# Patient Record
Sex: Male | Born: 1950 | Race: White | Hispanic: No | Marital: Married | Smoking: Never smoker
Health system: Southern US, Community
[De-identification: ages and names within clinical notes are randomized; demographics above are authoritative.]

## PROBLEM LIST (undated history)

## (undated) DIAGNOSIS — N320 Bladder-neck obstruction: Secondary | ICD-10-CM

## (undated) DIAGNOSIS — M1990 Unspecified osteoarthritis, unspecified site: Secondary | ICD-10-CM

## (undated) DIAGNOSIS — R399 Unspecified symptoms and signs involving the genitourinary system: Secondary | ICD-10-CM

## (undated) DIAGNOSIS — R972 Elevated prostate specific antigen [PSA]: Secondary | ICD-10-CM

## (undated) DIAGNOSIS — Z8551 Personal history of malignant neoplasm of bladder: Secondary | ICD-10-CM

## (undated) DIAGNOSIS — Z77090 Contact with and (suspected) exposure to asbestos: Secondary | ICD-10-CM

## (undated) DIAGNOSIS — I4510 Unspecified right bundle-branch block: Secondary | ICD-10-CM

## (undated) DIAGNOSIS — N400 Enlarged prostate without lower urinary tract symptoms: Secondary | ICD-10-CM

## (undated) DIAGNOSIS — I10 Essential (primary) hypertension: Secondary | ICD-10-CM

## (undated) DIAGNOSIS — K219 Gastro-esophageal reflux disease without esophagitis: Secondary | ICD-10-CM

## (undated) HISTORY — PX: ULNAR TUNNEL RELEASE: SHX820

## (undated) HISTORY — PX: TONSILLECTOMY: SUR1361

## (undated) HISTORY — PX: APPENDECTOMY: SHX54

---

## 1997-11-03 ENCOUNTER — Ambulatory Visit (HOSPITAL_COMMUNITY): Admission: RE | Admit: 1997-11-03 | Discharge: 1997-11-03 | Payer: Self-pay | Admitting: Internal Medicine

## 2004-12-14 ENCOUNTER — Ambulatory Visit (HOSPITAL_COMMUNITY): Admission: RE | Admit: 2004-12-14 | Discharge: 2004-12-14 | Payer: Self-pay | Admitting: Orthopedic Surgery

## 2005-11-14 ENCOUNTER — Ambulatory Visit: Payer: Self-pay | Admitting: Gastroenterology

## 2005-11-29 ENCOUNTER — Ambulatory Visit: Payer: Self-pay | Admitting: Gastroenterology

## 2008-07-01 ENCOUNTER — Emergency Department (HOSPITAL_COMMUNITY): Admission: EM | Admit: 2008-07-01 | Discharge: 2008-07-01 | Payer: Self-pay | Admitting: Emergency Medicine

## 2010-01-18 ENCOUNTER — Ambulatory Visit: Payer: Self-pay | Admitting: Oncology

## 2010-12-08 ENCOUNTER — Other Ambulatory Visit: Payer: Self-pay | Admitting: Orthopedic Surgery

## 2010-12-08 DIAGNOSIS — M25521 Pain in right elbow: Secondary | ICD-10-CM

## 2010-12-10 ENCOUNTER — Other Ambulatory Visit: Payer: Self-pay

## 2010-12-12 ENCOUNTER — Ambulatory Visit
Admission: RE | Admit: 2010-12-12 | Discharge: 2010-12-12 | Disposition: A | Discharge status: Home / Self Care | Payer: 59 | Source: Ambulatory Visit | Attending: Orthopedic Surgery | Admitting: Orthopedic Surgery

## 2010-12-12 DIAGNOSIS — M25521 Pain in right elbow: Secondary | ICD-10-CM

## 2010-12-12 MED ORDER — GADOBENATE DIMEGLUMINE 529 MG/ML IV SOLN
16.0000 mL | Freq: Once | INTRAVENOUS | Status: AC | PRN
  Administered 2010-12-12: 16 mL via INTRAVENOUS

## 2010-12-29 ENCOUNTER — Ambulatory Visit: Payer: 59 | Admitting: Psychology

## 2011-05-23 HISTORY — PX: WRIST SURGERY: SHX841

## 2011-09-22 ENCOUNTER — Ambulatory Visit (INDEPENDENT_AMBULATORY_CARE_PROVIDER_SITE_OTHER): Payer: 59 | Admitting: Urology

## 2011-09-22 DIAGNOSIS — Z8551 Personal history of malignant neoplasm of bladder: Secondary | ICD-10-CM

## 2011-09-22 DIAGNOSIS — R972 Elevated prostate specific antigen [PSA]: Secondary | ICD-10-CM

## 2011-10-20 ENCOUNTER — Ambulatory Visit: Payer: 59 | Admitting: Urology

## 2012-01-09 ENCOUNTER — Other Ambulatory Visit (HOSPITAL_COMMUNITY): Payer: Self-pay

## 2012-01-09 ENCOUNTER — Other Ambulatory Visit: Payer: Self-pay | Admitting: Urology

## 2012-01-09 DIAGNOSIS — C679 Malignant neoplasm of bladder, unspecified: Secondary | ICD-10-CM

## 2012-01-11 ENCOUNTER — Ambulatory Visit (HOSPITAL_COMMUNITY)
Admission: RE | Admit: 2012-01-11 | Discharge: 2012-01-11 | Disposition: A | Discharge status: Home / Self Care | Payer: 59 | Source: Ambulatory Visit | Attending: Urology | Admitting: Urology

## 2012-01-11 DIAGNOSIS — N400 Enlarged prostate without lower urinary tract symptoms: Secondary | ICD-10-CM | POA: Insufficient documentation

## 2012-01-11 DIAGNOSIS — C679 Malignant neoplasm of bladder, unspecified: Secondary | ICD-10-CM | POA: Insufficient documentation

## 2012-01-11 MED ORDER — IOHEXOL 300 MG/ML  SOLN
100.0000 mL | Freq: Once | INTRAMUSCULAR | Status: AC | PRN
  Administered 2012-01-11: 100 mL via INTRAVENOUS

## 2012-01-26 ENCOUNTER — Ambulatory Visit (INDEPENDENT_AMBULATORY_CARE_PROVIDER_SITE_OTHER): Payer: 59 | Admitting: Urology

## 2012-01-26 DIAGNOSIS — Z8551 Personal history of malignant neoplasm of bladder: Secondary | ICD-10-CM

## 2012-01-26 DIAGNOSIS — N529 Male erectile dysfunction, unspecified: Secondary | ICD-10-CM

## 2012-01-26 DIAGNOSIS — R972 Elevated prostate specific antigen [PSA]: Secondary | ICD-10-CM

## 2012-03-25 ENCOUNTER — Emergency Department (HOSPITAL_COMMUNITY)
Admission: EM | Admit: 2012-03-25 | Discharge: 2012-03-25 | Disposition: A | Discharge status: Home / Self Care | Payer: 59 | Attending: Emergency Medicine | Admitting: Emergency Medicine

## 2012-03-25 ENCOUNTER — Encounter (HOSPITAL_COMMUNITY): Payer: Self-pay | Admitting: Neurology

## 2012-03-25 DIAGNOSIS — Y929 Unspecified place or not applicable: Secondary | ICD-10-CM | POA: Insufficient documentation

## 2012-03-25 DIAGNOSIS — L0291 Cutaneous abscess, unspecified: Secondary | ICD-10-CM

## 2012-03-25 DIAGNOSIS — Y939 Activity, unspecified: Secondary | ICD-10-CM | POA: Insufficient documentation

## 2012-03-25 DIAGNOSIS — S51809A Unspecified open wound of unspecified forearm, initial encounter: Secondary | ICD-10-CM | POA: Insufficient documentation

## 2012-03-25 DIAGNOSIS — Z8614 Personal history of Methicillin resistant Staphylococcus aureus infection: Secondary | ICD-10-CM | POA: Insufficient documentation

## 2012-03-25 DIAGNOSIS — IMO0002 Reserved for concepts with insufficient information to code with codable children: Secondary | ICD-10-CM | POA: Insufficient documentation

## 2012-03-25 DIAGNOSIS — Y33XXXA Other specified events, undetermined intent, initial encounter: Secondary | ICD-10-CM | POA: Insufficient documentation

## 2012-03-25 DIAGNOSIS — Y93A9 Activity, other involving cardiorespiratory exercise: Secondary | ICD-10-CM | POA: Insufficient documentation

## 2012-03-25 MED ORDER — VANCOMYCIN HCL IN DEXTROSE 1-5 GM/200ML-% IV SOLN
1000.0000 mg | Freq: Once | INTRAVENOUS | Status: AC
  Administered 2012-03-25: 1000 mg via INTRAVENOUS
  Filled 2012-03-25: qty 200

## 2012-03-25 MED ORDER — CLINDAMYCIN HCL 150 MG PO CAPS: 300.0000 mg | ORAL_CAPSULE | Freq: Three times a day (TID) | ORAL | Status: DC

## 2012-03-25 MED ORDER — HYDROCODONE-ACETAMINOPHEN 5-500 MG PO TABS: 1.0000 | ORAL_TABLET | Freq: Four times a day (QID) | ORAL | Status: DC | PRN

## 2012-03-25 MED ORDER — SODIUM CHLORIDE 0.9 % IV SOLN
INTRAVENOUS | Status: DC
  Administered 2012-03-25: 10 mL via INTRAVENOUS

## 2012-03-25 NOTE — ED Notes (Signed)
Pt had boil lanced Friday to right forearm. Area is red, raised. Painful to touch. Reports "throbbing". Sensation intact. Area wrapped with gauze, drainage is red/yellow.

## 2012-03-25 NOTE — ED Provider Notes (Addendum)
History  This chart was scribed for Suzi Roots, MD by Shari Heritage. The patient was seen in room TR07C/TR07C. Patient's care was started at 0915.     CSN: [id number]  Arrival date & time 03/25/12  0908   First MD Initiated Contact with Patient 03/25/12 0915      Chief Complaint  Patient presents with  . arm infection      The history is provided by the patient. No language interpreter was used.   HPI Comments: Joshua Bowers is a 61 y.o. male who presents to the Emergency Department complaining of moderately, painful boils to the right forearm, right axilla and left lateral thigh. Patient reports fever. No chills, nausea or vomiting. Patient says that the boil on his forearm developed first, and he later noticed "bumps" under his right arm and on his left thigh. Patient states that he saw Dr. Sherryll Burger in [place] last week who diagnosed him with MRSA and placed him on Doxycycline which he has been taking for since Wednesday. On Friday, Dr. Sherryll Burger incised the boil to the right forearm, but patient feels that the area hasn't improved. Patient reports no other significant past medical history.  Hx mrsa, recurrent boils/abscesses. Pt does not feel ill. No nv. Pain to areas constant, dull, moderate, worse w palpation.   No past medical history on file.  No past surgical history on file.  No family history on file.  History  Substance Use Topics  . Smoking status: Not on file  . Smokeless tobacco: Not on file  . Alcohol Use: Not on file      Review of Systems  Constitutional: Positive for fever. Negative for chills.  Gastrointestinal: Negative for nausea and vomiting.  Skin: Positive for wound.  All other systems reviewed and are negative.    Allergies  Review of patient's allergies indicates not on file.  Home Medications  No current outpatient prescriptions on file.  BP 187/109  Pulse 78  Temp 98.3 F (36.8 C) (Oral)  Resp 16  SpO2 100%  Physical Exam  Nursing note  and vitals reviewed. Constitutional: He is oriented to person, place, and time. He appears well-developed and well-nourished.  HENT:  Head: Normocephalic and atraumatic.  Eyes: Conjunctivae normal are normal.  Neck: Neck supple.  Cardiovascular: Normal rate.   Pulmonary/Chest: Effort normal.  Abdominal: Soft. He exhibits no distension. There is no tenderness.  Musculoskeletal: Normal range of motion. He exhibits no edema.       See skin exam.  Neurological: He is alert and oriented to person, place, and time.  Skin: Skin is warm and dry.       2 cm diameter abscess right axilla. 5 cm diameter abscess dorsum right forearm. Surrounding erythema. Radial pulse 2+. No lymphangitis.  Psychiatric: He has a normal mood and affect. His behavior is normal.    ED Course  Procedures (including critical care time) DIAGNOSTIC STUDIES: Oxygen Saturation is 100% on room air, normal by my interpretation.    COORDINATION OF CARE: 9:35am- Patient informed of current plan for treatment and evaluation and agrees with plan at this time.       MDM  I personally performed the services described in this documentation, which was scribed in my presence. The recorded information has been reviewed and considered. Suzi Roots, MD  2 abscess requiring I and D, one right axilla, other right forearm.   Vancomycin iv.   INCISION AND DRAINAGE Performed by: Suzi Roots Consent: Verbal consent  obtained. Risks and benefits: risks, benefits and alternatives were discussed Type: abscess  Body area: right axilla and forearm  Anesthesia: local infiltration  Local anesthetic: lidocaine 2% w epinephrine  Anesthetic total: 8 ml  Complexity: complex. scalpel used to make incision/I and D.  Blunt dissection to break up loculations  Drainage: purulent  Drainage amount: moderate  Packing material: 1/4 in iodoform gauze  Patient tolerance: Patient tolerated the procedure well with no immediate  complications.   Sterile dressing.  Pt drove self to ed.   Will add clinda to abx coverage.   Recheck 2 days.         Suzi Roots, MD 03/25/12 1028  Suzi Roots, MD 04/15/12 [phone]

## 2012-03-25 NOTE — ED Notes (Signed)
Pt reporting on Friday had boil lanced on right forearm. Since then site is red, raised, draining yellow pus. Painful, throbbing.

## 2014-02-06 ENCOUNTER — Ambulatory Visit (INDEPENDENT_AMBULATORY_CARE_PROVIDER_SITE_OTHER): Payer: 59 | Admitting: Urology

## 2014-02-06 DIAGNOSIS — Z8551 Personal history of malignant neoplasm of bladder: Secondary | ICD-10-CM

## 2014-02-06 DIAGNOSIS — N529 Male erectile dysfunction, unspecified: Secondary | ICD-10-CM

## 2014-04-10 ENCOUNTER — Ambulatory Visit (INDEPENDENT_AMBULATORY_CARE_PROVIDER_SITE_OTHER): Payer: 59 | Admitting: Urology

## 2014-04-10 ENCOUNTER — Other Ambulatory Visit: Payer: Self-pay | Admitting: Urology

## 2014-04-10 DIAGNOSIS — N5201 Erectile dysfunction due to arterial insufficiency: Secondary | ICD-10-CM

## 2014-04-10 DIAGNOSIS — R972 Elevated prostate specific antigen [PSA]: Secondary | ICD-10-CM

## 2014-04-10 DIAGNOSIS — R351 Nocturia: Secondary | ICD-10-CM

## 2014-04-10 DIAGNOSIS — N401 Enlarged prostate with lower urinary tract symptoms: Secondary | ICD-10-CM

## 2014-04-29 ENCOUNTER — Ambulatory Visit (HOSPITAL_COMMUNITY)
Admission: RE | Admit: 2014-04-29 | Discharge: 2014-04-29 | Disposition: A | Discharge status: Home / Self Care | Payer: 59 | Source: Ambulatory Visit | Attending: Urology | Admitting: Urology

## 2014-04-29 ENCOUNTER — Other Ambulatory Visit (HOSPITAL_COMMUNITY): Payer: 59

## 2014-04-29 DIAGNOSIS — N400 Enlarged prostate without lower urinary tract symptoms: Secondary | ICD-10-CM | POA: Insufficient documentation

## 2014-04-29 DIAGNOSIS — Z8551 Personal history of malignant neoplasm of bladder: Secondary | ICD-10-CM | POA: Diagnosis not present

## 2014-04-29 DIAGNOSIS — R972 Elevated prostate specific antigen [PSA]: Secondary | ICD-10-CM

## 2014-04-29 LAB — POCT I-STAT, CHEM 8
BUN: 17 mg/dL (ref 6–23)
Calcium, Ion: 1.15 mmol/L (ref 1.13–1.30)
Chloride: 102 mEq/L (ref 96–112)
Creatinine, Ser: 1.1 mg/dL (ref 0.50–1.35)
Glucose, Bld: 96 mg/dL (ref 70–99)
HCT: 51 % (ref 39.0–52.0)
Hemoglobin: 17.3 g/dL — ABNORMAL HIGH (ref 13.0–17.0)
Potassium: 4.2 mEq/L (ref 3.7–5.3)
Sodium: 138 mEq/L (ref 137–147)
TCO2: 27 mmol/L (ref 0–100)

## 2014-04-29 MED ORDER — GADOBENATE DIMEGLUMINE 529 MG/ML IV SOLN
17.0000 mL | Freq: Once | INTRAVENOUS | Status: AC | PRN
  Administered 2014-04-29: 17 mL via INTRAVENOUS

## 2014-05-01 ENCOUNTER — Ambulatory Visit (INDEPENDENT_AMBULATORY_CARE_PROVIDER_SITE_OTHER): Payer: 59 | Admitting: Urology

## 2014-05-01 DIAGNOSIS — N401 Enlarged prostate with lower urinary tract symptoms: Secondary | ICD-10-CM

## 2014-05-01 DIAGNOSIS — R972 Elevated prostate specific antigen [PSA]: Secondary | ICD-10-CM

## 2014-05-01 DIAGNOSIS — R3912 Poor urinary stream: Secondary | ICD-10-CM

## 2014-05-01 DIAGNOSIS — N5201 Erectile dysfunction due to arterial insufficiency: Secondary | ICD-10-CM

## 2015-08-13 ENCOUNTER — Ambulatory Visit (INDEPENDENT_AMBULATORY_CARE_PROVIDER_SITE_OTHER): Payer: 59 | Admitting: Urology

## 2015-08-13 DIAGNOSIS — K4090 Unilateral inguinal hernia, without obstruction or gangrene, not specified as recurrent: Secondary | ICD-10-CM | POA: Diagnosis not present

## 2015-08-13 DIAGNOSIS — N5201 Erectile dysfunction due to arterial insufficiency: Secondary | ICD-10-CM

## 2015-08-13 DIAGNOSIS — R972 Elevated prostate specific antigen [PSA]: Secondary | ICD-10-CM

## 2015-08-13 DIAGNOSIS — R351 Nocturia: Secondary | ICD-10-CM

## 2015-08-13 DIAGNOSIS — N401 Enlarged prostate with lower urinary tract symptoms: Secondary | ICD-10-CM | POA: Diagnosis not present

## 2015-11-02 ENCOUNTER — Encounter: Payer: Self-pay | Admitting: Gastroenterology

## 2015-12-01 ENCOUNTER — Other Ambulatory Visit: Payer: Self-pay | Admitting: Surgery

## 2015-12-21 ENCOUNTER — Encounter (HOSPITAL_BASED_OUTPATIENT_CLINIC_OR_DEPARTMENT_OTHER): Payer: Self-pay | Admitting: *Deleted

## 2015-12-24 ENCOUNTER — Other Ambulatory Visit: Payer: Self-pay

## 2015-12-24 ENCOUNTER — Encounter (HOSPITAL_COMMUNITY)
Admission: RE | Admit: 2015-12-24 | Discharge: 2015-12-24 | Disposition: A | Discharge status: Home / Self Care | Payer: 59 | Source: Ambulatory Visit | Attending: Surgery | Admitting: Surgery

## 2015-12-24 NOTE — Progress Notes (Signed)
ekg reviewed by dr rob fitzgerald no further treatment need

## 2015-12-26 NOTE — H&P (Deleted)
  Joshua Bowers 12/17/2015 8:59 AM Location: [facility] Patient #: [id number] DOB: 02/12/1957 Married / Language: English / Race: White Male   History of Present Illness (Joshua Bowers A. Ninfa Linden MD; 12/17/2015 9:06 AM) The patient is a 65 year old male who presents with breast cancer. She is here for another long-term follow-up regarding her right breast cancer. She is now completed her radiation. She is doing very well and was no complaints.   Allergies Joshua Bowers, CMA; 12/17/2015 9:00 AM) Codeine Sulfate *ANALGESICS - OPIOID* Sulfamethoxazole *Sulfonamides  Medication History Joshua Bowers, CMA; 12/17/2015 9:00 AM) TraZODone HCl (50MG  Tablet, Oral) Active. Gabapentin (300MG  Capsule, Oral) Active. Lexapro (20MG  Tablet, Oral) Active. Atorvastatin Calcium (40MG  Tablet, Oral) Active. Benicar HCT (40-25MG  Tablet, Oral) Active. Escitalopram Oxalate (10MG  Tablet, Oral) Active. Estradiol (0.05MG /24HR Patch TW, Transdermal) Active. Levothyroxine Sodium (137MCG Tablet, Oral) Active. Progesterone Micronized (100MG  Capsule, Oral) Active. Fish Oil (1200MG  Capsule, Oral) Active. Medications Reconciled  Vitals Joshua Bowers CMA; 12/17/2015 9:01 AM) 12/17/2015 9:00 AM Weight: 218 lb Height: 68in Body Surface Area: 2.12 m Body Mass Index: 33.15 kg/m  Temp.: 98.58F(Temporal)  Pulse: 72 (Regular)  BP: 126/74 (Sitting, Left Arm, Standard)       Physical Exam (Joshua Bowers A. Ninfa Linden MD; 12/17/2015 9:06 AM) The physical exam findings are as follows: Note:On examination of the right breast, there is still mild erythema from the radiation. The Port-A-Cath site is well-healed    Assessment & Plan (Mykenzi Vanzile A. Ninfa Linden MD; 12/17/2015 9:06 AM) BREAST CANCER, STAGE 1, RIGHT (C50.911) Impression: As she has completed her radiation therapy, I will now schedule her for Port-A-Cath removal. I discussed the procedure with her in detail and she is eager to proceed.  Surgery will be scheduled

## 2015-12-27 ENCOUNTER — Encounter (HOSPITAL_BASED_OUTPATIENT_CLINIC_OR_DEPARTMENT_OTHER): Payer: Self-pay | Admitting: Anesthesiology

## 2015-12-27 ENCOUNTER — Ambulatory Visit (HOSPITAL_BASED_OUTPATIENT_CLINIC_OR_DEPARTMENT_OTHER)
Admission: RE | Admit: 2015-12-27 | Discharge: 2015-12-28 | Disposition: A | Discharge status: Home / Self Care | Payer: 59 | Source: Ambulatory Visit | Attending: Surgery | Admitting: Surgery

## 2015-12-27 ENCOUNTER — Ambulatory Visit (HOSPITAL_BASED_OUTPATIENT_CLINIC_OR_DEPARTMENT_OTHER): Payer: 59 | Admitting: Anesthesiology

## 2015-12-27 ENCOUNTER — Encounter (HOSPITAL_BASED_OUTPATIENT_CLINIC_OR_DEPARTMENT_OTHER)
Admission: RE | Disposition: A | Discharge status: Home / Self Care | Payer: Self-pay | Source: Ambulatory Visit | Attending: Surgery

## 2015-12-27 DIAGNOSIS — Z9889 Other specified postprocedural states: Secondary | ICD-10-CM

## 2015-12-27 DIAGNOSIS — I10 Essential (primary) hypertension: Secondary | ICD-10-CM | POA: Insufficient documentation

## 2015-12-27 DIAGNOSIS — Z8719 Personal history of other diseases of the digestive system: Secondary | ICD-10-CM

## 2015-12-27 DIAGNOSIS — K4090 Unilateral inguinal hernia, without obstruction or gangrene, not specified as recurrent: Secondary | ICD-10-CM | POA: Diagnosis present

## 2015-12-27 HISTORY — PX: INSERTION OF MESH: SHX5868

## 2015-12-27 HISTORY — PX: INGUINAL HERNIA REPAIR: SHX194

## 2015-12-27 HISTORY — DX: Essential (primary) hypertension: I10

## 2015-12-27 SURGERY — REPAIR, HERNIA, INGUINAL, ADULT
Anesthesia: General | Laterality: Right

## 2015-12-27 MED ORDER — FENTANYL CITRATE (PF) 100 MCG/2ML IJ SOLN
50.0000 ug | INTRAMUSCULAR | Status: DC | PRN
  Administered 2015-12-27: 50 ug via INTRAVENOUS

## 2015-12-27 MED ORDER — LACTATED RINGERS IV SOLN
INTRAVENOUS | Status: DC | PRN
  Administered 2015-12-27 (×2): via INTRAVENOUS

## 2015-12-27 MED ORDER — ONDANSETRON HCL 4 MG/2ML IJ SOLN
INTRAMUSCULAR | Status: AC
  Filled 2015-12-27: qty 2

## 2015-12-27 MED ORDER — CHLORHEXIDINE GLUCONATE CLOTH 2 % EX PADS: 6.0000 | MEDICATED_PAD | Freq: Once | CUTANEOUS | Status: DC

## 2015-12-27 MED ORDER — BUPIVACAINE HCL (PF) 0.5 % IJ SOLN
INTRAMUSCULAR | Status: AC
  Filled 2015-12-27: qty 30

## 2015-12-27 MED ORDER — LIDOCAINE HCL (CARDIAC) 20 MG/ML IV SOLN
INTRAVENOUS | Status: DC | PRN
  Administered 2015-12-27: 50 mg via INTRAVENOUS

## 2015-12-27 MED ORDER — FENTANYL CITRATE (PF) 100 MCG/2ML IJ SOLN
INTRAMUSCULAR | Status: DC | PRN
  Administered 2015-12-27: 100 ug via INTRAVENOUS

## 2015-12-27 MED ORDER — DEXAMETHASONE SODIUM PHOSPHATE 10 MG/ML IJ SOLN
INTRAMUSCULAR | Status: AC
  Filled 2015-12-27: qty 1

## 2015-12-27 MED ORDER — PROMETHAZINE HCL 25 MG/ML IJ SOLN
6.2500 mg | INTRAMUSCULAR | Status: DC | PRN
Start: 2015-12-27 — End: 2015-12-28

## 2015-12-27 MED ORDER — OXYCODONE HCL 5 MG PO TABS
ORAL_TABLET | ORAL | Status: AC
  Filled 2015-12-27: qty 1

## 2015-12-27 MED ORDER — FENTANYL CITRATE (PF) 100 MCG/2ML IJ SOLN
INTRAMUSCULAR | Status: AC
  Filled 2015-12-27: qty 2

## 2015-12-27 MED ORDER — GLYCOPYRROLATE 0.2 MG/ML IJ SOLN
INTRAMUSCULAR | Status: DC | PRN
  Administered 2015-12-27: 0.2 mg via INTRAVENOUS

## 2015-12-27 MED ORDER — ROCURONIUM BROMIDE 10 MG/ML (PF) SYRINGE
PREFILLED_SYRINGE | INTRAVENOUS | Status: AC
  Filled 2015-12-27: qty 10

## 2015-12-27 MED ORDER — MIDAZOLAM HCL 2 MG/2ML IJ SOLN
INTRAMUSCULAR | Status: AC
  Filled 2015-12-27: qty 2

## 2015-12-27 MED ORDER — MORPHINE SULFATE (PF) 2 MG/ML IV SOLN: 1.0000 mg | INTRAVENOUS | Status: DC | PRN

## 2015-12-27 MED ORDER — LIDOCAINE 2% (20 MG/ML) 5 ML SYRINGE
INTRAMUSCULAR | Status: AC
  Filled 2015-12-27: qty 5

## 2015-12-27 MED ORDER — SUGAMMADEX SODIUM 200 MG/2ML IV SOLN
INTRAVENOUS | Status: AC
  Filled 2015-12-27: qty 2

## 2015-12-27 MED ORDER — SODIUM CHLORIDE 0.9 % IV SOLN
250.0000 mL | INTRAVENOUS | Status: DC | PRN
Start: 2015-12-27 — End: 2015-12-28

## 2015-12-27 MED ORDER — SODIUM CHLORIDE FLUSH 0.9 % IV SOLN
INTRAVENOUS | Status: AC
  Filled 2015-12-27: qty 10

## 2015-12-27 MED ORDER — TAMSULOSIN HCL 0.4 MG PO CAPS
0.4000 mg | ORAL_CAPSULE | Freq: Every day | ORAL | Status: AC
  Administered 2015-12-27: 0.4 mg via ORAL
  Filled 2015-12-27: qty 1

## 2015-12-27 MED ORDER — BUPIVACAINE-EPINEPHRINE (PF) 0.5% -1:200000 IJ SOLN
INTRAMUSCULAR | Status: AC
  Filled 2015-12-27: qty 30

## 2015-12-27 MED ORDER — BUPIVACAINE-EPINEPHRINE (PF) 0.5% -1:200000 IJ SOLN
INTRAMUSCULAR | Status: DC | PRN
  Administered 2015-12-27: 30 mL via PERINEURAL

## 2015-12-27 MED ORDER — ACETAMINOPHEN 650 MG RE SUPP: 650.0000 mg | RECTAL | Status: DC | PRN

## 2015-12-27 MED ORDER — SODIUM CHLORIDE 0.9% FLUSH
3.0000 mL | Freq: Two times a day (BID) | INTRAVENOUS | Status: DC
  Administered 2015-12-27: 3 mL via INTRAVENOUS

## 2015-12-27 MED ORDER — SCOPOLAMINE 1 MG/3DAYS TD PT72: 1.0000 | MEDICATED_PATCH | Freq: Once | TRANSDERMAL | Status: DC | PRN

## 2015-12-27 MED ORDER — HYDROCODONE-ACETAMINOPHEN 5-325 MG PO TABS: 1.0000 | ORAL_TABLET | ORAL | Status: DC | PRN

## 2015-12-27 MED ORDER — SODIUM CHLORIDE 0.9% FLUSH: 3.0000 mL | INTRAVENOUS | Status: DC | PRN

## 2015-12-27 MED ORDER — OXYCODONE HCL 5 MG PO TABS
5.0000 mg | ORAL_TABLET | ORAL | Status: DC | PRN
  Administered 2015-12-27: 5 mg via ORAL
  Administered 2015-12-27 – 2015-12-28 (×2): 10 mg via ORAL
  Filled 2015-12-27 (×2): qty 2

## 2015-12-27 MED ORDER — LACTATED RINGERS IV SOLN
INTRAVENOUS | Status: DC
  Administered 2015-12-27: 14:00:00 via INTRAVENOUS

## 2015-12-27 MED ORDER — HYDROCODONE-ACETAMINOPHEN 5-325 MG PO TABS: 1.0000 | ORAL_TABLET | ORAL | 0 refills | Status: DC | PRN

## 2015-12-27 MED ORDER — BUPIVACAINE HCL (PF) 0.5 % IJ SOLN
INTRAMUSCULAR | Status: DC | PRN
  Administered 2015-12-27: 10 mL

## 2015-12-27 MED ORDER — MIDAZOLAM HCL 2 MG/2ML IJ SOLN
1.0000 mg | INTRAMUSCULAR | Status: DC | PRN
  Administered 2015-12-27: 2 mg via INTRAVENOUS

## 2015-12-27 MED ORDER — ACETAMINOPHEN 325 MG PO TABS: 650.0000 mg | ORAL_TABLET | ORAL | Status: DC | PRN

## 2015-12-27 MED ORDER — GLYCOPYRROLATE 0.2 MG/ML IJ SOLN: 0.2000 mg | Freq: Once | INTRAMUSCULAR | Status: DC | PRN

## 2015-12-27 MED ORDER — GLYCOPYRROLATE 0.2 MG/ML IV SOSY
PREFILLED_SYRINGE | INTRAVENOUS | Status: AC
  Filled 2015-12-27: qty 3

## 2015-12-27 MED ORDER — MIDAZOLAM HCL 5 MG/5ML IJ SOLN
INTRAMUSCULAR | Status: DC | PRN
  Administered 2015-12-27: 2 mg via INTRAVENOUS

## 2015-12-27 MED ORDER — DEXAMETHASONE SODIUM PHOSPHATE 4 MG/ML IJ SOLN
INTRAMUSCULAR | Status: DC | PRN
  Administered 2015-12-27: 10 mg via INTRAVENOUS

## 2015-12-27 MED ORDER — FENTANYL CITRATE (PF) 100 MCG/2ML IJ SOLN
25.0000 ug | INTRAMUSCULAR | Status: DC | PRN
  Administered 2015-12-27 (×3): 50 ug via INTRAVENOUS

## 2015-12-27 MED ORDER — PROPOFOL 10 MG/ML IV BOLUS
INTRAVENOUS | Status: DC | PRN
  Administered 2015-12-27: 200 mg via INTRAVENOUS

## 2015-12-27 MED ORDER — PROPOFOL 10 MG/ML IV BOLUS
INTRAVENOUS | Status: AC
  Filled 2015-12-27: qty 20

## 2015-12-27 MED ORDER — CEFAZOLIN SODIUM-DEXTROSE 2-4 GM/100ML-% IV SOLN
2.0000 g | INTRAVENOUS | Status: AC
  Administered 2015-12-27: 2 g via INTRAVENOUS

## 2015-12-27 MED ORDER — CEFAZOLIN SODIUM-DEXTROSE 2-4 GM/100ML-% IV SOLN
INTRAVENOUS | Status: AC
  Filled 2015-12-27: qty 100

## 2015-12-27 SURGICAL SUPPLY — 42 items
BLADE CLIPPER SURG (BLADE) ×2 IMPLANT
BLADE HEX COATED 2.75 (ELECTRODE) ×2 IMPLANT
BLADE SURG 15 STRL LF DISP TIS (BLADE) ×1 IMPLANT
BLADE SURG 15 STRL SS (BLADE) ×2
CANISTER SUCTION 1200CC (MISCELLANEOUS) IMPLANT
CHLORAPREP W/TINT 26ML (MISCELLANEOUS) ×2 IMPLANT
COVER BACK TABLE 60X90IN (DRAPES) ×2 IMPLANT
COVER MAYO STAND STRL (DRAPES) ×2 IMPLANT
DECANTER SPIKE VIAL GLASS SM (MISCELLANEOUS) IMPLANT
DRAIN PENROSE 1/2X12 LTX STRL (WOUND CARE) ×2 IMPLANT
DRAPE LAPAROTOMY 100X72 PEDS (DRAPES) ×2 IMPLANT
DRAPE UTILITY XL STRL (DRAPES) ×2 IMPLANT
ELECT REM PT RETURN 9FT ADLT (ELECTROSURGICAL) ×2
ELECTRODE REM PT RTRN 9FT ADLT (ELECTROSURGICAL) ×1 IMPLANT
GLOVE SURG SIGNA 7.5 PF LTX (GLOVE) ×2 IMPLANT
GOWN STRL REUS W/ TWL LRG LVL3 (GOWN DISPOSABLE) ×1 IMPLANT
GOWN STRL REUS W/ TWL XL LVL3 (GOWN DISPOSABLE) ×1 IMPLANT
GOWN STRL REUS W/TWL LRG LVL3 (GOWN DISPOSABLE) ×2
GOWN STRL REUS W/TWL XL LVL3 (GOWN DISPOSABLE) ×2
LIQUID BAND (GAUZE/BANDAGES/DRESSINGS) ×2 IMPLANT
MESH PARIETEX PROGRIP RIGHT (Mesh General) ×1 IMPLANT
NDL HYPO 25X1 1.5 SAFETY (NEEDLE) ×1 IMPLANT
NEEDLE HYPO 25X1 1.5 SAFETY (NEEDLE) ×2 IMPLANT
NS IRRIG 1000ML POUR BTL (IV SOLUTION) IMPLANT
PACK BASIN DAY SURGERY FS (CUSTOM PROCEDURE TRAY) ×2 IMPLANT
PENCIL BUTTON HOLSTER BLD 10FT (ELECTRODE) ×2 IMPLANT
SLEEVE SCD COMPRESS KNEE MED (MISCELLANEOUS) ×2 IMPLANT
SPONGE INTESTINAL PEANUT (DISPOSABLE) IMPLANT
SPONGE LAP 4X18 X RAY DECT (DISPOSABLE) ×2 IMPLANT
SUT MNCRL AB 4-0 PS2 18 (SUTURE) ×2 IMPLANT
SUT SILK 2 0 SH (SUTURE) ×1 IMPLANT
SUT SURG 0 T 19/GS 22 1969 62 (SUTURE) IMPLANT
SUT VIC AB 2-0 CT1 27 (SUTURE) ×4
SUT VIC AB 2-0 CT1 TAPERPNT 27 (SUTURE) ×2 IMPLANT
SUT VIC AB 3-0 CT1 27 (SUTURE) ×2
SUT VIC AB 3-0 CT1 27XBRD (SUTURE) ×1 IMPLANT
SYR BULB 3OZ (MISCELLANEOUS) IMPLANT
SYR CONTROL 10ML LL (SYRINGE) ×2 IMPLANT
TOWEL OR 17X24 6PK STRL BLUE (TOWEL DISPOSABLE) ×2 IMPLANT
TOWEL OR NON WOVEN STRL DISP B (DISPOSABLE) ×2 IMPLANT
TUBE CONNECTING 20X1/4 (TUBING) IMPLANT
YANKAUER SUCT BULB TIP NO VENT (SUCTIONS) IMPLANT

## 2015-12-27 NOTE — Anesthesia Postprocedure Evaluation (Addendum)
Anesthesia Post Note  Patient: Joshua Bowers  Procedure(s) Performed: Procedure(s) (LRB): HERNIA REPAIR RIGHT INGUINAL ADULT WITH MESH (Right) INSERTION OF MESH (Right)  Patient location during evaluation: PACU Anesthesia Type: General and Regional Level of consciousness: awake and alert Pain management: pain level controlled Vital Signs Assessment: post-procedure vital signs reviewed and stable Respiratory status: spontaneous breathing, nonlabored ventilation, respiratory function stable and patient connected to nasal cannula oxygen Cardiovascular status: blood pressure returned to baseline and stable Postop Assessment: no signs of nausea or vomiting Anesthetic complications: yes Anesthetic complication details: anesthesia complicationsComments: Persistent right quadriceps weakness after surgery. Unable to support weight. Knee immobilizer placed but patient still feels unsafe. Discussed likely cause is from TAP block (known complication) and that this should wear off overnight.  Called Dr. Ninfa Linden and discussed event.  He will write admission orders and patient will stay until he feels safe to go home.  Reassured patient and wife.  Questions answered.    Last Vitals:  Vitals:   12/27/15 1545 12/27/15 1620  BP: (!) 146/92 (!) 148/76  Pulse: 77 70  Resp: 19 18  Temp:  36.4 C    Last Pain:  Vitals:   12/27/15 1620  TempSrc:   PainSc: 5                  Raynelle Fujikawa J

## 2015-12-27 NOTE — Op Note (Signed)
HERNIA REPAIR RIGHT INGUINAL ADULT WITH MESH, INSERTION OF MESH  Procedure Note  AYDRIEN KAHELE 12/27/2015   Pre-op Diagnosis: Right inguinal hernia      Post-op Diagnosis: same  Procedure(s): HERNIA REPAIR RIGHT INGUINAL ADULT WITH MESH INSERTION OF MESH  Surgeon(s): [name], MD  Anesthesia: General  Staff:  Circulator: Ted Mcalpine, RN Relief Scrub: Gaetano Net, RN Scrub Person: Mickie Bail, CST Circulator Assistant: Philomena Doheny, RN  Estimated Blood Loss: Minimal                         [name] A   Date: 12/27/2015  Time: 2:45 PM

## 2015-12-27 NOTE — Interval H&P Note (Deleted)
History and Physical Interval Note:no change in H and P  12/27/2015 12:54 PM  Joshua Bowers  has presented today for surgery, with the diagnosis of Right inguinal hernia   The various methods of treatment have been discussed with the patient and family. After consideration of risks, benefits and other options for treatment, the patient has consented to  Procedure(s): HERNIA REPAIR RIGHT INGUINAL ADULT WITH MESH (Right) INSERTION OF MESH (Right) as a surgical intervention .  The patient's history has been reviewed, patient examined, no change in status, stable for surgery.  I have reviewed the patient's chart and labs.  Questions were answered to the patient's satisfaction.     Sajid Ruppert A

## 2015-12-27 NOTE — Anesthesia Procedure Notes (Signed)
Anesthesia Regional Block:  TAP block  Pre-Anesthetic Checklist: ,, timeout performed, Correct Patient, Correct Site, Correct Laterality, Correct Procedure, Correct Position, site marked, Risks and benefits discussed,  Surgical consent,  Pre-op evaluation,  At surgeon's request and post-op pain management  Laterality: Right  Prep: chloraprep       Needles:  Injection technique: Single-shot  Needle Type: Echogenic Needle     Needle Length: 10cm 10 cm Needle Gauge: 21 and 21 G    Additional Needles:  Procedures: ultrasound guided (picture in chart) TAP block Narrative:  Injection made incrementally with aspirations every 5 mL.  Performed by: Personally  Anesthesiologist: Franne Grip  Additional Notes: Tolerated well

## 2015-12-27 NOTE — Discharge Instructions (Signed)
[facility] _______[facility]  UMBILICAL OR INGUINAL HERNIA REPAIR: POST OP INSTRUCTIONS  Always review your discharge instruction sheet given to you by the facility where your surgery was performed. IF YOU HAVE DISABILITY OR FAMILY LEAVE FORMS, YOU MUST BRING THEM TO THE OFFICE FOR PROCESSING.   DO NOT GIVE THEM TO YOUR DOCTOR.  1. A  prescription for pain medication may be given to you upon discharge.  Take your pain medication as prescribed, if needed.  If narcotic pain medicine is not needed, then you may take acetaminophen (Tylenol) or ibuprofen (Advil) as needed. 2. Take your usually prescribed medications unless otherwise directed. 3. If you need a refill on your pain medication, please contact your pharmacy.  They will contact our office to request authorization. Prescriptions will not be filled after 5 pm or on week-ends.   Post Anesthesia Home Care Instructions  Activity: Get plenty of rest for the remainder of the day. A responsible adult should stay with you for 24 hours following the procedure.  For the next 24 hours, DO NOT: -Drive a car -Advertising copywriterperate machinery -Drink alcoholic beverages -Take any medication unless instructed by your physician -Make any legal decisions or sign important papers.  Meals: Start with liquid foods such as gelatin or soup. Progress to regular foods as tolerated. Avoid greasy, spicy, heavy foods. If nausea and/or vomiting occur, drink only clear liquids until the nausea and/or vomiting subsides. Call your physician if vomiting continues.  Special Instructions/Symptoms: Your throat may feel dry or sore from the anesthesia or the breathing tube placed in your throat during surgery. If this causes discomfort, gargle with warm salt water. The discomfort should disappear within 24 hours.  If you had a scopolamine patch placed behind your ear for the management of post- operative nausea and/or vomiting:  1. The medication in the patch is  effective for 72 hours, after which it should be removed.  Wrap patch in a tissue and discard in the trash. Wash hands thoroughly with soap and water. 2. You may remove the patch earlier than 72 hours if you experience unpleasant side effects which may include dry mouth, dizziness or visual disturbances. 3. Avoid touching the patch. Wash your hands with soap and water after contact with the patch.    4. You should follow a light diet the first 24 hours after arrival home, such as soup and crackers, etc.  Be sure to include lots of fluids daily.  Resume your normal diet the day after surgery. 5. Most patients will experience some swelling and bruising around the umbilicus or in the groin and scrotum.  Ice packs and reclining will help.  Swelling and bruising can take several days to resolve.  6. It is common to experience some constipation if taking pain medication after surgery.  Increasing fluid intake and taking a stool softener (such as Colace) will usually help or prevent this problem from occurring.  A mild laxative (Milk of Magnesia or Miralax) should be taken according to package directions if there are no bowel movements after 48 hours. 7. Unless discharge instructions indicate otherwise, you may remove your bandages 24-48 hours after surgery, and you may shower at that time.  You may have steri-strips (small skin tapes) in place directly over the incision.  These strips should be left on the skin for 7-10 days.  If your surgeon used skin glue on the incision, you may shower in 24 hours.  The glue will flake off over the next 2-3  weeks.  Any sutures or staples will be removed at the office during your follow-up visit. 8. ACTIVITIES:  You may resume regular (light) daily activities beginning the next day--such as daily self-care, walking, climbing stairs--gradually increasing activities as tolerated.  You may have sexual intercourse when it is comfortable.  Refrain from any heavy lifting or straining  until approved by your doctor. a. You may drive when you are no longer taking prescription pain medication, you can comfortably wear a seatbelt, and you can safely maneuver your car and apply brakes. b. RETURN TO WORK:  __________________________________________________________ 9. You should see your doctor in the office for a follow-up appointment approximately 2-3 weeks after your surgery.  Make sure that you call for this appointment within a day or two after you arrive home to insure a convenient appointment time. 10. OTHER INSTRUCTIONS: NO LIFTING MORE THAN 15 POUNDS FOR 4 WEEKS 11. ICE PACK AND IBUPROFEN ALSO FOR PAIN __________________________________________________________________________________________________________________________________________________________________________________________  WHEN TO CALL YOUR DOCTOR: 1. Fever over 101.0 2. Inability to urinate 3. Nausea and/or vomiting 4. Extreme swelling or bruising 5. Continued bleeding from incision. 6. Increased pain, redness, or drainage from the incision  The clinic staff is available to answer your questions during regular business hours.  Please dont hesitate to call and ask to speak to one of the nurses for clinical concerns.  If you have a medical emergency, go to the nearest emergency room or call 911.  A surgeon from [facility] is always on call at the hospital   [address] [place], [place], [place]  [address] ?  [address], [place], [place]   [address] [phone] ? [phone] ? FAX [phone] Web site: [email or web address]

## 2015-12-27 NOTE — Anesthesia Procedure Notes (Signed)
Anesthesia Procedure Image    

## 2015-12-27 NOTE — Op Note (Signed)
[name], [name]               ACCOUNT NO.:  [id number]  MEDICAL RECORD NO.:  [id number]  LOCATION:  DOIB                          FACILITY:  [facility]  PHYSICIAN:  [name], M.D. DATE OF BIRTH:  Oct 16, 1950  DATE OF PROCEDURE:  12/27/2015 DATE OF DISCHARGE:  12/24/2015                              OPERATIVE REPORT   PREOPERATIVE DIAGNOSIS:  Right inguinal hernia.  POSTOPERATIVE DIAGNOSIS:  Right inguinal hernia.  PROCEDURE:  Right inguinal hernia repair with mesh.  SURGEON:  [name], M.D.  ANESTHESIA:  General with TAP block, provided by Anesthesia.  ESTIMATED BLOOD LOSS:  Minimal.  FINDINGS:  The patient was found to have an indirect right inguinal hernia, which was repaired with a piece of Proceed Prolene ProGrip mesh.  PROCEDURE IN DETAIL:  The patient was brought to the operating room, identified as Joshua Bowers.  He was placed supine on the operating table, general anesthesia was induced.  His abdomen, right lower quadrant prepped and draped in usual sterile fashion.  I anesthetized skin in the right lower quadrant with Marcaine.  I made a longitudinal incision in the groin with a scalpel.  I took this down through Scarpa's fascia with electrocautery.  The external oblique fascia was then identified and opened through the internal and external rings.  The patient had a large indirect hernia sac along the cord structures.  I was able to control the cord structures with a Penrose drain.  I was able to separate the cord from the hernia sac.  I dissected the hernia sac down to the base.  All contents were reduced.  I tied off the sac with a 2-0 silk suture.  I then excised the redundant sac with the cautery.  Next, a piece of ProGrip Prolene mesh was brought onto the field.  A right-sided large piece of mesh was used.  I placed it against the pubic tubercle and sutured in place with a 2-0 Vicryl suture.  I then brought it around the cord structures and  fastened in place.  Wide coverage of the inguinal floor and internal ring appeared to be achieved.  At this point, I then closed the external oblique fascia over the top of the mesh with a running 2-0 Vicryl suture.  Scarpa's fascia was then closed with interrupted 3-0 Vicryl sutures, and the skin was closed with a running 4-0 Monocryl.  Skin glue was then applied.  The patient tolerated the procedure well.  All sponge, needle, and instrument counts Were correct at the end of the procedure.  The patient was then extubated in the operating room and taken in stable condition to recovery room.     [name], M.D.     DB/MEDQ  D:  12/27/2015  T:  12/27/2015  Job:  [id number]

## 2015-12-27 NOTE — Progress Notes (Signed)
When attempting to get patient up to recliner her had weakness in right leg and had to sit back down on stretcher.  Tried once more using a walker for support and was still unable to walk.  Helped patient back to stretcher and called Dr. Delma Post.  He advised that it was probably due to the block and give it some more time.  Patient remained on stretcher and placed in PACU bay 12.  Wife at bedside.  Wife looked at abdomen with me and agreed that abdomen on right side looked "swollen".  Called Dr. Delma Post again.  He advised he will be by to see patient.

## 2015-12-27 NOTE — Progress Notes (Signed)
Assisted Dr. Delma Post with right, ultrasound guided, transabdominal plane block. Side rails up, monitors on throughout procedure. See vital signs in flow sheet. Tolerated Procedure well.

## 2015-12-27 NOTE — Transfer of Care (Signed)
Immediate Anesthesia Transfer of Care Note  Patient: Joshua Bowers  Procedure(s) Performed: Procedure(s): HERNIA REPAIR RIGHT INGUINAL ADULT WITH MESH (Right) INSERTION OF MESH (Right)  Patient Location: PACU  Anesthesia Type:GA combined with regional for post-op pain  Level of Consciousness: awake and patient cooperative  Airway & Oxygen Therapy: Patient Spontanous Breathing and Patient connected to face mask oxygen  Post-op Assessment: Report given to RN and Post -op Vital signs reviewed and stable  Post vital signs: Reviewed and stable  Last Vitals:  Vitals:   12/27/15 1355 12/27/15 1400  BP: (!) 141/87 (!) 142/84  Pulse: 68 61  Resp: 15   Temp:      Last Pain:  Vitals:   12/27/15 1322  TempSrc: Oral         Complications: No apparent anesthesia complications

## 2015-12-27 NOTE — Anesthesia Preprocedure Evaluation (Addendum)
Anesthesia Evaluation  Patient identified by MRN, date of birth, ID band Patient awake and Patient unresponsive    Reviewed: Allergy & Precautions, NPO status , Patient's Chart, lab work & pertinent test results  Airway Mallampati: II  TM Distance: >3 FB Neck ROM: Full    Dental no notable dental hx.    Pulmonary neg pulmonary ROS,    Pulmonary exam normal breath sounds clear to auscultation       Cardiovascular hypertension, Pt. on medications Normal cardiovascular exam Rhythm:Regular Rate:Normal     Neuro/Psych negative neurological ROS  negative psych ROS   GI/Hepatic negative GI ROS, Neg liver ROS,   Endo/Other  negative endocrine ROS  Renal/GU negative Renal ROS  negative genitourinary   Musculoskeletal negative musculoskeletal ROS (+)   Abdominal   Peds negative pediatric ROS (+)  Hematology negative hematology ROS (+)   Anesthesia Other Findings   Reproductive/Obstetrics negative OB ROS                             Anesthesia Physical Anesthesia Plan  ASA: II  Anesthesia Plan: General   Post-op Pain Management: GA combined w/ Regional for post-op pain   Induction: Intravenous  Airway Management Planned: LMA  Additional Equipment:   Intra-op Plan:   Post-operative Plan: Extubation in OR  Informed Consent: I have reviewed the patients History and Physical, chart, labs and discussed the procedure including the risks, benefits and alternatives for the proposed anesthesia with the patient or authorized representative who has indicated his/her understanding and acceptance.   Dental advisory given  Plan Discussed with: CRNA  Anesthesia Plan Comments: (Discussed r/b TAP block including bleeding, infection, failure, nerve damage.)       Anesthesia Quick Evaluation

## 2015-12-27 NOTE — Anesthesia Procedure Notes (Signed)
Procedure Name: LMA Insertion Date/Time: 12/27/2015 2:14 PM Performed by: Toula Moos L Pre-anesthesia Checklist: Patient identified, Emergency Drugs available, Suction available, Patient being monitored and Timeout performed Patient Re-evaluated:Patient Re-evaluated prior to inductionOxygen Delivery Method: Circle system utilized Preoxygenation: Pre-oxygenation with 100% oxygen Intubation Type: IV induction Ventilation: Mask ventilation without difficulty LMA: LMA inserted LMA Size: 5.0 Number of attempts: 1 Airway Equipment and Method: Bite block Placement Confirmation: positive ETCO2 Tube secured with: Tape Dental Injury: Teeth and Oropharynx as per pre-operative assessment

## 2015-12-28 DIAGNOSIS — K4090 Unilateral inguinal hernia, without obstruction or gangrene, not specified as recurrent: Secondary | ICD-10-CM | POA: Diagnosis not present

## 2015-12-28 NOTE — Progress Notes (Signed)
Called Joshua Bowers for follow-up. He reports that his right leg numbness and weakness started to wear off about 0200 today. He was able to walk out of the [facility] early this morning. He was seen by Dr. Ninfa Linden before discharge. He reports that he is currently sore from his surgery but doing well overall. We discussed the probable cause of his leg weakness. Questions answered.

## 2015-12-28 NOTE — Discharge Summary (Signed)
Physician Discharge Summary  Patient ID: Joshua Bowers MRN: [id number] DOB/AGE: 1951/05/21 65 y.o.  Admit date: 12/27/2015 Discharge date: 12/28/2015  Admission Diagnoses:  Discharge Diagnoses:  Active Problems:   S/P hernia repair   Discharged Condition: good  Hospital Course: admitted post op at request of anesthesia secondary to right leg weakness after TAP block.  Improved and discharge POD#1  Consults: None  Significant Diagnostic Studies:   Treatments: surgery: right inguinal hernia repair with mesh  Discharge Exam: Blood pressure 133/84, pulse 88, temperature 98.1 F (36.7 C), resp. rate 18, height 6' (1.829 m), weight 81.2 kg (179 lb), SpO2 96 %. General appearance: alert, cooperative and no distress Incision/Wound:clean Less leg weakness  Disposition: 01-Home or Self Care     Medication List    TAKE these medications   amLODipine 5 MG tablet Commonly known as:  NORVASC Take 5 mg by mouth daily.   aspirin 325 MG EC tablet Take 325 mg by mouth daily.   FISH OIL PO Take 1 capsule by mouth daily.   HYDROcodone-acetaminophen 5-325 MG tablet Commonly known as:  NORCO Take 1-2 tablets by mouth every 4 (four) hours as needed for moderate pain.   losartan 50 MG tablet Commonly known as:  COZAAR Take 50 mg by mouth daily.   multivitamin with minerals Tabs tablet Take 1 tablet by mouth daily.   tamsulosin 0.4 MG Caps capsule Commonly known as:  FLOMAX Take 0.4 mg by mouth.   VITAMIN B 12 PO Take 1 tablet by mouth daily.      Follow-up Information    Royelle Hinchman A, MD. Schedule an appointment as soon as possible for a visit in 2 weeks.   Specialty:  General Surgery Contact information: [address] [place] [address] [phone]           Signed: Harl Bowie 12/28/2015, 6:37 AM

## 2015-12-28 NOTE — Progress Notes (Signed)
Patient ID: Joshua Bowers, male   DOB: 1950-09-30, 65 y.o.   MRN: [id number]   Feeling better Less leg weakness  Plan: discharge

## 2015-12-29 ENCOUNTER — Encounter (HOSPITAL_BASED_OUTPATIENT_CLINIC_OR_DEPARTMENT_OTHER): Payer: Self-pay | Admitting: Surgery

## 2016-01-25 ENCOUNTER — Encounter: Payer: Self-pay | Admitting: Gastroenterology

## 2016-03-06 NOTE — H&P (Signed)
Joshua Bowers  Location: [facility] Patient #: [id number] DOB: 16-Nov-1950 Married / Language: English / Race: White Male   History of Present Illness Joshua Canary A. Ninfa Linden MD;  The patient is a 65 year old male who presents with an inguinal hernia. This gentleman is referred by Dr. Monico Blitz for evaluation of a right inguinal hernia. The patient reports he has had for some time. It is slowly getting larger and causing some mild right groin discomfort. He denies any obstructive symptoms. He is otherwise without complaints. He doesn't moderate amount of heavy lifting at work.   Other Problems Davy Pique Bynum, CMA;  Bladder Problems Cancer High blood pressure  Past Surgical History (Sonya Bynum,  Appendectomy Tonsillectomy Vasectomy  Diagnostic Studies History Marjean Donna, CMA;  Colonoscopy 5-10 years ago  Allergies Marjean Donna, CMA;  No Known Drug Allergies07/04/2016  Medication History (Sonya Bynum, CMA;  AmLODIPine Besylate (5MG  Tablet, Oral) Active. Losartan Potassium (50MG  Tablet, Oral) Active. Tamsulosin HCl (0.4MG  Capsule, Oral) Active.  Social History Davy Pique Bynum, CMA;  Alcohol use Occasional alcohol use. Caffeine use Carbonated beverages, Coffee, Tea. No drug use Tobacco use Never smoker.  Family History Marjean Donna, [place]; Arthritis Father, Mother. Heart Disease Brother, Father. Heart disease in male family member before age 29 Hypertension Brother, Father, Mother. Melanoma Father. Prostate Cancer Father.    Review of Systems ([place];  General Not Present- Appetite Loss, Chills, Fatigue, Fever, Night Sweats, Weight Gain and Weight Loss. Skin Not Present- Change in Wart/Mole, Dryness, Hives, Jaundice, New Lesions, Non-Healing Wounds, Rash and Ulcer. HEENT Present- Seasonal Allergies. Not Present- Earache, Hearing Loss, Hoarseness, Nose Bleed, Oral Ulcers, Ringing in the Ears, Sinus Pain, Sore Throat, Visual  Disturbances, Wears glasses/contact lenses and Yellow Eyes. Respiratory Not Present- Bloody sputum, Chronic Cough, Difficulty Breathing, Snoring and Wheezing. Breast Not Present- Breast Mass, Breast Pain, Nipple Discharge and Skin Changes. Cardiovascular Not Present- Chest Pain, Difficulty Breathing Lying Down, Leg Cramps, Palpitations, Rapid Heart Rate, Shortness of Breath and Swelling of Extremities. Gastrointestinal Not Present- Abdominal Pain, Bloating, Bloody Stool, Change in Bowel Habits, Chronic diarrhea, Constipation, Difficulty Swallowing, Excessive gas, Gets full quickly at meals, Hemorrhoids, Indigestion, Nausea, Rectal Pain and Vomiting. Male Genitourinary Present- Frequency. Not Present- Blood in Urine, Change in Urinary Stream, Impotence, Nocturia, Painful Urination, Urgency and Urine Leakage. Musculoskeletal Present- Joint Pain. Not Present- Back Pain, Joint Stiffness, Muscle Pain, Muscle Weakness and Swelling of Extremities. Neurological Not Present- Decreased Memory, Fainting, Headaches, Numbness, Seizures, Tingling, Tremor, Trouble walking and Weakness. Psychiatric Not Present- Anxiety, Bipolar, Change in Sleep Pattern, Depression, Fearful and Frequent crying. Endocrine Not Present- Cold Intolerance, Excessive Hunger, Hair Changes, Heat Intolerance, Hot flashes and New Diabetes. Hematology Not Present- Blood Thinners, Easy Bruising, Excessive bleeding, Gland problems, HIV and Persistent Infections.  Vitals (Sonya Bynum CMA;  Weight: 180 lb Temp.: 97.55F(Temporal)  Pulse: 81 (Regular)  BP: 126/76 (Sitting, Left Arm, Standard)   Physical Exam (Quincee Gittens A. Ninfa Linden MD;  General Mental Status-Alert. General Appearance-Consistent with stated age. Hydration-Well hydrated. Voice-Normal.  Head and Neck Head-normocephalic, atraumatic with no lesions or palpable masses. Trachea-midline.  Eye Eyeball - Bilateral-Extraocular movements  intact. Sclera/Conjunctiva - Bilateral-No scleral icterus.  Chest and Lung Exam Chest and lung exam reveals -quiet, even and easy respiratory effort with no use of accessory muscles and on auscultation, normal breath sounds, no adventitious sounds and normal vocal resonance. Inspection Chest Wall - Normal. Back - normal.  Cardiovascular Cardiovascular examination reveals -normal heart sounds, regular rate and rhythm with  no murmurs and normal pedal pulses bilaterally.  Abdomen Inspection Skin - Scar - no surgical scars. Hernias - Inguinal hernia - Right - Reducible. Palpation/Percussion Palpation and Percussion of the abdomen reveal - Soft, Non Tender, No Rebound tenderness, No Rigidity (guarding) and No hepatosplenomegaly. Auscultation Auscultation of the abdomen reveals - Bowel sounds normal.  Neurologic - Did not examine.  Musculoskeletal - Did not examine.    Assessment & Plan (Tyjae Issa A. Ninfa Linden MD;  RIGHT INGUINAL HERNIA (K40.90) Impression: I discussed the diagnosis with him. I discussed inguinal hernia surgery with mesh. I discussed both the open and laparoscopic techniques. After discussion, he wishes to proceed with a right inguinal hernia repair with mesh. I discussed the risks which includes but is not limited to bleeding, infection, injury to surrounding structures, nerve entrapment, chronic pain, recurrent hernia, cardiopulmonary issues, postoperative recovery, etc. He understands and wishes to proceed with surgery

## 2016-04-10 ENCOUNTER — Ambulatory Visit (AMBULATORY_SURGERY_CENTER): Payer: Self-pay

## 2016-04-10 VITALS — Ht 72.0 in | Wt 180.0 lb

## 2016-04-10 DIAGNOSIS — Z1211 Encounter for screening for malignant neoplasm of colon: Secondary | ICD-10-CM

## 2016-04-10 MED ORDER — SUPREP BOWEL PREP KIT 17.5-3.13-1.6 GM/177ML PO SOLN: 1.0000 | Freq: Once | ORAL | 0 refills | Status: DC

## 2016-04-10 MED ORDER — SUPREP BOWEL PREP KIT 17.5-3.13-1.6 GM/177ML PO SOLN: 1.0000 | Freq: Once | ORAL | 0 refills | Status: AC

## 2016-04-10 NOTE — Progress Notes (Signed)
No allergies to eggs or soy No past problems with anesthesia No diet meds No home oxygen  Declined emmi 

## 2016-04-28 ENCOUNTER — Encounter: Payer: Self-pay | Admitting: Gastroenterology

## 2016-04-28 ENCOUNTER — Ambulatory Visit (AMBULATORY_SURGERY_CENTER): Payer: 59 | Admitting: Gastroenterology

## 2016-04-28 VITALS — BP 134/83 | HR 70 | Temp 97.5°F | Resp 16 | Ht 72.0 in | Wt 180.0 lb

## 2016-04-28 DIAGNOSIS — D120 Benign neoplasm of cecum: Secondary | ICD-10-CM | POA: Diagnosis not present

## 2016-04-28 DIAGNOSIS — K635 Polyp of colon: Secondary | ICD-10-CM

## 2016-04-28 DIAGNOSIS — D125 Benign neoplasm of sigmoid colon: Secondary | ICD-10-CM

## 2016-04-28 DIAGNOSIS — Z1212 Encounter for screening for malignant neoplasm of rectum: Secondary | ICD-10-CM | POA: Diagnosis not present

## 2016-04-28 DIAGNOSIS — Z1211 Encounter for screening for malignant neoplasm of colon: Secondary | ICD-10-CM

## 2016-04-28 MED ORDER — SODIUM CHLORIDE 0.9 % IV SOLN: 500.0000 mL | INTRAVENOUS | Status: DC

## 2016-04-28 NOTE — Patient Instructions (Signed)
YOU HAD AN ENDOSCOPIC PROCEDURE TODAY AT [facility]:   Refer to the procedure report that was given to you for any specific questions about what was found during the examination.  If the procedure report does not answer your questions, please call your gastroenterologist to clarify.  If you requested that your care partner not be given the details of your procedure findings, then the procedure report has been included in a sealed envelope for you to review at your convenience later.  YOU SHOULD EXPECT: Some feelings of bloating in the abdomen. Passage of more gas than usual.  Walking can help get rid of the air that was put into your GI tract during the procedure and reduce the bloating. If you had a lower endoscopy (such as a colonoscopy or flexible sigmoidoscopy) you may notice spotting of blood in your stool or on the toilet paper. If you underwent a bowel prep for your procedure, you may not have a normal bowel movement for a few days.  Please Note:  You might notice some irritation and congestion in your nose or some drainage.  This is from the oxygen used during your procedure.  There is no need for concern and it should clear up in a day or so.  SYMPTOMS TO REPORT IMMEDIATELY:   Following lower endoscopy (colonoscopy or flexible sigmoidoscopy):  Excessive amounts of blood in the stool  Significant tenderness or worsening of abdominal pains  Swelling of the abdomen that is new, acute  Fever of 100F or higher   For urgent or emergent issues, a gastroenterologist can be reached at any hour by calling [phone].   DIET:  We do recommend a small meal at first, but then you may proceed to your regular diet.  Drink plenty of fluids but you should avoid alcoholic beverages for 24 hours.  ACTIVITY:  You should plan to take it easy for the rest of today and you should NOT DRIVE or use heavy machinery until tomorrow (because of the sedation medicines used during the test).     FOLLOW UP: Our staff will call the number listed on your records the next business day following your procedure to check on you and address any questions or concerns that you may have regarding the information given to you following your procedure. If we do not reach you, we will leave a message.  However, if you are feeling well and you are not experiencing any problems, there is no need to return our call.  We will assume that you have returned to your regular daily activities without incident.  If any biopsies were taken you will be contacted by phone or by letter within the next 1-3 weeks.  Please call us at [phone] if you have not heard about the biopsies in 3 weeks.    SIGNATURES/CONFIDENTIALITY: You and/or your care partner have signed paperwork which will be entered into your electronic medical record.  These signatures attest to the fact that that the information above on your After Visit Summary has been reviewed and is understood.  Full responsibility of the confidentiality of this discharge information lies with you and/or your care-partner.  Polyps-handout given  Repeat colonoscopy will be determined by pathology.

## 2016-04-28 NOTE — Progress Notes (Signed)
Called to room to assist during endoscopic procedure.  Patient ID and intended procedure confirmed with present staff. Received instructions for my participation in the procedure from the performing physician.  

## 2016-04-28 NOTE — Op Note (Signed)
[facility] Patient Name: Joshua Bowers Procedure Date: 04/28/2016 9:31 AM MRN: [id number] Endoscopist: Milus Banister , MD Age: 65 Referring MD:  Date of Birth: Jan 09, 1951 Gender: Male Account #: [id number] Procedure:                Colonoscopy Indications:              Screening for colorectal malignant neoplasm Medicines:                Monitored Anesthesia Care Procedure:                Pre-Anesthesia Assessment:                           - Prior to the procedure, a History and Physical                            was performed, and patient medications and                            allergies were reviewed. The patient's tolerance of                            previous anesthesia was also reviewed. The risks                            and benefits of the procedure and the sedation                            options and risks were discussed with the patient.                            All questions were answered, and informed consent                            was obtained. Prior Anticoagulants: The patient has                            taken no previous anticoagulant or antiplatelet                            agents. ASA Grade Assessment: II - A patient with                            mild systemic disease. After reviewing the risks                            and benefits, the patient was deemed in                            satisfactory condition to undergo the procedure.                           After obtaining informed consent, the colonoscope  was passed under direct vision. Throughout the                            procedure, the patient's blood pressure, pulse, and                            oxygen saturations were monitored continuously. The                            Model PCF-H190L [id number]) scope was introduced                            through the anus and advanced to the the cecum,                            identified by  appendiceal orifice and ileocecal                            valve. The colonoscopy was performed without                            difficulty. The patient tolerated the procedure                            well. The quality of the bowel preparation was                            excellent. The ileocecal valve, appendiceal                            orifice, and rectum were photographed. Scope In: 9:32:30 AM Scope Out: 9:41:43 AM Scope Withdrawal Time: 0 hours 8 minutes 6 seconds  Total Procedure Duration: 0 hours 9 minutes 13 seconds  Findings:                 Two sessile polyps were found in the sigmoid colon                            and cecum. The polyps were 2 to 3 mm in size. These                            polyps were removed with a cold snare. Resection                            and retrieval were complete.                           The exam was otherwise without abnormality on                            direct and retroflexion views. Complications:            No immediate complications. Estimated blood loss:  None. Estimated Blood Loss:     Estimated blood loss: none. Impression:               - Two 2 to 3 mm polyps in the sigmoid colon and in                            the cecum, removed with a cold snare. Resected and                            retrieved.                           - The examination was otherwise normal on direct                            and retroflexion views. Recommendation:           - Patient has a contact number available for                            emergencies. The signs and symptoms of potential                            delayed complications were discussed with the                            patient. Return to normal activities tomorrow.                            Written discharge instructions were provided to the                            patient.                           - Resume previous diet.                            - Resume previous diet.                           - Continue present medications.                           You will receive a letter within 2-3 weeks with the                            pathology results and my final recommendations.                           If the polyp(s) is proven to be 'pre-cancerous' on                            pathology, you will need repeat colonoscopy in 5  years. If the polyp(s) is NOT 'precancerous' on                            pathology then you should repeat colon cancer                            screening in 10 years with colonoscopy without need                            for colon cancer screening by any method prior to                            then (including stool testing). Milus Banister, MD 04/28/2016 9:45:53 AM This report has been signed electronically.

## 2016-04-28 NOTE — Progress Notes (Signed)
Report to PACU, RN, vss, BBS= Clear.  

## 2016-05-01 ENCOUNTER — Telehealth: Payer: Self-pay | Admitting: *Deleted

## 2016-05-01 NOTE — Telephone Encounter (Signed)
  Follow up Call-  Call back number 04/28/2016  Post procedure Call Back phone  # [phone]  Permission to leave phone message Yes  Some recent data might be hidden     Patient questions:  Do you have a fever, pain , or abdominal swelling? No. Pain Score  0 *  Have you tolerated food without any problems? Yes.    Have you been able to return to your normal activities? Yes.    Do you have any questions about your discharge instructions: Diet   No. Medications  No. Follow up visit  No.  Do you have questions or concerns about your Care? No.  Actions: * If pain score is 4 or above: No action needed, pain <4.

## 2016-05-02 ENCOUNTER — Encounter (INDEPENDENT_AMBULATORY_CARE_PROVIDER_SITE_OTHER): Payer: 59 | Admitting: Ophthalmology

## 2016-05-02 DIAGNOSIS — H35033 Hypertensive retinopathy, bilateral: Secondary | ICD-10-CM

## 2016-05-02 DIAGNOSIS — I10 Essential (primary) hypertension: Secondary | ICD-10-CM | POA: Diagnosis not present

## 2016-05-02 DIAGNOSIS — H43813 Vitreous degeneration, bilateral: Secondary | ICD-10-CM

## 2016-05-02 DIAGNOSIS — H33022 Retinal detachment with multiple breaks, left eye: Secondary | ICD-10-CM | POA: Diagnosis not present

## 2016-05-02 DIAGNOSIS — H2513 Age-related nuclear cataract, bilateral: Secondary | ICD-10-CM

## 2016-05-03 ENCOUNTER — Encounter: Payer: Self-pay | Admitting: Gastroenterology

## 2016-05-05 ENCOUNTER — Ambulatory Visit (INDEPENDENT_AMBULATORY_CARE_PROVIDER_SITE_OTHER): Payer: 59 | Admitting: Urology

## 2016-05-05 DIAGNOSIS — R351 Nocturia: Secondary | ICD-10-CM | POA: Diagnosis not present

## 2016-05-05 DIAGNOSIS — R972 Elevated prostate specific antigen [PSA]: Secondary | ICD-10-CM | POA: Diagnosis not present

## 2016-05-05 DIAGNOSIS — N5201 Erectile dysfunction due to arterial insufficiency: Secondary | ICD-10-CM

## 2016-05-05 DIAGNOSIS — Z8551 Personal history of malignant neoplasm of bladder: Secondary | ICD-10-CM | POA: Diagnosis not present

## 2016-05-05 DIAGNOSIS — N401 Enlarged prostate with lower urinary tract symptoms: Secondary | ICD-10-CM | POA: Diagnosis not present

## 2016-05-12 ENCOUNTER — Ambulatory Visit (INDEPENDENT_AMBULATORY_CARE_PROVIDER_SITE_OTHER): Payer: 59 | Admitting: Ophthalmology

## 2016-05-12 DIAGNOSIS — H33022 Retinal detachment with multiple breaks, left eye: Secondary | ICD-10-CM

## 2016-09-08 ENCOUNTER — Ambulatory Visit (INDEPENDENT_AMBULATORY_CARE_PROVIDER_SITE_OTHER): Payer: 59 | Admitting: Urology

## 2016-09-08 DIAGNOSIS — Z8551 Personal history of malignant neoplasm of bladder: Secondary | ICD-10-CM | POA: Diagnosis not present

## 2016-09-08 DIAGNOSIS — N5201 Erectile dysfunction due to arterial insufficiency: Secondary | ICD-10-CM

## 2016-09-08 DIAGNOSIS — R972 Elevated prostate specific antigen [PSA]: Secondary | ICD-10-CM | POA: Diagnosis not present

## 2016-09-08 DIAGNOSIS — R3912 Poor urinary stream: Secondary | ICD-10-CM

## 2016-09-08 DIAGNOSIS — N401 Enlarged prostate with lower urinary tract symptoms: Secondary | ICD-10-CM

## 2016-09-29 ENCOUNTER — Ambulatory Visit (INDEPENDENT_AMBULATORY_CARE_PROVIDER_SITE_OTHER): Payer: 59 | Admitting: Ophthalmology

## 2016-09-29 DIAGNOSIS — H35033 Hypertensive retinopathy, bilateral: Secondary | ICD-10-CM

## 2016-09-29 DIAGNOSIS — I10 Essential (primary) hypertension: Secondary | ICD-10-CM

## 2016-09-29 DIAGNOSIS — H43813 Vitreous degeneration, bilateral: Secondary | ICD-10-CM

## 2016-09-29 DIAGNOSIS — H33302 Unspecified retinal break, left eye: Secondary | ICD-10-CM

## 2016-09-29 DIAGNOSIS — H2513 Age-related nuclear cataract, bilateral: Secondary | ICD-10-CM | POA: Diagnosis not present

## 2017-09-19 ENCOUNTER — Other Ambulatory Visit: Payer: Self-pay | Admitting: Urology

## 2017-11-13 ENCOUNTER — Other Ambulatory Visit: Payer: Self-pay

## 2017-11-13 ENCOUNTER — Encounter (HOSPITAL_BASED_OUTPATIENT_CLINIC_OR_DEPARTMENT_OTHER): Payer: Self-pay | Admitting: *Deleted

## 2017-11-13 NOTE — Progress Notes (Signed)
Spoke w/ pt via phone for pre-op interview.  Npo after mn.  Arrive at 0530.  Needs istat and ekg.  Last CT , dated 01-12-2016, in care everywhere in epic.  Will take norvasc am dos w/ sips of water.  Reviewed [facility] guidelines , will bring medications.

## 2017-11-19 ENCOUNTER — Encounter (HOSPITAL_BASED_OUTPATIENT_CLINIC_OR_DEPARTMENT_OTHER): Payer: Self-pay | Admitting: Anesthesiology

## 2017-11-19 NOTE — H&P (Signed)
CC/HPI: Pt presents today for pre-operative history and physical exam in anticipation of TURP 11/20/17 by Dr. Jeffie Pollock. He is without complaint. He had to have a root canal earlier today but states he has done well with it.   Pt denies F/C, HA, CP, SOB, N/V, diarrhea/constipation, back pain, flank pain, hematuria, and dysuria.    HX:     CC: Elevated PSA-Established Patient  HPI: Joshua Bowers is a 67 year-old male established patient who is here for follow up for further evaluation of his elevated PSA.  [name] returns today in f/u for his history of a PSA elevation. His PSA was 6.4 prior to this visit which is in his usual range. He had a negative biopsy in 2013 and a negative MRI in 2015. He also has BPH with BOO and his symptoms have gotten worse over the past year. he remains on tamsulosin after alfuzosin failed to improve his symptoms. He also takes sildenafil daily which helps both voiding and erections. He has some urgency and UUI that is worse with tea. He has some hesitancy and intermittency if he delays voiding. He has an adequate stream with nocturia x 1. His IPSS is 18. He has no other associated signs or symptoms.   He has a history of bladder cancer treated in 9/11 with a 2cm LG NMIBC. He had a negative cystoscopy and cytology in September 2015. He has had no hematuria.    Last note reviewed and updated.    He has ED and has used Viagra, sildenafil and Cialis. He gets some benefit from that.     His last PSA was performed 09/07/2017. The last PSA value was 6.4. The patient states he does not take 5 alpha reductase inhibitor medication.   He has had a prostate biopsy done.     ALLERGIES: No Allergies    MEDICATIONS: Tamsulosin Hcl 0.4 mg capsule 1 capsule PO Daily  Amlodipine Besylate 5 mg tablet Oral  Aspirin 81 MG TABS Oral  Krill Oil  Losartan Potassium 50 mg tablet Oral  Multivitamins tablet Oral  Sildenafil 20 mg tablet 1 tablet PO Daily  Vitamin B Complex  Zinc      GU PSH: Cystoscopy TURBT 2-5 cm - 2013      PSH Notes: Cystoscopy With Fulguration Medium Lesion (2-5cm), Arm Incision   NON-GU PSH: Eye Surgery (Unspecified) Hernia Repair, Right, Inguinal laparoscopic    GU PMH: Elevated PSA, PSA is stable. - 09/14/2017, (Worsening), He has a slight increase in the PSA but a large benign prostate. I am going to repeat the PSA in 6 months and if it continues to rise, I will consider repeat MRI and possible biopsy. , - 03/21/2017 (Improving), His PSA is back down some and only 0.25 above his prior high. I am just going to have him return in 6 months with a PSA. , - 09/08/2016, - 05/05/2016, Elevated prostate specific antigen (PSA), - 2017 Urge incontinence (Worsening) - 03/21/2017 BPH w/LUTS, Benign prostatic hyperplasia with urinary obstruction - 2017 ED due to arterial insufficiency, Erectile dysfunction due to arterial insufficiency - 2017 Nocturia, Nocturia - 2017 Unil Inguinal Hernia W/O obst or gang,non-recurrent, Right inguinal hernia - 2017 History of bladder cancer, History of malignant neoplasm of bladder - 2016, Bladder Cancer, - 2014 Weak Urinary Stream, Weak urinary stream - 2016 Abnormal urine findings, Cytological and Histological exam, Abnormal findings on cytological and histological examination of urine - 2015      PMH Notes:  2013-01-29 12:14:30 -  Note: Furuncle  2011-09-19 16:19:56 - Note: Cystoscopy Bladder Tumor   NON-GU PMH: Encounter for general adult medical examination without abnormal findings, Encounter for preventive health examination - 2016 Personal history of other diseases of the circulatory system, History of hypertension - 2014    FAMILY HISTORY: Family Health Status Number - Runs In Family Heart Disease - Runs In Family Prostate Cancer - Runs In Family   SOCIAL HISTORY: Marital Status: Divorced Preferred Language: English; Ethnicity: Not Hispanic Or Latino; Race: White Current Smoking Status: Patient has  never smoked.   Tobacco Use Assessment Completed: Used Tobacco in last 30 days? Has never drank.  Does not use drugs. Drinks 1 caffeinated drink per day.     Notes: Alcohol Use, Occupation:, Marital History - Divorced, Never A Smoker, Caffeine Use   REVIEW OF SYSTEMS:    GU Review Male:   Patient reports frequent urination, hard to postpone urination, leakage of urine, and trouble starting your stream. Patient denies burning/ pain with urination, get up at night to urinate, stream starts and stops, have to strain to urinate , erection problems, and penile pain.  Gastrointestinal (Upper):   Patient denies nausea, vomiting, and indigestion/ heartburn.  Gastrointestinal (Lower):   Patient denies diarrhea and constipation.  Constitutional:   Patient denies fever, night sweats, weight loss, and fatigue.  Skin:   Patient denies skin rash/ lesion and itching.  Eyes:   Patient denies blurred vision and double vision.  Ears/ Nose/ Throat:   Patient denies sore throat and sinus problems.  Hematologic/Lymphatic:   Patient denies swollen glands and easy bruising.  Cardiovascular:   Patient denies leg swelling and chest pains.  Respiratory:   Patient denies cough and shortness of breath.  Endocrine:   Patient denies excessive thirst.  Musculoskeletal:   Patient reports joint pain. Patient denies back pain.  Neurological:   Patient denies headaches and dizziness.  Psychologic:   Patient denies depression and anxiety.   VITAL SIGNS:      11/13/2017 03:05 PM  Weight 187 lb / 84.82 kg  Height 71 in / 180.34 cm  BP 168/97 mmHg  Pulse 69 /min  Temperature 97.8 F / 36.5 C  BMI 26.1 kg/m   MULTI-SYSTEM PHYSICAL EXAMINATION:    Constitutional: Well-nourished. No physical deformities. Normally developed. Good grooming.  Neck: Neck symmetrical, not swollen. Normal tracheal position.  Respiratory: Normal breath sounds. No labored breathing, no use of accessory muscles.   Cardiovascular: Regular rate  and rhythm. No murmur, no gallop. Normal temperature, normal extremity pulses, no swelling, minimal varicosites bilateral LE.   Lymphatic: No enlargement of neck, axillae, groin.  Skin: No paleness, no jaundice, no cyanosis. No lesion, no ulcer, no rash.  Neurologic / Psychiatric: Oriented to time, oriented to place, oriented to person. No depression, no anxiety, no agitation.  Gastrointestinal: No mass, no tenderness, no rigidity, non obese abdomen.  Eyes: Normal conjunctivae. Normal eyelids.  Ears, Nose, Mouth, and Throat: Left ear no scars, no lesions, no masses. Right ear no scars, no lesions, no masses. Nose no scars, no lesions, no masses. Normal hearing. Normal lips.  Musculoskeletal: Normal gait and station of head and neck.     PAST DATA REVIEWED:  Source Of History:  Patient  Records Review:   Previous Patient Records  Urine Test Review:   Urinalysis   09/07/17  PSA  Total PSA 6.4 ng/dl    11/13/17  Urinalysis  Urine Appearance Clear   Urine Color Yellow   Urine Glucose  Neg   Urine Bilirubin Neg   Urine Ketones Neg   Urine Specific Gravity 1.020   Urine Blood Neg   Urine pH 6.5   Urine Protein Neg   Urine Urobilinogen 0.2   Urine Nitrites Neg   Urine Leukocyte Esterase Neg    PROCEDURES:          Urinalysis Dipstick Dipstick Cont'd  Color: Yellow Bilirubin: Neg  Appearance: Clear Ketones: Neg  Specific Gravity: 1.020 Blood: Neg  pH: 6.5 Protein: Neg  Glucose: Neg Urobilinogen: 0.2    Nitrites: Neg    Leukocyte Esterase: Neg    ASSESSMENT:      ICD-10 Details  1 GU:   BPH w/LUTS - N40.1   2   Elevated PSA - R97.20    PLAN:           Schedule Return Visit/Planned Activity: Keep Scheduled Appointment - Schedule Surgery          Document Letter(s):  Created for Patient: Clinical Summary         Notes:   There are no changes in the patients history or physical exam since last evaluation by Dr. Jeffie Pollock. Pt is scheduled to undergo TURP 11/20/17.   All  pt's questions were answered to the best of my ability.          Next Appointment:      Next Appointment: 11/20/2017 07:30 AM    Appointment Type: Surgery     Location: [facility] [phone]    Provider: [name], M.D.    Reason for Visit: OBS NE TURP

## 2017-11-19 NOTE — Anesthesia Preprocedure Evaluation (Addendum)
Anesthesia Evaluation  Patient identified by MRN, date of birth, ID band Patient awake    Reviewed: Allergy & Precautions, NPO status , Patient's Chart, lab work & pertinent test results  Airway Mallampati: II  TM Distance: >3 FB Neck ROM: Full    Dental  (+) Teeth Intact   Pulmonary neg pulmonary ROS,    Pulmonary exam normal breath sounds clear to auscultation       Cardiovascular hypertension, Pt. on medications Normal cardiovascular exam+ dysrhythmias  Rhythm:Regular Rate:Normal  RBBB   Neuro/Psych negative neurological ROS  negative psych ROS   GI/Hepatic Neg liver ROS, GERD  Medicated and Controlled,  Endo/Other  negative endocrine ROS  Renal/GU negative Renal ROS   BPH Bladder outlet obstruction    Musculoskeletal  (+) Arthritis , Osteoarthritis,    Abdominal   Peds  Hematology negative hematology ROS (+)   Anesthesia Other Findings   Reproductive/Obstetrics                            Anesthesia Physical Anesthesia Plan  ASA: II  Anesthesia Plan: General   Post-op Pain Management:    Induction: Intravenous  PONV Risk Score and Plan: 4 or greater and Midazolam, Dexamethasone, Ondansetron and Treatment may vary due to age or medical condition  Airway Management Planned: LMA  Additional Equipment:   Intra-op Plan:   Post-operative Plan: Extubation in OR  Informed Consent: I have reviewed the patients History and Physical, chart, labs and discussed the procedure including the risks, benefits and alternatives for the proposed anesthesia with the patient or authorized representative who has indicated his/her understanding and acceptance.   Dental advisory given  Plan Discussed with: CRNA  Anesthesia Plan Comments:       Anesthesia Quick Evaluation

## 2017-11-20 ENCOUNTER — Observation Stay (HOSPITAL_BASED_OUTPATIENT_CLINIC_OR_DEPARTMENT_OTHER)
Admission: RE | Admit: 2017-11-20 | Discharge: 2017-11-21 | Disposition: A | Discharge status: Home / Self Care | Payer: 59 | Source: Ambulatory Visit | Attending: Urology | Admitting: Urology

## 2017-11-20 ENCOUNTER — Ambulatory Visit (HOSPITAL_BASED_OUTPATIENT_CLINIC_OR_DEPARTMENT_OTHER): Payer: 59 | Admitting: Anesthesiology

## 2017-11-20 ENCOUNTER — Encounter (HOSPITAL_BASED_OUTPATIENT_CLINIC_OR_DEPARTMENT_OTHER): Payer: Self-pay

## 2017-11-20 ENCOUNTER — Encounter (HOSPITAL_BASED_OUTPATIENT_CLINIC_OR_DEPARTMENT_OTHER)
Admission: RE | Disposition: A | Discharge status: Home / Self Care | Payer: Self-pay | Source: Ambulatory Visit | Attending: Urology

## 2017-11-20 DIAGNOSIS — N320 Bladder-neck obstruction: Secondary | ICD-10-CM | POA: Diagnosis present

## 2017-11-20 DIAGNOSIS — Z8042 Family history of malignant neoplasm of prostate: Secondary | ICD-10-CM | POA: Insufficient documentation

## 2017-11-20 DIAGNOSIS — Z9889 Other specified postprocedural states: Secondary | ICD-10-CM | POA: Diagnosis not present

## 2017-11-20 DIAGNOSIS — N529 Male erectile dysfunction, unspecified: Secondary | ICD-10-CM | POA: Diagnosis not present

## 2017-11-20 DIAGNOSIS — N138 Other obstructive and reflux uropathy: Secondary | ICD-10-CM | POA: Diagnosis present

## 2017-11-20 DIAGNOSIS — Z8551 Personal history of malignant neoplasm of bladder: Secondary | ICD-10-CM | POA: Diagnosis not present

## 2017-11-20 DIAGNOSIS — I10 Essential (primary) hypertension: Secondary | ICD-10-CM | POA: Insufficient documentation

## 2017-11-20 DIAGNOSIS — N401 Enlarged prostate with lower urinary tract symptoms: Secondary | ICD-10-CM | POA: Diagnosis not present

## 2017-11-20 DIAGNOSIS — Z7982 Long term (current) use of aspirin: Secondary | ICD-10-CM | POA: Insufficient documentation

## 2017-11-20 DIAGNOSIS — Z8249 Family history of ischemic heart disease and other diseases of the circulatory system: Secondary | ICD-10-CM | POA: Diagnosis not present

## 2017-11-20 DIAGNOSIS — R972 Elevated prostate specific antigen [PSA]: Secondary | ICD-10-CM | POA: Insufficient documentation

## 2017-11-20 DIAGNOSIS — Z79899 Other long term (current) drug therapy: Secondary | ICD-10-CM | POA: Insufficient documentation

## 2017-11-20 HISTORY — PX: TRANSURETHRAL RESECTION OF PROSTATE: SHX73

## 2017-11-20 HISTORY — DX: Benign prostatic hyperplasia without lower urinary tract symptoms: N40.0

## 2017-11-20 HISTORY — DX: Unspecified osteoarthritis, unspecified site: M19.90

## 2017-11-20 HISTORY — DX: Contact with and (suspected) exposure to asbestos: Z77.090

## 2017-11-20 HISTORY — DX: Personal history of malignant neoplasm of bladder: Z85.51

## 2017-11-20 HISTORY — DX: Gastro-esophageal reflux disease without esophagitis: K21.9

## 2017-11-20 HISTORY — DX: Unspecified symptoms and signs involving the genitourinary system: R39.9

## 2017-11-20 HISTORY — DX: Elevated prostate specific antigen (PSA): R97.20

## 2017-11-20 HISTORY — DX: Unspecified right bundle-branch block: I45.10

## 2017-11-20 HISTORY — DX: Bladder-neck obstruction: N32.0

## 2017-11-20 LAB — POCT I-STAT, CHEM 8
BUN: 19 mg/dL (ref 8–23)
Calcium, Ion: 1.22 mmol/L (ref 1.15–1.40)
Chloride: 103 mmol/L (ref 98–111)
Creatinine, Ser: 1.1 mg/dL (ref 0.61–1.24)
Glucose, Bld: 97 mg/dL (ref 70–99)
HCT: 47 % (ref 39.0–52.0)
Hemoglobin: 16 g/dL (ref 13.0–17.0)
Potassium: 3.9 mmol/L (ref 3.5–5.1)
Sodium: 141 mmol/L (ref 135–145)
TCO2: 24 mmol/L (ref 22–32)

## 2017-11-20 SURGERY — TURP (TRANSURETHRAL RESECTION OF PROSTATE)
Anesthesia: General | Site: Prostate

## 2017-11-20 MED ORDER — OXYCODONE HCL 5 MG PO TABS
ORAL_TABLET | ORAL | Status: AC
  Filled 2017-11-20: qty 1

## 2017-11-20 MED ORDER — HYDROMORPHONE HCL 1 MG/ML IJ SOLN
0.2500 mg | INTRAMUSCULAR | Status: DC | PRN
  Administered 2017-11-20: 0.25 mg via INTRAVENOUS
  Administered 2017-11-20: 0.5 mg via INTRAVENOUS
  Administered 2017-11-20: 0.25 mg via INTRAVENOUS
  Administered 2017-11-20 (×2): 0.5 mg via INTRAVENOUS
  Filled 2017-11-20: qty 0.5

## 2017-11-20 MED ORDER — DIAZEPAM 5 MG PO TABS
5.0000 mg | ORAL_TABLET | Freq: Once | ORAL | Status: AC
  Administered 2017-11-20: 5 mg via ORAL
  Filled 2017-11-20: qty 1

## 2017-11-20 MED ORDER — DOCUSATE SODIUM 100 MG PO CAPS
ORAL_CAPSULE | ORAL | Status: AC
Start: 2017-11-20 — End: ?
  Filled 2017-11-20: qty 1

## 2017-11-20 MED ORDER — LIDOCAINE HCL (CARDIAC) PF 100 MG/5ML IV SOSY
PREFILLED_SYRINGE | INTRAVENOUS | Status: DC | PRN
  Administered 2017-11-20: 100 mg via INTRAVENOUS

## 2017-11-20 MED ORDER — OXYCODONE HCL 5 MG PO TABS
5.0000 mg | ORAL_TABLET | ORAL | Status: DC | PRN
  Administered 2017-11-20 – 2017-11-21 (×5): 5 mg via ORAL
  Filled 2017-11-20: qty 1

## 2017-11-20 MED ORDER — FENTANYL CITRATE (PF) 100 MCG/2ML IJ SOLN
INTRAMUSCULAR | Status: AC
  Filled 2017-11-20: qty 2

## 2017-11-20 MED ORDER — POTASSIUM CHLORIDE IN NACL 20-0.45 MEQ/L-% IV SOLN
INTRAVENOUS | Status: DC
  Administered 2017-11-20 – 2017-11-21 (×3): via INTRAVENOUS
  Filled 2017-11-20 (×6): qty 1000

## 2017-11-20 MED ORDER — HYDROMORPHONE HCL 1 MG/ML IJ SOLN
INTRAMUSCULAR | Status: AC
  Filled 2017-11-20: qty 1

## 2017-11-20 MED ORDER — ONDANSETRON HCL 4 MG/2ML IJ SOLN
4.0000 mg | INTRAMUSCULAR | Status: DC | PRN
  Filled 2017-11-20: qty 2

## 2017-11-20 MED ORDER — PROMETHAZINE HCL 25 MG/ML IJ SOLN
6.2500 mg | INTRAMUSCULAR | Status: DC | PRN
  Filled 2017-11-20: qty 1

## 2017-11-20 MED ORDER — DEXAMETHASONE SODIUM PHOSPHATE 10 MG/ML IJ SOLN
INTRAMUSCULAR | Status: AC
  Filled 2017-11-20: qty 1

## 2017-11-20 MED ORDER — CEFAZOLIN SODIUM-DEXTROSE 2-4 GM/100ML-% IV SOLN
INTRAVENOUS | Status: AC
  Filled 2017-11-20: qty 100

## 2017-11-20 MED ORDER — BACITRACIN-NEOMYCIN-POLYMYXIN 400-5-5000 EX OINT
1.0000 "application " | TOPICAL_OINTMENT | Freq: Three times a day (TID) | CUTANEOUS | Status: DC | PRN
  Filled 2017-11-20: qty 1

## 2017-11-20 MED ORDER — ONDANSETRON HCL 4 MG/2ML IJ SOLN
INTRAMUSCULAR | Status: AC
  Filled 2017-11-20: qty 2

## 2017-11-20 MED ORDER — LOSARTAN POTASSIUM 50 MG PO TABS
50.0000 mg | ORAL_TABLET | Freq: Every morning | ORAL | Status: DC
Start: 2017-11-20 — End: 2017-11-21
  Administered 2017-11-20: 50 mg via ORAL
  Filled 2017-11-20 (×2): qty 1

## 2017-11-20 MED ORDER — MIDAZOLAM HCL 5 MG/5ML IJ SOLN
INTRAMUSCULAR | Status: DC | PRN
  Administered 2017-11-20: 2 mg via INTRAVENOUS

## 2017-11-20 MED ORDER — PROPOFOL 10 MG/ML IV BOLUS
INTRAVENOUS | Status: AC
  Filled 2017-11-20: qty 40

## 2017-11-20 MED ORDER — LABETALOL HCL 5 MG/ML IV SOLN
INTRAVENOUS | Status: AC
  Filled 2017-11-20: qty 4

## 2017-11-20 MED ORDER — BELLADONNA ALKALOIDS-OPIUM 16.2-30 MG RE SUPP
RECTAL | Status: AC
  Filled 2017-11-20: qty 1

## 2017-11-20 MED ORDER — BELLADONNA ALKALOIDS-OPIUM 16.2-60 MG RE SUPP
1.0000 | Freq: Four times a day (QID) | RECTAL | Status: DC | PRN
  Administered 2017-11-20 – 2017-11-21 (×3): 1 via RECTAL
  Filled 2017-11-20: qty 1

## 2017-11-20 MED ORDER — DEXAMETHASONE SODIUM PHOSPHATE 4 MG/ML IJ SOLN
INTRAMUSCULAR | Status: DC | PRN
  Administered 2017-11-20: 10 mg via INTRAVENOUS

## 2017-11-20 MED ORDER — DIPHENHYDRAMINE HCL 12.5 MG/5ML PO ELIX
12.5000 mg | ORAL_SOLUTION | Freq: Four times a day (QID) | ORAL | Status: DC | PRN
  Filled 2017-11-20: qty 5

## 2017-11-20 MED ORDER — SENNOSIDES-DOCUSATE SODIUM 8.6-50 MG PO TABS
1.0000 | ORAL_TABLET | Freq: Every evening | ORAL | Status: DC | PRN
  Filled 2017-11-20: qty 1

## 2017-11-20 MED ORDER — DIPHENHYDRAMINE HCL 50 MG/ML IJ SOLN
12.5000 mg | Freq: Four times a day (QID) | INTRAMUSCULAR | Status: DC | PRN
  Filled 2017-11-20: qty 0.25

## 2017-11-20 MED ORDER — BISACODYL 10 MG RE SUPP
10.0000 mg | Freq: Every day | RECTAL | Status: DC | PRN
Start: 2017-11-20 — End: 2017-11-21
  Filled 2017-11-20: qty 1

## 2017-11-20 MED ORDER — OXYBUTYNIN CHLORIDE 5 MG PO TABS
5.0000 mg | ORAL_TABLET | Freq: Three times a day (TID) | ORAL | Status: DC | PRN
  Administered 2017-11-20 (×2): 5 mg via ORAL
  Filled 2017-11-20: qty 1

## 2017-11-20 MED ORDER — BELLADONNA ALKALOIDS-OPIUM 16.2-60 MG RE SUPP
1.0000 | Freq: Every day | RECTAL | Status: DC
  Administered 2017-11-20: 1 via RECTAL
  Filled 2017-11-20: qty 1

## 2017-11-20 MED ORDER — CEFAZOLIN SODIUM-DEXTROSE 2-4 GM/100ML-% IV SOLN
2.0000 g | INTRAVENOUS | Status: AC
  Administered 2017-11-20: 2 g via INTRAVENOUS
  Filled 2017-11-20: qty 100

## 2017-11-20 MED ORDER — MIDAZOLAM HCL 2 MG/2ML IJ SOLN
INTRAMUSCULAR | Status: AC
  Filled 2017-11-20: qty 2

## 2017-11-20 MED ORDER — HYDROMORPHONE HCL 1 MG/ML IJ SOLN
0.5000 mg | INTRAMUSCULAR | Status: DC | PRN
  Administered 2017-11-20 (×2): 0.5 mg via INTRAVENOUS
  Filled 2017-11-20: qty 1

## 2017-11-20 MED ORDER — DOCUSATE SODIUM 100 MG PO CAPS
100.0000 mg | ORAL_CAPSULE | Freq: Two times a day (BID) | ORAL | Status: DC
  Administered 2017-11-20 (×2): 100 mg via ORAL
  Filled 2017-11-20: qty 1

## 2017-11-20 MED ORDER — MEPERIDINE HCL 25 MG/ML IJ SOLN
6.2500 mg | INTRAMUSCULAR | Status: DC | PRN
  Filled 2017-11-20: qty 1

## 2017-11-20 MED ORDER — LABETALOL HCL 5 MG/ML IV SOLN
5.0000 mg | INTRAVENOUS | Status: DC | PRN
  Administered 2017-11-20: 5 mg via INTRAVENOUS
  Filled 2017-11-20: qty 4

## 2017-11-20 MED ORDER — FENTANYL CITRATE (PF) 100 MCG/2ML IJ SOLN
INTRAMUSCULAR | Status: DC | PRN
  Administered 2017-11-20 (×8): 25 ug via INTRAVENOUS

## 2017-11-20 MED ORDER — FLEET ENEMA 7-19 GM/118ML RE ENEM
1.0000 | ENEMA | Freq: Once | RECTAL | Status: DC | PRN
  Filled 2017-11-20: qty 1

## 2017-11-20 MED ORDER — PROPOFOL 10 MG/ML IV BOLUS
INTRAVENOUS | Status: DC | PRN
  Administered 2017-11-20: 100 mg via INTRAVENOUS
  Administered 2017-11-20: 50 mg via INTRAVENOUS
  Administered 2017-11-20: 200 mg via INTRAVENOUS

## 2017-11-20 MED ORDER — ONDANSETRON HCL 4 MG/2ML IJ SOLN
INTRAMUSCULAR | Status: DC | PRN
  Administered 2017-11-20: 4 mg via INTRAVENOUS

## 2017-11-20 MED ORDER — LABETALOL HCL 5 MG/ML IV SOLN
5.0000 mg | INTRAVENOUS | Status: AC | PRN
  Administered 2017-11-20 (×2): 5 mg via INTRAVENOUS
  Filled 2017-11-20: qty 4

## 2017-11-20 MED ORDER — SODIUM CHLORIDE 0.9 % IR SOLN
3000.0000 mL | Status: DC
  Administered 2017-11-20 (×3): 3000 mL
  Filled 2017-11-20: qty 3000

## 2017-11-20 MED ORDER — KETOROLAC TROMETHAMINE 30 MG/ML IJ SOLN
INTRAMUSCULAR | Status: AC
  Filled 2017-11-20: qty 1

## 2017-11-20 MED ORDER — ACETAMINOPHEN 325 MG PO TABS
650.0000 mg | ORAL_TABLET | ORAL | Status: DC | PRN
  Filled 2017-11-20: qty 2

## 2017-11-20 MED ORDER — LACTATED RINGERS IV SOLN
INTRAVENOUS | Status: DC
  Administered 2017-11-20 (×2): via INTRAVENOUS
  Filled 2017-11-20: qty 1000

## 2017-11-20 MED ORDER — DIAZEPAM 5 MG PO TABS
ORAL_TABLET | ORAL | Status: AC
  Filled 2017-11-20: qty 1

## 2017-11-20 MED ORDER — ZOLPIDEM TARTRATE 5 MG PO TABS
5.0000 mg | ORAL_TABLET | Freq: Every evening | ORAL | Status: DC | PRN
  Filled 2017-11-20: qty 1

## 2017-11-20 MED ORDER — DOCUSATE SODIUM 100 MG PO CAPS
ORAL_CAPSULE | ORAL | Status: AC
  Filled 2017-11-20: qty 1

## 2017-11-20 MED ORDER — BACITRACIN-NEOMYCIN-POLYMYXIN 400-5-5000 EX OINT
TOPICAL_OINTMENT | CUTANEOUS | Status: AC
  Filled 2017-11-20: qty 1

## 2017-11-20 MED ORDER — AMLODIPINE BESYLATE 5 MG PO TABS
5.0000 mg | ORAL_TABLET | Freq: Every morning | ORAL | Status: DC
  Filled 2017-11-20: qty 1

## 2017-11-20 MED ORDER — LIDOCAINE 2% (20 MG/ML) 5 ML SYRINGE
INTRAMUSCULAR | Status: AC
  Filled 2017-11-20: qty 5

## 2017-11-20 MED ORDER — CALCIUM CARBONATE ANTACID 500 MG PO CHEW
1.0000 | CHEWABLE_TABLET | ORAL | Status: DC | PRN
  Filled 2017-11-20: qty 1

## 2017-11-20 MED ORDER — OXYBUTYNIN CHLORIDE 5 MG PO TABS
ORAL_TABLET | ORAL | Status: AC
Start: 2017-11-20 — End: ?
  Filled 2017-11-20: qty 1

## 2017-11-20 MED ORDER — OXYBUTYNIN CHLORIDE 5 MG PO TABS
ORAL_TABLET | ORAL | Status: AC
  Filled 2017-11-20: qty 1

## 2017-11-20 MED ORDER — SODIUM CHLORIDE 0.9 % IR SOLN
Status: DC | PRN
  Administered 2017-11-20: 21000 mL

## 2017-11-20 SURGICAL SUPPLY — 34 items
BAG DRAIN URO-CYSTO SKYTR STRL (DRAIN) ×2 IMPLANT
BAG DRN ANRFLXCHMBR STRAP LEK (BAG)
BAG DRN UROCATH (DRAIN) ×1
BAG URINE DRAINAGE (UROLOGICAL SUPPLIES) ×1 IMPLANT
BAG URINE LEG 19OZ MD ST LTX (BAG) IMPLANT
CATH FOLEY 2WAY SLVR  5CC 20FR (CATHETERS)
CATH FOLEY 2WAY SLVR  5CC 22FR (CATHETERS)
CATH FOLEY 2WAY SLVR 30CC 20FR (CATHETERS) ×1 IMPLANT
CATH FOLEY 2WAY SLVR 5CC 20FR (CATHETERS) IMPLANT
CATH FOLEY 2WAY SLVR 5CC 22FR (CATHETERS) IMPLANT
CATH FOLEY 3WAY 30CC 22F (CATHETERS) ×1 IMPLANT
CATH HEMA 3WAY 30CC 24FR COUDE (CATHETERS) IMPLANT
CATH HEMA 3WAY 30CC 24FR RND (CATHETERS) IMPLANT
CLOTH BEACON ORANGE TIMEOUT ST (SAFETY) ×2 IMPLANT
EVACUATOR MICROVAS BLADDER (UROLOGICAL SUPPLIES) IMPLANT
GLOVE BIOGEL PI IND STRL 6.5 (GLOVE) IMPLANT
GLOVE BIOGEL PI IND STRL 7.5 (GLOVE) IMPLANT
GLOVE BIOGEL PI INDICATOR 6.5 (GLOVE) ×2
GLOVE BIOGEL PI INDICATOR 7.5 (GLOVE) ×2
GLOVE SURG SS PI 8.0 STRL IVOR (GLOVE) ×2 IMPLANT
GOWN STRL REUS W/ TWL XL LVL3 (GOWN DISPOSABLE) ×1 IMPLANT
GOWN STRL REUS W/TWL LRG LVL3 (GOWN DISPOSABLE) ×2 IMPLANT
GOWN STRL REUS W/TWL XL LVL3 (GOWN DISPOSABLE) ×2
HOLDER FOLEY CATH W/STRAP (MISCELLANEOUS) ×1 IMPLANT
IV NS IRRIG 3000ML ARTHROMATIC (IV SOLUTION) ×7 IMPLANT
KIT TURNOVER CYSTO (KITS) ×2 IMPLANT
LOOP CUT BIPOLAR 24F LRG (ELECTROSURGICAL) ×1 IMPLANT
MANIFOLD NEPTUNE II (INSTRUMENTS) ×1 IMPLANT
NS IRRIG 1000ML POUR BTL (IV SOLUTION) ×1 IMPLANT
PACK CYSTO (CUSTOM PROCEDURE TRAY) ×2 IMPLANT
PLUG CATH AND CAP STER (CATHETERS) IMPLANT
SYRINGE IRR TOOMEY STRL 70CC (SYRINGE) ×1 IMPLANT
TUBE CONNECTING 12X1/4 (SUCTIONS) ×1 IMPLANT
TUBING UROLOGY SET (TUBING) ×1 IMPLANT

## 2017-11-20 NOTE — Transfer of Care (Signed)
Immediate Anesthesia Transfer of Care Note  Patient: Joshua Bowers  Procedure(s) Performed: Procedure(s) (LRB): TRANSURETHRAL RESECTION OF THE PROSTATE (TURP) (N/A)  Patient Location: PACU  Anesthesia Type: General  Level of Consciousness: awake, sedated, patient cooperative and responds to stimulation  Airway & Oxygen Therapy: Patient Spontanous Breathing and Patient connected to NCO2  Post-op Assessment: Report given to PACU RN, Post -op Vital signs reviewed and stable and Patient moving all extremities  Post vital signs: Reviewed and stable  Complications: No apparent anesthesia complications

## 2017-11-20 NOTE — Op Note (Signed)
Preoperative diagnosis: 1. Bladder outlet obstruction secondary to BPH  Postoperative diagnosis:  1. Bladder outlet obstruction secondary to BPH  Procedure:  1. Cystoscopy 2. Transurethral Resection of the prostate  Surgeon: [name]. M.D.  Anesthesia: general  Complications: None  EBL: 125ml  Specimens: 1. Prostate chips  Disposition of specimens: Pathology  Indication: Joshua Bowers is a patient with bladder outlet obstruction secondary to benign prostatic hyperplasia. After reviewing the management options for treatment, he elected to proceed with the above surgical procedure(s). We have discussed the potential benefits and risks of the procedure, side effects of the proposed treatment, the likelihood of the patient achieving the goals of the procedure, and any potential problems that might occur during the procedure or recuperation. Informed consent has been obtained.  Description of procedure:  The patient was taken to the operating room and General anesthesia was induced.  The patient was placed in the dorsal lithotomy position, prepped and draped in the usual sterile fashion, and preoperative antibiotics were administered. A preoperative time-out was performed.   Cystourethroscopy was performed.  The patient's urethra was examined and demonstrated trilobar prostatic hypertrophy with a moderately large median lobe with obstruction.   The bladder was then systematically examined in its entirety. There was mild trabeculation with no evidence of any bladder tumors, stones, or other mucosal pathology.  The ureteral orifices were identified and marked so as to be avoided during the procedure.  The prostate adenoma was then resected utilizing loop cautery resection with the bipolar cutting loop.  The prostate middle lobe was resected along with the floor of the prostate back to the verumontanum was resected followed by  the right and left lobes of the prostate from bladder  neck to apex.  The initial chip removal and hemostasis was achieved and then apical and additional anterior prostate resection was performed to complete the resection.  Care was taken not to resect distal to the verumontanum.  At the completion of the procedure the bladder was evacuated free of chips and hemostasis was insured.  Final inspection revealed intact ureteral orifices, a widely patent TUR channel and an intact external sphincter.   The bladder was emptied and reinspected with no significant bleeding noted at the end of the procedure.  The bladder was partially refilled and the scope was removed.  Pressure on the bladder demonstrated an excellent flow.   A 73fr 3 way catheter was then placed into the bladder, irrigated with clear return and placed on continuous bladder irrigation.  The patient tolerated the procedure well and without complications.  The patient was able to be awakened and transferred to the recovery unit in satisfactory condition.

## 2017-11-20 NOTE — Anesthesia Postprocedure Evaluation (Signed)
Anesthesia Post Note  Patient: Joshua Bowers  Procedure(s) Performed: TRANSURETHRAL RESECTION OF THE PROSTATE (TURP) (N/A Prostate)     Patient location during evaluation: PACU Anesthesia Type: General Level of consciousness: awake and alert Pain management: pain level controlled Vital Signs Assessment: post-procedure vital signs reviewed and stable Respiratory status: spontaneous breathing, nonlabored ventilation, respiratory function stable and patient connected to nasal cannula oxygen Cardiovascular status: blood pressure returned to baseline and stable Postop Assessment: no apparent nausea or vomiting Anesthetic complications: no    Last Vitals:  Vitals:   11/20/17 1000 11/20/17 1015  BP: (!) 167/114 (!) 152/100  Pulse: 74 66  Resp: 19 15  Temp:    SpO2: 96% 97%    Last Pain:  Vitals:   11/20/17 0605  TempSrc:   PainSc: 0-No pain                 Jaquitta Dupriest,[place] A.

## 2017-11-20 NOTE — Progress Notes (Addendum)
11/20/2017 7:00 PM On call MD paged regarding pt. Continued severe pain and bladder spasms despite enacting prescribed interventions.  Dr. Clydene Laming with urology returned page. Current symptoms, assessment findings and medications already given reviewed. Verbal order received Valium 5 mg PO x1 and may repeat in 6 hours if needed for continued bladder spasms. Orders enacted. Will continue to closely monitor patient.  Jamen Loiseau, Arville Lime  7:10 PM Upon discussion of plan with patient, he declines wanting to take Valium at this time. He states he has taken it before and made him feel "funny". Pt. States he wants to continue with the current medication regimen. Pt. States he will let RN know if he changes his mind and wants the Valium.  Request honored. Will continue to closely monitor patient.  Micalah Cabezas, Arville Lime  7:20 PM RN called to patient room, pt. States he wants to take the Valium, orders enacted.  Alivya Wegman, Arville Lime

## 2017-11-20 NOTE — Anesthesia Procedure Notes (Signed)
Procedure Name: LMA Insertion Date/Time: 11/20/2017 7:34 AM Performed by: Justice Rocher, CRNA Pre-anesthesia Checklist: Patient identified, Emergency Drugs available, Suction available and Patient being monitored Patient Re-evaluated:Patient Re-evaluated prior to induction Oxygen Delivery Method: Circle system utilized Preoxygenation: Pre-oxygenation with 100% oxygen Induction Type: IV induction Ventilation: Mask ventilation without difficulty LMA: LMA inserted LMA Size: 5.0 Number of attempts: 1 Airway Equipment and Method: Bite block Placement Confirmation: positive ETCO2 and breath sounds checked- equal and bilateral Tube secured with: Tape Dental Injury: Teeth and Oropharynx as per pre-operative assessment

## 2017-11-20 NOTE — Interval H&P Note (Signed)
History and Physical Interval Note:  11/20/2017 7:13 AM  Joshua Bowers  has presented today for surgery, with the diagnosis of BENIGN PROSTATE HYPERPLASIA WIHT BLADDER OUTLET OBSTRUCTION  The various methods of treatment have been discussed with the patient and family. After consideration of risks, benefits and other options for treatment, the patient has consented to  Procedure(s): TRANSURETHRAL RESECTION OF THE PROSTATE (TURP) (N/A) as a surgical intervention .  The patient's history has been reviewed, patient examined, no change in status, stable for surgery.  I have reviewed the patient's chart and labs.  Questions were answered to the patient's satisfaction.     [name]

## 2017-11-20 NOTE — Discharge Instructions (Addendum)
Transurethral Resection of the Prostate, Care After Refer to this sheet in the next few weeks. These instructions provide you with information about caring for yourself after your procedure. Your health care provider may also give you more specific instructions. Your treatment has been planned according to current medical practices, but problems sometimes occur. Call your health care provider if you have any problems or questions after your procedure. What can I expect after the procedure? After the procedure, it is common to have:  Mild pain in your lower abdomen.  Soreness or mild discomfort in your penis from having the catheter inserted during the procedure.  A feeling of urgency when you need to urinate.  A small amount of blood in your urine. You may notice some small blood clots in your urine. These are normal.  Follow these instructions at home: Medicines   Take over-the-counter and prescription medicines only as told by your health care provider.  Do not drive or operate heavy machinery while taking prescription pain medicine.  Do not drive for 24 hours if you received a sedative.  If you were prescribed antibiotic medicine, take it as told by your health care provider. Do not stop taking the antibiotic even if you start to feel better. Activity  Return to your normal activities as told by your health care provider. Ask your health care provider what activities are safe for you.  Do not lift anything that is heavier than 10 lb (4.5 kg) for 3 weeks after your procedure, or as long as told by your health care provider.  Avoid intense physical activity for as long as told by your health care provider.  Walk at least one time every day. This helps to prevent blood clots. You may increase your physical activity gradually as you start to feel better. Lifestyle  Do not drink alcohol for as long as told by your health care provider. This is especially important if you are taking  prescription pain medicines.  Do not engage in sexual activity until your health care provider says that you can do this. General instructions  Do not take baths, swim, or use a hot tub until your health care provider approves.  Drink enough fluid to keep your urine clear or pale yellow.  Urinate as soon as you feel the need to. Do not try to hold your urine for long periods of time.  If your health care provider approves, you may take a stool softener for 2-3 weeks to prevent you from straining to have a bowel movement.  Wear compression stockings as told by your health care provider. These stockings help to prevent blood clots and reduce swelling in your legs.  Keep all follow-up visits as told by your health care provider. This is important. Contact a health care provider if:  You have difficulty urinating.  You have a fever.  You have pain that gets worse or does not improve with medicine.  You have blood in your urine that does not go away after 1 week of resting and drinking more fluids.  You have swelling in your penis or testicles. Get help right away if:  You are unable to urinate.  You are having more blood clots in your urine instead of fewer.  You have: ? Large blood clots. ? A lot of blood in your urine. ? Pain in your back or lower abdomen. ? Pain or swelling in your legs. ? Chills and you are shaking.   Please hold the aspirin  for 1 week from surgery.  This information is not intended to replace advice given to you by your health care provider. Make sure you discuss any questions you have with your health care provider. Document Released: 05/08/2005 Document Revised: 01/09/2016 Document Reviewed: 01/28/2015 Elsevier Interactive Patient Education  2017 [employer].

## 2017-11-21 DIAGNOSIS — N320 Bladder-neck obstruction: Secondary | ICD-10-CM | POA: Diagnosis not present

## 2017-11-21 LAB — HIV ANTIBODY (ROUTINE TESTING W REFLEX): HIV Screen 4th Generation wRfx: NONREACTIVE

## 2017-11-21 MED ORDER — MENTHOL 3 MG MT LOZG
LOZENGE | OROMUCOSAL | Status: AC
  Filled 2017-11-21: qty 9

## 2017-11-21 MED ORDER — BELLADONNA ALKALOIDS-OPIUM 16.2-30 MG RE SUPP
RECTAL | Status: AC
  Filled 2017-11-21: qty 1

## 2017-11-21 MED ORDER — MENTHOL 3 MG MT LOZG
1.0000 | LOZENGE | OROMUCOSAL | Status: DC | PRN
  Administered 2017-11-21: 3 mg via ORAL
  Filled 2017-11-21: qty 9

## 2017-11-21 MED ORDER — HYDROCODONE-ACETAMINOPHEN 5-325 MG PO TABS: 1.0000 | ORAL_TABLET | Freq: Four times a day (QID) | ORAL | 0 refills | Status: DC | PRN

## 2017-11-21 MED ORDER — OXYCODONE HCL 5 MG PO TABS
ORAL_TABLET | ORAL | Status: AC
  Filled 2017-11-21: qty 1

## 2017-11-21 MED ORDER — PHENOL 1.4 % MT LIQD
1.0000 | OROMUCOSAL | Status: DC | PRN
  Filled 2017-11-21: qty 177

## 2017-11-21 NOTE — Discharge Summary (Signed)
Physician Discharge Summary  Patient ID: MUNG RINKER MRN: [id number] DOB/AGE: Dec 13, 1950 67 y.o.  Admit date: 11/20/2017 Discharge date: 11/21/2017  Admission Diagnoses:  BPH with obstruction/lower urinary tract symptoms  Discharge Diagnoses:  Principal Problem:   BPH with obstruction/lower urinary tract symptoms   Past Medical History:  Diagnosis Date  . Arthritis    hands  . Bladder outlet obstruction   . BPH (benign prostatic hyperplasia)   . Elevated PSA    per pt 2013  prostate bx negative for cancer  . Exposure to asbestos    per pt followed by a law firm in [place],  told last CT 2018 okay  . GERD (gastroesophageal reflux disease)   . History of bladder cancer    per pt 2011-- s/p  TURBT superficial per pt  . Hypertension   . Incomplete right bundle branch block   . Lower urinary tract symptoms (LUTS)     Surgeries: Procedure(s): TRANSURETHRAL RESECTION OF THE PROSTATE (TURP) on 11/20/2017   Consultants (if any):   Discharged Condition: Improved  Hospital Course: SKANDA WORLDS is an 67 y.o. male who was admitted 11/20/2017 with a diagnosis of BPH with obstruction/lower urinary tract symptoms and went to the operating room on 11/20/2017 and underwent the above named procedures.  He had bladder spasms through the night but his urine has cleared and the catheter is draining well.  The foley and IV were removed and he will be discharged when voiding.   He was given perioperative antibiotics:  Anti-infectives (From admission, onward)   Start     Dose/Rate Route Frequency Ordered Stop   11/20/17 0559  ceFAZolin (ANCEF) IVPB 2g/100 mL premix     2 g 200 mL/hr over 30 Minutes Intravenous 30 min pre-op 11/20/17 0559 11/20/17 0802    .  He was given sequential compression devices for DVT prophylaxis.  He benefited maximally from the hospital stay and there were no complications.    Recent vital signs:  Vitals:   11/21/17 0100 11/21/17 0545  BP: 130/89 138/87   Pulse: 61 69  Resp: 17 16  Temp: 98.8 F (37.1 C) 98.7 F (37.1 C)  SpO2: 97% 97%    Recent laboratory studies:  Lab Results  Component Value Date   HGB 16.0 11/20/2017   HGB 17.3 (H) 04/29/2014   No results found for: WBC, PLT No results found for: INR Lab Results  Component Value Date   NA 141 11/20/2017   K 3.9 11/20/2017   CL 103 11/20/2017   BUN 19 11/20/2017   CREATININE 1.10 11/20/2017   GLUCOSE 97 11/20/2017    Discharge Medications:   Allergies as of 11/21/2017   No Known Allergies     Medication List    TAKE these medications   amLODipine 5 MG tablet Commonly known as:  NORVASC Take 5 mg by mouth every morning.   aspirin 325 MG EC tablet Take 325 mg by mouth daily.   calcium carbonate 500 MG chewable tablet Commonly known as:  TUMS - dosed in mg elemental calcium Chew 1 tablet by mouth as needed for indigestion or heartburn.   FISH OIL PO Take 1 capsule by mouth daily.   HYDROcodone-acetaminophen 5-325 MG tablet Commonly known as:  NORCO Take 1 tablet by mouth every 6 (six) hours as needed for moderate pain.   losartan 50 MG tablet Commonly known as:  COZAAR Take 50 mg by mouth every morning.   multivitamin with minerals Tabs tablet Take  1 tablet by mouth daily.   VITAMIN B 12 PO Take 1 tablet by mouth daily.       Diagnostic Studies: No results found.  Disposition: Discharge disposition: 01-Home or Self Care       Discharge Instructions    Discontinue IV   Complete by:  As directed       Follow-up Information    Jed Limerick, NP On 12/04/2017.   Specialty:  Urology Why:  [address] information: [place] [address] [place] [address] [phone]            Signed: [name] 11/21/2017, 7:11 AM

## 2017-11-26 ENCOUNTER — Encounter (HOSPITAL_BASED_OUTPATIENT_CLINIC_OR_DEPARTMENT_OTHER): Payer: Self-pay | Admitting: Urology

## 2020-06-02 ENCOUNTER — Ambulatory Visit: Payer: Self-pay | Admitting: Orthopedic Surgery

## 2020-07-01 NOTE — Patient Instructions (Addendum)
DUE TO COVID-19 ONLY ONE VISITOR IS ALLOWED TO COME WITH YOU AND STAY IN THE WAITING ROOM ONLY DURING PRE OP AND PROCEDURE DAY OF SURGERY. THE 1 VISITOR  MAY VISIT WITH YOU AFTER SURGERY IN YOUR PRIVATE ROOM DURING VISITING HOURS ONLY!  YOU NEED TO HAVE A COVID 19 TEST ON_2/19______ @_9 :10______, THIS TEST MUST BE DONE BEFORE SURGERY,  COVID TESTING SITE [place] [id number], IT IS ON THE RIGHT GOING OUT [address] APPROXIMATELY  2 MINUTES PAST [employer] ON THE RIGHT. ONCE YOUR COVID TEST IS COMPLETED,  PLEASE BEGIN THE QUARANTINE INSTRUCTIONS AS OUTLINED IN YOUR HANDOUT.                Joshua Bowers    Your procedure is scheduled on: 07/14/20   Report to [facility] Main  Entrance   Report to Short stay at 5:30 AM     Call this number if you have problems the morning of surgery [phone]   . BRUSH YOUR TEETH MORNING OF SURGERY AND RINSE YOUR MOUTH OUT, NO CHEWING GUM CANDY OR MINTS.  No food after midnight.    You may have clear liquid until 4:30 AM.    At 4:00 AM drink pre surgery drink.   Nothing by mouth after 4:30 AM.    Take these medicines the morning of surgery with A SIP OF WATER: Amlodipine                                 You may not have any metal on your body including              piercings  Do not wear jewelry,  lotions, powders or deodorant                      Men may shave face and neck.   Do not bring valuables to the hospital. [place].  Contacts, dentures or bridgework may not be worn into surgery.       Patients discharged the day of surgery will not be allowed to drive home.   IF YOU ARE HAVING SURGERY AND GOING HOME THE SAME DAY, YOU MUST HAVE AN ADULT TO DRIVE YOU HOME AND BE WITH YOU FOR 24 HOURS. YOU MAY GO HOME BY TAXI OR UBER OR ORTHERWISE, BUT AN ADULT MUST ACCOMPANY YOU HOME AND STAY WITH YOU FOR 24 HOURS.  Name and phone number of your  driver:  Special Instructions: N/A              Please read over the following fact sheets you were given: _____________________________________________________________________             [facility] - Preparing for Surgery Before surgery, you can play an important role.   Because skin is not sterile, your skin needs to be as free of germs as possible.  You can reduce the number of germs on your skin by washing with CHG (chlorahexidine gluconate) soap before surgery.  CHG is an antiseptic cleaner which kills germs and bonds with the skin to continue killing germs even after washing. Please DO NOT use if you have an allergy to CHG or antibacterial soaps.  If your skin becomes reddened/irritated stop using the CHG and inform your nurse when  you arrive at Short Stay. .  You may shave your face/neck.  Please follow these instructions carefully:  1.  Shower with CHG Soap the night before surgery and the  morning of Surgery.  2.  If you choose to wash your hair, wash your hair first as usual with your  normal  shampoo.  3.  After you shampoo, rinse your hair and body thoroughly to remove the  shampoo.                                        4.  Use CHG as you would any other liquid soap.  You can apply chg directly  to the skin and wash                       Gently with a scrungie or clean washcloth.  5.  Apply the CHG Soap to your body ONLY FROM THE NECK DOWN.   Do not use on face/ open                           Wound or open sores. Avoid contact with eyes, ears mouth and genitals (private parts).                       Wash face,  Genitals (private parts) with your normal soap.             6.  Wash thoroughly, paying special attention to the area where your surgery  will be performed.  7.  Thoroughly rinse your body with warm water from the neck down.  8.  DO NOT shower/wash with your normal soap after using and rinsing off  the CHG Soap.             9.  Pat yourself dry with a clean towel.             10.  Wear clean pajamas.            11.  Place clean sheets on your bed the night of your first shower and do not  sleep with pets. Day of Surgery : Do not apply any lotions/deodorants the morning of surgery.  Please wear clean clothes to the hospital/surgery center.  FAILURE TO FOLLOW THESE INSTRUCTIONS MAY RESULT IN THE CANCELLATION OF YOUR SURGERY PATIENT SIGNATURE_________________________________  NURSE SIGNATURE__________________________________  ________________________________________________________________________   Joshua Bowers  An incentive spirometer is a tool that can help keep your lungs clear and active. This tool measures how well you are filling your lungs with each breath. Taking long deep breaths may help reverse or decrease the chance of developing breathing (pulmonary) problems (especially infection) following:  A long period of time when you are unable to move or be active. BEFORE THE PROCEDURE   If the spirometer includes an indicator to show your best effort, your nurse or respiratory therapist will set it to a desired goal.  If possible, sit up straight or lean slightly forward. Try not to slouch.  Hold the incentive spirometer in an upright position. INSTRUCTIONS FOR USE  1. Sit on the edge of your bed if possible, or sit up as far as you can in bed or on a chair. 2. Hold the incentive spirometer in an upright position. 3. Breathe out normally. 4. Place the mouthpiece in your  mouth and seal your lips tightly around it. 5. Breathe in slowly and as deeply as possible, raising the piston or the ball toward the top of the column. 6. Hold your breath for 3-5 seconds or for as long as possible. Allow the piston or ball to fall to the bottom of the column. 7. Remove the mouthpiece from your mouth and breathe out normally. 8. Rest for a few seconds and repeat Steps 1 through 7 at least 10 times every 1-2 hours when you are awake. Take your time and  take a few normal breaths between deep breaths. 9. The spirometer may include an indicator to show your best effort. Use the indicator as a goal to work toward during each repetition. 10. After each set of 10 deep breaths, practice coughing to be sure your lungs are clear. If you have an incision (the cut made at the time of surgery), support your incision when coughing by placing a pillow or rolled up towels firmly against it. Once you are able to get out of bed, walk around indoors and cough well. You may stop using the incentive spirometer when instructed by your caregiver.  RISKS AND COMPLICATIONS  Take your time so you do not get dizzy or light-headed.  If you are in pain, you may need to take or ask for pain medication before doing incentive spirometry. It is harder to take a deep breath if you are having pain. AFTER USE  Rest and breathe slowly and easily.  It can be helpful to keep track of a log of your progress. Your caregiver can provide you with a simple table to help with this. If you are using the spirometer at home, follow these instructions: [place] IF:   You are having difficultly using the spirometer.  You have trouble using the spirometer as often as instructed.  Your pain medication is not giving enough relief while using the spirometer.  You develop fever of 100.5 F (38.1 C) or higher. SEEK IMMEDIATE MEDICAL CARE IF:   You cough up bloody sputum that had not been present before.  You develop fever of 102 F (38.9 C) or greater.  You develop worsening pain at or near the incision site. MAKE SURE YOU:   Understand these instructions.  Will watch your condition.  Will get help right away if you are not doing well or get worse. Document Released: 09/18/2006 Document Revised: 07/31/2011 Document Reviewed: 11/19/2006 [facility] Patient Information 2014 [place], [place].   ________________________________________________________________________

## 2020-07-05 ENCOUNTER — Other Ambulatory Visit: Payer: Self-pay

## 2020-07-05 ENCOUNTER — Encounter (HOSPITAL_COMMUNITY)
Admission: RE | Admit: 2020-07-05 | Discharge: 2020-07-05 | Disposition: A | Discharge status: Home / Self Care | Payer: Medicare Other | Source: Ambulatory Visit | Attending: General Practice | Admitting: General Practice

## 2020-07-05 ENCOUNTER — Encounter (HOSPITAL_COMMUNITY): Payer: Self-pay

## 2020-07-05 DIAGNOSIS — Z01818 Encounter for other preprocedural examination: Secondary | ICD-10-CM | POA: Insufficient documentation

## 2020-07-05 DIAGNOSIS — I10 Essential (primary) hypertension: Secondary | ICD-10-CM | POA: Diagnosis not present

## 2020-07-05 LAB — URINALYSIS, ROUTINE W REFLEX MICROSCOPIC
Bilirubin Urine: NEGATIVE
Glucose, UA: NEGATIVE mg/dL
Hgb urine dipstick: NEGATIVE
Ketones, ur: NEGATIVE mg/dL
Leukocytes,Ua: NEGATIVE
Nitrite: NEGATIVE
Protein, ur: NEGATIVE mg/dL
Specific Gravity, Urine: 1.009 (ref 1.005–1.030)
pH: 6 (ref 5.0–8.0)

## 2020-07-05 LAB — CBC
HCT: 48.1 % (ref 39.0–52.0)
Hemoglobin: 16.2 g/dL (ref 13.0–17.0)
MCH: 31.2 pg (ref 26.0–34.0)
MCHC: 33.7 g/dL (ref 30.0–36.0)
MCV: 92.5 fL (ref 80.0–100.0)
Platelets: 343 10*3/uL (ref 150–400)
RBC: 5.2 MIL/uL (ref 4.22–5.81)
RDW: 12.9 % (ref 11.5–15.5)
WBC: 6.1 10*3/uL (ref 4.0–10.5)
nRBC: 0 % (ref 0.0–0.2)

## 2020-07-05 LAB — BASIC METABOLIC PANEL
Anion gap: 10 (ref 5–15)
BUN: 16 mg/dL (ref 8–23)
CO2: 28 mmol/L (ref 22–32)
Calcium: 9.2 mg/dL (ref 8.9–10.3)
Chloride: 101 mmol/L (ref 98–111)
Creatinine, Ser: 1.27 mg/dL — ABNORMAL HIGH (ref 0.61–1.24)
GFR, Estimated: >60 mL/min (ref >60)
Glucose, Bld: 102 mg/dL — ABNORMAL HIGH (ref 70–99)
Potassium: 4.7 mmol/L (ref 3.5–5.1)
Sodium: 139 mmol/L (ref 135–145)

## 2020-07-05 LAB — SURGICAL PCR SCREEN
MRSA, PCR: NEGATIVE
Staphylococcus aureus: POSITIVE — AB

## 2020-07-05 LAB — PROTIME-INR
INR: 1 (ref 0.8–1.2)
Prothrombin Time: 13 seconds (ref 11.4–15.2)

## 2020-07-05 LAB — APTT: aPTT: 32 seconds (ref 24–36)

## 2020-07-05 NOTE — Progress Notes (Signed)
COVID Vaccine Completed:Yes Date COVID Vaccine completed:08/01/19 booster 03/17/20 COVID vaccine manufacturer:    Moderna     PCP - Dr. Anthonette Legato Cardiologist - none  Chest x-ray - no EKG - 07/05/20 chart,-epic Stress Test - no ECHO - no Cardiac Cath - no Pacemaker/ICD device last checked:NA  Sleep Study - no CPAP -   Fasting Blood Sugar - NA Checks Blood Sugar _____ times a day  Blood Thinner Instructions:ASA 325/ Dr. Manuella Ghazi Aspirin Instructions: Stop 10 days prior to DOS/ [place] Last Dose:07/04/20  Anesthesia review:   Patient denies shortness of breath, fever, cough and chest pain at PAT appointment yes  Patient verbalized understanding of instructions that were given to them at the PAT appointment. Patient was also instructed that they will need to review over the PAT instructions again at home before surgery.yes Pt has no SOB climbing stairs, doing housework or with ADLs.he denies having real insufficiency.

## 2020-07-07 ENCOUNTER — Ambulatory Visit: Payer: Self-pay | Admitting: Orthopedic Surgery

## 2020-07-10 ENCOUNTER — Other Ambulatory Visit (HOSPITAL_COMMUNITY)
Admission: RE | Admit: 2020-07-10 | Discharge: 2020-07-10 | Disposition: A | Discharge status: Home / Self Care | Payer: Medicare Other | Source: Ambulatory Visit | Attending: Specialist | Admitting: Specialist

## 2020-07-10 DIAGNOSIS — Z20822 Contact with and (suspected) exposure to covid-19: Secondary | ICD-10-CM | POA: Diagnosis not present

## 2020-07-10 DIAGNOSIS — Z01812 Encounter for preprocedural laboratory examination: Secondary | ICD-10-CM | POA: Diagnosis present

## 2020-07-10 LAB — SARS CORONAVIRUS 2 (TAT 6-24 HRS): SARS Coronavirus 2: NEGATIVE

## 2020-07-13 NOTE — Anesthesia Preprocedure Evaluation (Addendum)
Anesthesia Evaluation  Patient identified by MRN, date of birth, ID band Patient awake    Reviewed: Allergy & Precautions, H&P , NPO status , Patient's Chart, lab work & pertinent test results  Airway Mallampati: II  TM Distance: >3 FB Neck ROM: Full    Dental no notable dental hx. (+) Teeth Intact, Dental Advisory Given   Pulmonary neg pulmonary ROS,    Pulmonary exam normal breath sounds clear to auscultation       Cardiovascular Exercise Tolerance: Good hypertension, Pt. on medications  Rhythm:Regular Rate:Normal     Neuro/Psych negative neurological ROS  negative psych ROS   GI/Hepatic negative GI ROS, Neg liver ROS,   Endo/Other  negative endocrine ROS  Renal/GU negative Renal ROS  negative genitourinary   Musculoskeletal  (+) Arthritis , Osteoarthritis,    Abdominal   Peds  Hematology negative hematology ROS (+)   Anesthesia Other Findings   Reproductive/Obstetrics negative OB ROS                            Anesthesia Physical Anesthesia Plan  ASA: II  Anesthesia Plan: Spinal   Post-op Pain Management:  Regional for Post-op pain   Induction: Intravenous  PONV Risk Score and Plan: 2 and Ondansetron, Dexamethasone, Propofol infusion and Midazolam  Airway Management Planned: Simple Face Mask  Additional Equipment:   Intra-op Plan:   Post-operative Plan:   Informed Consent: I have reviewed the patients History and Physical, chart, labs and discussed the procedure including the risks, benefits and alternatives for the proposed anesthesia with the patient or authorized representative who has indicated his/her understanding and acceptance.     Dental advisory given  Plan Discussed with: CRNA  Anesthesia Plan Comments:        Anesthesia Quick Evaluation

## 2020-07-14 ENCOUNTER — Inpatient Hospital Stay (HOSPITAL_COMMUNITY)
Admission: RE | Admit: 2020-07-14 | Discharge: 2020-07-17 | DRG: 470 | Disposition: A | Discharge status: Home Health | Payer: Medicare Other | Source: Ambulatory Visit | Attending: Specialist | Admitting: Specialist

## 2020-07-14 ENCOUNTER — Ambulatory Visit (HOSPITAL_COMMUNITY): Payer: Medicare Other | Admitting: Anesthesiology

## 2020-07-14 ENCOUNTER — Encounter (HOSPITAL_COMMUNITY): Payer: Self-pay | Admitting: Specialist

## 2020-07-14 ENCOUNTER — Encounter (HOSPITAL_COMMUNITY)
Admission: RE | Disposition: A | Discharge status: Home Health | Payer: Self-pay | Source: Ambulatory Visit | Attending: Specialist

## 2020-07-14 ENCOUNTER — Ambulatory Visit (HOSPITAL_COMMUNITY): Payer: Medicare Other

## 2020-07-14 DIAGNOSIS — M1712 Unilateral primary osteoarthritis, left knee: Principal | ICD-10-CM | POA: Diagnosis present

## 2020-07-14 DIAGNOSIS — K219 Gastro-esophageal reflux disease without esophagitis: Secondary | ICD-10-CM | POA: Diagnosis present

## 2020-07-14 DIAGNOSIS — Z79899 Other long term (current) drug therapy: Secondary | ICD-10-CM

## 2020-07-14 DIAGNOSIS — Z8551 Personal history of malignant neoplasm of bladder: Secondary | ICD-10-CM

## 2020-07-14 DIAGNOSIS — M21162 Varus deformity, not elsewhere classified, left knee: Secondary | ICD-10-CM | POA: Diagnosis present

## 2020-07-14 DIAGNOSIS — Z96652 Presence of left artificial knee joint: Secondary | ICD-10-CM

## 2020-07-14 DIAGNOSIS — I10 Essential (primary) hypertension: Secondary | ICD-10-CM | POA: Diagnosis present

## 2020-07-14 DIAGNOSIS — Z96659 Presence of unspecified artificial knee joint: Secondary | ICD-10-CM

## 2020-07-14 DIAGNOSIS — Z7982 Long term (current) use of aspirin: Secondary | ICD-10-CM

## 2020-07-14 HISTORY — PX: TOTAL KNEE ARTHROPLASTY: SHX125

## 2020-07-14 SURGERY — ARTHROPLASTY, KNEE, TOTAL
Anesthesia: Spinal | Site: Knee | Laterality: Left

## 2020-07-14 MED ORDER — IRRISEPT - 450ML BOTTLE WITH 0.05% CHG IN STERILE WATER, USP 99.95% OPTIME
TOPICAL | Status: DC | PRN
  Administered 2020-07-14: 450 mL via TOPICAL

## 2020-07-14 MED ORDER — OXYCODONE HCL 5 MG PO TABS
10.0000 mg | ORAL_TABLET | ORAL | Status: DC | PRN
  Administered 2020-07-15 – 2020-07-17 (×9): 15 mg via ORAL
  Administered 2020-07-17: 10 mg via ORAL
  Filled 2020-07-14 (×9): qty 3

## 2020-07-14 MED ORDER — ALUM & MAG HYDROXIDE-SIMETH 200-200-20 MG/5ML PO SUSP: 30.0000 mL | ORAL | Status: DC | PRN

## 2020-07-14 MED ORDER — PHENYLEPHRINE HCL-NACL 10-0.9 MG/250ML-% IV SOLN
INTRAVENOUS | Status: DC | PRN
  Administered 2020-07-14: 30 ug/min via INTRAVENOUS

## 2020-07-14 MED ORDER — CHLORHEXIDINE GLUCONATE 0.12 % MT SOLN
15.0000 mL | Freq: Once | OROMUCOSAL | Status: AC
  Administered 2020-07-14: 15 mL via OROMUCOSAL

## 2020-07-14 MED ORDER — ONDANSETRON HCL 4 MG/2ML IJ SOLN
INTRAMUSCULAR | Status: AC
  Filled 2020-07-14: qty 2

## 2020-07-14 MED ORDER — SODIUM CHLORIDE 0.9% FLUSH
INTRAVENOUS | Status: DC | PRN
  Administered 2020-07-14 (×2): 1000 mL
  Administered 2020-07-14: 20 mL

## 2020-07-14 MED ORDER — HYDROMORPHONE HCL 1 MG/ML IJ SOLN: 0.2500 mg | INTRAMUSCULAR | Status: DC | PRN

## 2020-07-14 MED ORDER — DIPHENHYDRAMINE HCL 12.5 MG/5ML PO ELIX: 12.5000 mg | ORAL_SOLUTION | ORAL | Status: DC | PRN

## 2020-07-14 MED ORDER — PROPOFOL 1000 MG/100ML IV EMUL
INTRAVENOUS | Status: AC
  Filled 2020-07-14: qty 100

## 2020-07-14 MED ORDER — TRANEXAMIC ACID-NACL 1000-0.7 MG/100ML-% IV SOLN
1000.0000 mg | INTRAVENOUS | Status: AC
  Administered 2020-07-14: 1000 mg via INTRAVENOUS
  Filled 2020-07-14: qty 100

## 2020-07-14 MED ORDER — ACETAMINOPHEN 500 MG PO TABS
1000.0000 mg | ORAL_TABLET | Freq: Four times a day (QID) | ORAL | Status: AC
  Administered 2020-07-14 – 2020-07-15 (×3): 1000 mg via ORAL
  Filled 2020-07-14 (×4): qty 2

## 2020-07-14 MED ORDER — FENTANYL CITRATE (PF) 100 MCG/2ML IJ SOLN
INTRAMUSCULAR | Status: DC | PRN
  Administered 2020-07-14 (×2): 50 ug via INTRAVENOUS

## 2020-07-14 MED ORDER — TIZANIDINE HCL 4 MG PO TABS: 4.0000 mg | ORAL_TABLET | Freq: Two times a day (BID) | ORAL | 1 refills | Status: AC | PRN

## 2020-07-14 MED ORDER — ASPIRIN EC 81 MG PO TBEC: 81.0000 mg | DELAYED_RELEASE_TABLET | Freq: Two times a day (BID) | ORAL | 1 refills | Status: DC

## 2020-07-14 MED ORDER — BUPIVACAINE-EPINEPHRINE 0.5% -1:200000 IJ SOLN
INTRAMUSCULAR | Status: AC
  Filled 2020-07-14: qty 1

## 2020-07-14 MED ORDER — METOCLOPRAMIDE HCL 5 MG PO TABS: 5.0000 mg | ORAL_TABLET | Freq: Three times a day (TID) | ORAL | Status: DC | PRN

## 2020-07-14 MED ORDER — FENTANYL CITRATE (PF) 100 MCG/2ML IJ SOLN
INTRAMUSCULAR | Status: AC
  Filled 2020-07-14: qty 2

## 2020-07-14 MED ORDER — CEFAZOLIN SODIUM-DEXTROSE 2-4 GM/100ML-% IV SOLN
2.0000 g | INTRAVENOUS | Status: AC
  Administered 2020-07-14: 2 g via INTRAVENOUS
  Filled 2020-07-14: qty 100

## 2020-07-14 MED ORDER — PHENOL 1.4 % MT LIQD: 1.0000 | OROMUCOSAL | Status: DC | PRN

## 2020-07-14 MED ORDER — CLINDAMYCIN PHOSPHATE 900 MG/50ML IV SOLN
900.0000 mg | INTRAVENOUS | Status: AC
  Administered 2020-07-14: 900 mg via INTRAVENOUS
  Filled 2020-07-14: qty 50

## 2020-07-14 MED ORDER — PHENYLEPHRINE 40 MCG/ML (10ML) SYRINGE FOR IV PUSH (FOR BLOOD PRESSURE SUPPORT)
PREFILLED_SYRINGE | INTRAVENOUS | Status: DC | PRN
  Administered 2020-07-14: 120 ug via INTRAVENOUS
  Administered 2020-07-14: 80 ug via INTRAVENOUS
  Administered 2020-07-14: 40 ug via INTRAVENOUS

## 2020-07-14 MED ORDER — POLYETHYLENE GLYCOL 3350 17 G PO PACK: 17.0000 g | PACK | Freq: Every day | ORAL | Status: DC | PRN

## 2020-07-14 MED ORDER — BUPIVACAINE IN DEXTROSE 0.75-8.25 % IT SOLN
INTRATHECAL | Status: DC | PRN
  Administered 2020-07-14: 2 mL via INTRATHECAL

## 2020-07-14 MED ORDER — OXYCODONE HCL 5 MG PO TABS
5.0000 mg | ORAL_TABLET | ORAL | Status: DC | PRN
  Administered 2020-07-14 – 2020-07-15 (×5): 10 mg via ORAL
  Filled 2020-07-14 (×6): qty 2

## 2020-07-14 MED ORDER — CEFAZOLIN SODIUM-DEXTROSE 2-4 GM/100ML-% IV SOLN
2.0000 g | Freq: Four times a day (QID) | INTRAVENOUS | Status: AC
  Administered 2020-07-14 (×2): 2 g via INTRAVENOUS
  Filled 2020-07-14 (×2): qty 100

## 2020-07-14 MED ORDER — ONDANSETRON HCL 4 MG/2ML IJ SOLN: 4.0000 mg | Freq: Four times a day (QID) | INTRAMUSCULAR | Status: DC | PRN

## 2020-07-14 MED ORDER — ORAL CARE MOUTH RINSE: 15.0000 mL | Freq: Once | OROMUCOSAL | Status: AC

## 2020-07-14 MED ORDER — ACETAMINOPHEN 325 MG PO TABS
325.0000 mg | ORAL_TABLET | Freq: Four times a day (QID) | ORAL | Status: DC | PRN
  Administered 2020-07-15: 650 mg via ORAL
  Filled 2020-07-14: qty 2

## 2020-07-14 MED ORDER — SODIUM CHLORIDE 0.9 % IR SOLN
Status: DC | PRN
  Administered 2020-07-14: 1000 mL

## 2020-07-14 MED ORDER — MENTHOL 3 MG MT LOZG
1.0000 | LOZENGE | OROMUCOSAL | Status: DC | PRN
  Filled 2020-07-14: qty 9

## 2020-07-14 MED ORDER — BUPIVACAINE-EPINEPHRINE (PF) 0.5% -1:200000 IJ SOLN
INTRAMUSCULAR | Status: DC | PRN
  Administered 2020-07-14: 20 mL via PERINEURAL

## 2020-07-14 MED ORDER — PHENYLEPHRINE HCL (PRESSORS) 10 MG/ML IV SOLN
INTRAVENOUS | Status: AC
  Filled 2020-07-14: qty 1

## 2020-07-14 MED ORDER — BUPIVACAINE-EPINEPHRINE (PF) 0.5% -1:200000 IJ SOLN
INTRAMUSCULAR | Status: DC | PRN
Start: 2020-07-14 — End: 2020-07-14
  Administered 2020-07-14: 20 mL via PERINEURAL

## 2020-07-14 MED ORDER — ASPIRIN 81 MG PO CHEW
81.0000 mg | CHEWABLE_TABLET | Freq: Two times a day (BID) | ORAL | Status: DC
  Administered 2020-07-14 – 2020-07-17 (×6): 81 mg via ORAL
  Filled 2020-07-14 (×6): qty 1

## 2020-07-14 MED ORDER — DEXAMETHASONE SODIUM PHOSPHATE 10 MG/ML IJ SOLN
INTRAMUSCULAR | Status: AC
  Filled 2020-07-14: qty 1

## 2020-07-14 MED ORDER — SODIUM CHLORIDE (PF) 0.9 % IJ SOLN
INTRAMUSCULAR | Status: AC
  Filled 2020-07-14: qty 20

## 2020-07-14 MED ORDER — ADULT MULTIVITAMIN W/MINERALS CH
1.0000 | ORAL_TABLET | Freq: Every day | ORAL | Status: DC
  Administered 2020-07-14 – 2020-07-17 (×4): 1 via ORAL
  Filled 2020-07-14 (×4): qty 1

## 2020-07-14 MED ORDER — PROPOFOL 10 MG/ML IV BOLUS
INTRAVENOUS | Status: AC
  Filled 2020-07-14: qty 20

## 2020-07-14 MED ORDER — 0.9 % SODIUM CHLORIDE (POUR BTL) OPTIME
TOPICAL | Status: DC | PRN
  Administered 2020-07-14: 1000 mL

## 2020-07-14 MED ORDER — DOCUSATE SODIUM 100 MG PO CAPS: 100.0000 mg | ORAL_CAPSULE | Freq: Two times a day (BID) | ORAL | 1 refills | Status: DC | PRN

## 2020-07-14 MED ORDER — LACTATED RINGERS IV SOLN: INTRAVENOUS | Status: DC

## 2020-07-14 MED ORDER — METOCLOPRAMIDE HCL 5 MG/ML IJ SOLN: 5.0000 mg | Freq: Three times a day (TID) | INTRAMUSCULAR | Status: DC | PRN

## 2020-07-14 MED ORDER — LOSARTAN POTASSIUM 50 MG PO TABS
100.0000 mg | ORAL_TABLET | Freq: Every morning | ORAL | Status: DC
Start: 2020-07-14 — End: 2020-07-17
  Administered 2020-07-14 – 2020-07-17 (×4): 100 mg via ORAL
  Filled 2020-07-14 (×4): qty 2

## 2020-07-14 MED ORDER — RISAQUAD PO CAPS
1.0000 | ORAL_CAPSULE | Freq: Every day | ORAL | Status: DC
  Administered 2020-07-14 – 2020-07-17 (×4): 1 via ORAL
  Filled 2020-07-14 (×4): qty 1

## 2020-07-14 MED ORDER — MIDAZOLAM HCL 2 MG/2ML IJ SOLN
INTRAMUSCULAR | Status: DC | PRN
  Administered 2020-07-14: 2 mg via INTRAVENOUS

## 2020-07-14 MED ORDER — ONDANSETRON HCL 4 MG PO TABS: 4.0000 mg | ORAL_TABLET | Freq: Four times a day (QID) | ORAL | Status: DC | PRN

## 2020-07-14 MED ORDER — PROPOFOL 10 MG/ML IV BOLUS
INTRAVENOUS | Status: DC | PRN
  Administered 2020-07-14: 20 mg via INTRAVENOUS

## 2020-07-14 MED ORDER — OXYCODONE-ACETAMINOPHEN 5-325 MG PO TABS: 1.0000 | ORAL_TABLET | ORAL | 0 refills | Status: DC | PRN

## 2020-07-14 MED ORDER — PHENYLEPHRINE 40 MCG/ML (10ML) SYRINGE FOR IV PUSH (FOR BLOOD PRESSURE SUPPORT)
PREFILLED_SYRINGE | INTRAVENOUS | Status: AC
  Filled 2020-07-14: qty 10

## 2020-07-14 MED ORDER — BUPIVACAINE LIPOSOME 1.3 % IJ SUSP
20.0000 mL | Freq: Once | INTRAMUSCULAR | Status: AC
  Administered 2020-07-14: 20 mL
  Filled 2020-07-14: qty 20

## 2020-07-14 MED ORDER — HYDROMORPHONE HCL 1 MG/ML IJ SOLN
0.5000 mg | INTRAMUSCULAR | Status: DC | PRN
  Administered 2020-07-14 – 2020-07-16 (×8): 1 mg via INTRAVENOUS
  Filled 2020-07-14 (×8): qty 1

## 2020-07-14 MED ORDER — PROPOFOL 500 MG/50ML IV EMUL
INTRAVENOUS | Status: DC | PRN
  Administered 2020-07-14: 125 ug/kg/min via INTRAVENOUS

## 2020-07-14 MED ORDER — BUPIVACAINE HCL (PF) 0.25 % IJ SOLN
INTRAMUSCULAR | Status: AC
  Filled 2020-07-14: qty 30

## 2020-07-14 MED ORDER — ASCORBIC ACID 500 MG PO TABS
500.0000 mg | ORAL_TABLET | Freq: Every day | ORAL | Status: DC
  Administered 2020-07-14 – 2020-07-17 (×4): 500 mg via ORAL
  Filled 2020-07-14 (×4): qty 1

## 2020-07-14 MED ORDER — ONDANSETRON HCL 4 MG/2ML IJ SOLN
INTRAMUSCULAR | Status: DC | PRN
  Administered 2020-07-14: 4 mg via INTRAVENOUS

## 2020-07-14 MED ORDER — MIDAZOLAM HCL 2 MG/2ML IJ SOLN
INTRAMUSCULAR | Status: AC
  Filled 2020-07-14: qty 2

## 2020-07-14 MED ORDER — MAGNESIUM CITRATE PO SOLN: 1.0000 | Freq: Once | ORAL | Status: DC | PRN

## 2020-07-14 MED ORDER — DEXAMETHASONE SODIUM PHOSPHATE 10 MG/ML IJ SOLN
INTRAMUSCULAR | Status: DC | PRN
  Administered 2020-07-14: 8 mg via INTRAVENOUS

## 2020-07-14 MED ORDER — DOCUSATE SODIUM 100 MG PO CAPS
100.0000 mg | ORAL_CAPSULE | Freq: Two times a day (BID) | ORAL | Status: DC
  Administered 2020-07-14 – 2020-07-17 (×7): 100 mg via ORAL
  Filled 2020-07-14 (×7): qty 1

## 2020-07-14 MED ORDER — BISACODYL 5 MG PO TBEC: 5.0000 mg | DELAYED_RELEASE_TABLET | Freq: Every day | ORAL | Status: DC | PRN

## 2020-07-14 MED ORDER — AMLODIPINE BESYLATE 5 MG PO TABS
5.0000 mg | ORAL_TABLET | Freq: Every morning | ORAL | Status: DC
  Administered 2020-07-15 – 2020-07-17 (×3): 5 mg via ORAL
  Filled 2020-07-14 (×3): qty 1

## 2020-07-14 MED ORDER — METHOCARBAMOL 500 MG IVPB - SIMPLE MED
500.0000 mg | Freq: Four times a day (QID) | INTRAVENOUS | Status: DC | PRN
  Filled 2020-07-14: qty 50

## 2020-07-14 MED ORDER — METHOCARBAMOL 500 MG PO TABS
500.0000 mg | ORAL_TABLET | Freq: Four times a day (QID) | ORAL | Status: DC | PRN
  Administered 2020-07-14 – 2020-07-17 (×10): 500 mg via ORAL
  Filled 2020-07-14 (×10): qty 1

## 2020-07-14 MED ORDER — KCL IN DEXTROSE-NACL 20-5-0.45 MEQ/L-%-% IV SOLN
INTRAVENOUS | Status: AC
  Filled 2020-07-14: qty 1000

## 2020-07-14 MED ORDER — ACETAMINOPHEN 10 MG/ML IV SOLN
1000.0000 mg | INTRAVENOUS | Status: AC
  Administered 2020-07-14: 1000 mg via INTRAVENOUS
  Filled 2020-07-14: qty 100

## 2020-07-14 SURGICAL SUPPLY — 82 items
AGENT HMST SPONGE THK3/8 (HEMOSTASIS)
ATTUNE MED DOME PAT 38 KNEE (Knees) ×1 IMPLANT
ATTUNE PS FEM LT SZ 7 CEM KNEE (Femur) ×1 IMPLANT
ATTUNE PSRP INSR SZ7 6 KNEE (Insert) ×1 IMPLANT
BAG DECANTER FOR FLEXI CONT (MISCELLANEOUS) ×1 IMPLANT
BAG SPEC THK2 15X12 ZIP CLS (MISCELLANEOUS) ×1
BAG ZIPLOCK 12X15 (MISCELLANEOUS) ×1 IMPLANT
BASE TIBIAL ROT PLAT SZ 7 KNEE (Knees) IMPLANT
BLADE SAG 18X100X1.27 (BLADE) ×2 IMPLANT
BLADE SAW SGTL 11.0X1.19X90.0M (BLADE) ×1 IMPLANT
BLADE SAW SGTL 13.0X1.19X90.0M (BLADE) ×2 IMPLANT
BLADE SURG SZ10 CARB STEEL (BLADE) ×4 IMPLANT
BNDG COHESIVE 4X5 TAN STRL (GAUZE/BANDAGES/DRESSINGS) ×2 IMPLANT
BNDG ELASTIC 4X5.8 VLCR STR LF (GAUZE/BANDAGES/DRESSINGS) ×2 IMPLANT
BNDG ELASTIC 6X5.8 VLCR STR LF (GAUZE/BANDAGES/DRESSINGS) ×2 IMPLANT
BSPLAT TIB 7 CMNT ROT PLAT STR (Knees) ×1 IMPLANT
CEMENT HV SMART SET (Cement) ×4 IMPLANT
COVER SURGICAL LIGHT HANDLE (MISCELLANEOUS) ×2 IMPLANT
COVER WAND RF STERILE (DRAPES) IMPLANT
CUFF TOURN SGL QUICK 34 (TOURNIQUET CUFF) ×2
CUFF TRNQT CYL 34X4.125X (TOURNIQUET CUFF) ×1 IMPLANT
DECANTER SPIKE VIAL GLASS SM (MISCELLANEOUS) ×1 IMPLANT
DRAPE INCISE IOBAN 66X45 STRL (DRAPES) ×1 IMPLANT
DRAPE ORTHO SPLIT 77X108 STRL (DRAPES) ×4
DRAPE SHEET LG 3/4 BI-LAMINATE (DRAPES) ×4 IMPLANT
DRAPE SURG ORHT 6 SPLT 77X108 (DRAPES) ×2 IMPLANT
DRAPE U-SHAPE 47X51 STRL (DRAPES) ×2 IMPLANT
DRSG AQUACEL AG ADV 3.5X10 (GAUZE/BANDAGES/DRESSINGS) ×1 IMPLANT
DRSG TEGADERM 4X4.75 (GAUZE/BANDAGES/DRESSINGS) IMPLANT
DURAPREP 26ML APPLICATOR (WOUND CARE) ×2 IMPLANT
ELECT BLADE TIP CTD 4 INCH (ELECTRODE) ×2 IMPLANT
ELECT REM PT RETURN 15FT ADLT (MISCELLANEOUS) ×2 IMPLANT
EVACUATOR 1/8 PVC DRAIN (DRAIN) IMPLANT
GAUZE SPONGE 2X2 8PLY STRL LF (GAUZE/BANDAGES/DRESSINGS) IMPLANT
GLOVE SRG 8 PF TXTR STRL LF DI (GLOVE) ×1 IMPLANT
GLOVE SURG POLYISO LF SZ8 (GLOVE) ×4 IMPLANT
GLOVE SURG SS PI 7.5 STRL IVOR (GLOVE) ×4 IMPLANT
GLOVE SURG UNDER POLY LF SZ7.5 (GLOVE) ×2 IMPLANT
GLOVE SURG UNDER POLY LF SZ8 (GLOVE) ×2
GOWN STRL REUS W/TWL XL LVL3 (GOWN DISPOSABLE) ×4 IMPLANT
HANDPIECE INTERPULSE COAX TIP (DISPOSABLE) ×2
HEMOSTAT SPONGE AVITENE ULTRA (HEMOSTASIS) IMPLANT
HOLDER FOLEY CATH W/STRAP (MISCELLANEOUS) ×1 IMPLANT
IMMOBILIZER KNEE 20 (SOFTGOODS) ×2
IMMOBILIZER KNEE 20 THIGH 36 (SOFTGOODS) ×1 IMPLANT
JET LAVAGE IRRISEPT WOUND (IRRIGATION / IRRIGATOR) ×2
KIT TURNOVER KIT A (KITS) ×2 IMPLANT
LAVAGE JET IRRISEPT WOUND (IRRIGATION / IRRIGATOR) ×1 IMPLANT
MANIFOLD NEPTUNE II (INSTRUMENTS) ×2 IMPLANT
NDL SAFETY ECLIPSE 18X1.5 (NEEDLE) IMPLANT
NEEDLE HYPO 18GX1.5 SHARP (NEEDLE) ×2
NS IRRIG 1000ML POUR BTL (IV SOLUTION) IMPLANT
PACK TOTAL KNEE CUSTOM (KITS) ×2 IMPLANT
PIN DRILL FIX HALF THREAD (BIT) ×1 IMPLANT
PIN STEINMAN FIXATION KNEE (PIN) ×1 IMPLANT
PROTECTOR NERVE ULNAR (MISCELLANEOUS) ×2 IMPLANT
SAW OSC TIP CART 19.5X105X1.3 (SAW) ×2 IMPLANT
SEALER BIPOLAR AQUA 6.0 (INSTRUMENTS) ×1 IMPLANT
SET HNDPC FAN SPRY TIP SCT (DISPOSABLE) ×1 IMPLANT
SLEEVE SCD COMPRESS KNEE MED (MISCELLANEOUS) ×1 IMPLANT
SPONGE GAUZE 2X2 STER 10/PKG (GAUZE/BANDAGES/DRESSINGS)
SPONGE SURGIFOAM ABS GEL 100 (HEMOSTASIS) IMPLANT
STAPLER VISISTAT (STAPLE) IMPLANT
STRIP CLOSURE SKIN 1/2X4 (GAUZE/BANDAGES/DRESSINGS) ×2 IMPLANT
SUT BONE WAX W31G (SUTURE) ×2 IMPLANT
SUT MNCRL AB 4-0 PS2 18 (SUTURE) IMPLANT
SUT STRATAFIX 0 PDS 27 VIOLET (SUTURE) ×2
SUT VIC AB 1 CT1 27 (SUTURE) ×6
SUT VIC AB 1 CT1 27XBRD ANTBC (SUTURE) ×2 IMPLANT
SUT VIC AB 1 CTX 36 (SUTURE)
SUT VIC AB 1 CTX36XBRD ANBCTR (SUTURE) IMPLANT
SUT VIC AB 2-0 CT1 27 (SUTURE) ×6
SUT VIC AB 2-0 CT1 TAPERPNT 27 (SUTURE) ×3 IMPLANT
SUTURE STRATFX 0 PDS 27 VIOLET (SUTURE) ×1 IMPLANT
SYR 3ML LL SCALE MARK (SYRINGE) IMPLANT
SYR 50ML LL SCALE MARK (SYRINGE) IMPLANT
TIBIAL BASE ROT PLAT SZ 7 KNEE (Knees) ×2 IMPLANT
TOWER CARTRIDGE SMART MIX (DISPOSABLE) ×2 IMPLANT
TRAY FOLEY MTR SLVR 16FR STAT (SET/KITS/TRAYS/PACK) ×2 IMPLANT
WATER STERILE IRR 1000ML POUR (IV SOLUTION) ×3 IMPLANT
WIPE CHG CHLORHEXIDINE 2% (PERSONAL CARE ITEMS) ×2 IMPLANT
WRAP KNEE MAXI GEL POST OP (GAUZE/BANDAGES/DRESSINGS) ×2 IMPLANT

## 2020-07-14 NOTE — Op Note (Signed)
NAME: Joshua Bowers, Joshua Bowers MEDICAL RECORD NO: [id number] ACCOUNT NO: [id number] DATE OF BIRTH: April 25, 1951 FACILITY: [facility] LOCATION: [facility] PHYSICIAN: [name], MD  Operative Report   DATE OF PROCEDURE: 07/14/2020  PREOPERATIVE DIAGNOSIS:  End-stage osteoarthritis, varus deformity of the left knee.  POSTOPERATIVE DIAGNOSIS:  End-stage osteoarthritis, varus deformity of the left knee.  PROCEDURE PERFORMED:  Left total knee arthroplasty utilizing DePuy Attune rotating platform 7 femur, 7 tibia, 6 mm insert, 38 patella.  ANESTHESIA:  Spinal.  ASSISTANT:  Lacie Draft, PA  HISTORY OF PRESENT ILLNESS:  This is a 70 year old male with end-stage osteoarthrosis medial compartment, severe varus deformity.  He was indicated for replacement of the degenerated joint.  Risks and benefits discussed including bleeding, infection,  damage to the neurovascular structures, no change in symptoms, worsening symptoms, DVT, PE, anesthetic complications, etc.  TECHNIQUE:  The patient in supine position.  After induction of adequate spinal anesthesia, 2 grams Kefzol and 600 clindamycin, the left lower extremity was prepped and draped and exsanguinated in the usual sterile fashion.  Thigh tourniquet inflated to  225 mmHg.  Midline incision based upon the patella was then made.  Full thickness flaps developed.  Median parapatellar arthrotomy was then performed.  Patella was everted.  Soft tissue elevated medially, preserving the MCL.  The patella was everted and  knee flexed.  Tricompartmental osteoarthrosis was noted, particularly medial compartment bone on bone.  A Leksell rongeur was utilized to remove osteophytes.  Remnants of the medial and lateral menisci were removed as well.  Leksell rongeur was utilized  to initiate a starting point for the reamer.  This is above the notch, detached above the attachment of the PCL.  We then drilled in line with the femur.  Irrigated it.  T-handled jig introduced.   Canal was intact.  I used an intramedullary distal  femoral cutting guide with 9 off the distal femur.  This was inserted, pinned 5-degree left.  After pinning, protecting the soft tissues, performed a distal femoral cut.  I then sized the femur off the anterior cortex at its high side.  This was a 7.  In  3 degrees of external rotation.  This was then pinned.  I then performed our anterior cut.  This was flushed with the femur.  A posterior cuts with a large curved Crego protecting the posterior soft tissues at all times.  The chamfer cuts were then  performed as well.  I removed the jig.  Residual bone removed.  A Crego and then a keel was utilized to sublux the tibia.  There was a defect medially with sclerotic bone.  I measured 9 off the high cartilage side on the lateral external alignment guide,  bisecting the tibiotalar joint 3-degree slope parallel to the shaft.  I then performed this cut protecting the soft tissues including the popliteus.  This was flush with a portion of the sclerotic bone medially.  We used the precision saw for this cut.   Next, I used a trial tibial tray at 7 just to the medial aspect of the tibial tubercle.  Maximizing the coverage.  This was then pinned.  Harvested bone from the tibial canal and impacted into the distal femoral canal.  I then drilled it centrally, used  a bare rongeur to remove sclerotic bone.  Then, the punch guide.  I used a 5 mm insert following this, reduced it and had full extension, full flexion, good stability to varus valgus stress at 0 and 30 degrees.  Negative anterior drawer.  Attention was  turned towards the patella, measured to a 24.  This was planed to a 15 utilizing the external jig.  The residual was measured at 15 mm.  I used first the 41 and then sized a 38 due to some irregularity in the patella.  I medialized the patella, used the  trial paddle that was parallel to the joint.  I then drilled the peg holes.  Placed a trial patella and  reduced it and had excellent patellofemoral tracking.  All trials were removed.  We checked posteriorly and removed any remnants of the menisci.  We  cauterized the geniculates with the Aquamantys.  Next, pulsatile lavage was used within the joint.  The cement was mixed on the back table in the appropriate fashion.  The knee was flexed, the tibia subluxed.  We performed drill holes multiple in the  sclerotic bone medially.  Thoroughly dried all surfaces.  I injected the cement to the tibia and digitally pressurized it.  Cement was placed on the tibial tray and this was inserted and impacted with redundant cement removed.  In similar fashion, we  cemented and impacted the femur.  Redundant cement removed.  I placed a 5 insert and reduced the knee held in axial load throughout the curing of the cement.  Residual cement removed.  I then, cemented and clamped the patella.  The knee was covered with  0.25% Marcaine with epinephrine was placed in the joint during the curing of the cement.  Following curing of the cement flexed and extended the knee.  We then tried a 6 mm insert, which I felt was a better fit.  The knee was flexed.  The inserts were  removed.  Redundant cement was meticulously removed.  I then used pulsatile lavage within the joint and antibiotic irrigation.  I used a 6 permanent insert placed it and reduced it.  I had full extension, full flexion, good stability to varus valgus  stressing at 0 and 30 degrees.  Negative anterior drawer.  Next, with the knee in some degree of flexion I repaired the patellar arthrotomy with #1 Vicryl in interrupted figure-of-eight sutures and then oversewed with a running Stratafix.  I flexion to  gravity at 90 degrees.  Excellent patellofemoral tracking, full flexion, good extension, good stability to varus valgus stressing at 0 and 30 degrees.  Negative anterior drawer.  Next, using antibiotic irrigation we had used pulsatile lavage prior to the  final insert,  subcutaneous with 2-0 and skin with subcuticular Prolene.  Wound was dressed sterilely.  Following the curing of the cement, the tourniquet was deflated at 75 minutes.  Minimal bleeding was noted.  This was cauterized with the Aquamantys.   Just prior to the final closure, we had injected Exparel 20 mL with saline into the periarticular tissues.  After the dressing was applied, the patient was then transported to the recovery room in satisfactory condition.  The patient tolerated the procedure well.  No complications.  Minimal blood loss, 50 mL.   PUS D: 07/14/2020 10:08:30 am T: 07/14/2020 11:18:00 am  JOB: [id number]/ [id number]

## 2020-07-14 NOTE — Anesthesia Postprocedure Evaluation (Signed)
Anesthesia Post Note  Patient: WEYLYN RICCIUTI  Procedure(s) Performed: TOTAL KNEE ARTHROPLASTY (Left Knee)     Patient location during evaluation: PACU Anesthesia Type: Spinal and Regional Level of consciousness: oriented and awake and alert Pain management: pain level controlled Vital Signs Assessment: post-procedure vital signs reviewed and stable Respiratory status: spontaneous breathing, respiratory function stable and patient connected to nasal cannula oxygen Cardiovascular status: blood pressure returned to baseline and stable Postop Assessment: no headache, no backache, no apparent nausea or vomiting, spinal receding and patient able to bend at knees Anesthetic complications: no   No complications documented.  Last Vitals:  Vitals:   07/14/20 1100 07/14/20 1132  BP: (!) 142/88 (!) 141/83  Pulse: 78 78  Resp: 13   Temp:  36.4 C  SpO2: 95% 99%    Last Pain:  Vitals:   07/14/20 1100  TempSrc:   PainSc: 0-No pain                 Karla Pavone,W. EDMOND

## 2020-07-14 NOTE — Anesthesia Procedure Notes (Signed)
Procedure Name: MAC Date/Time: 07/14/2020 7:26 AM Performed by: Niel Hummer, CRNA Pre-anesthesia Checklist: Patient identified, Suction available, Emergency Drugs available and Patient being monitored Oxygen Delivery Method: Simple face mask

## 2020-07-14 NOTE — Anesthesia Procedure Notes (Signed)
Anesthesia Regional Block: Adductor canal block   Pre-Anesthetic Checklist: ,, timeout performed, Correct Patient, Correct Site, Correct Laterality, Correct Procedure, Correct Position, site marked, Risks and benefits discussed, pre-op evaluation,  At surgeon's request and post-op pain management  Laterality: Left  Prep: Maximum Sterile Barrier Precautions used, chloraprep       Needles:  Injection technique: Single-shot  Needle Type: Echogenic Stimulator Needle     Needle Length: 9cm  Needle Gauge: 21     Additional Needles:   Procedures:,,,, ultrasound used (permanent image in chart),,,,  Narrative:  Start time: 07/14/2020 7:05 AM End time: 07/14/2020 7:15 AM Injection made incrementally with aspirations every 5 mL.  Performed by: Personally  Anesthesiologist: Roderic Palau, MD  Additional Notes: 2% Lidocaine skin wheel.

## 2020-07-14 NOTE — H&P (Signed)
Joshua Bowers is an 70 y.o. male.   Chief Complaint: Left knee pain HPI: Patient is a 70 year old male who presents for a left total knee arthroplasty due to end-stage osteoarthrosis protect the medial compartment of the left knee.  Refractory to conservative treatment.  Past Medical History:  Diagnosis Date  . Arthritis    hands  . Bladder outlet obstruction   . BPH (benign prostatic hyperplasia)   . Elevated PSA    per pt 2013  prostate bx negative for cancer  . Exposure to asbestos    per pt followed by a law firm in [place],  told last CT 2018 okay  . GERD (gastroesophageal reflux disease)   . History of bladder cancer    per pt 2011-- s/p  TURBT superficial per pt  . Hypertension   . Incomplete right bundle branch block   . Lower urinary tract symptoms (LUTS)     Past Surgical History:  Procedure Laterality Date  . APPENDECTOMY  age 37s  . INGUINAL HERNIA REPAIR Right 12/27/2015   Procedure: HERNIA REPAIR RIGHT INGUINAL ADULT WITH MESH;  Surgeon: [name], MD;  Location: [facility];  Service: General;  Laterality: Right;  . INSERTION OF MESH Right 12/27/2015   Procedure: INSERTION OF MESH;  Surgeon: [name], MD;  Location: [facility];  Service: General;  Laterality: Right;  . TONSILLECTOMY  age 3  . TRANSURETHRAL RESECTION OF PROSTATE N/A 11/20/2017   Procedure: TRANSURETHRAL RESECTION OF THE PROSTATE (TURP);  Surgeon: [name], MD;  Location: [facility];  Service: Urology;  Laterality: N/A;  . ULNAR TUNNEL RELEASE Bilateral 2012;  2009  . WRIST SURGERY Left 2013   no hardware    Family History  Problem Relation Age of Onset  . Colon cancer Neg Hx    Social History:  reports that he has never smoked. He has never used smokeless tobacco. He reports previous alcohol use. He reports that he does not use drugs.  Allergies: No Known Allergies  Medications Prior to Admission  Medication Sig Dispense Refill   . amLODipine (NORVASC) 5 MG tablet Take 5 mg by mouth every morning.     Marland Kitchen aspirin 325 MG EC tablet Take 325 mg by mouth daily.    Marland Kitchen b complex vitamins capsule Take 1 capsule by mouth daily.    Javier Docker Oil 350 MG CAPS Take 350 mg by mouth daily.    Marland Kitchen losartan (COZAAR) 100 MG tablet Take 100 mg by mouth every morning.    . Multiple Vitamin (MULTIVITAMIN WITH MINERALS) TABS Take 1 tablet by mouth daily.    . vitamin C (ASCORBIC ACID) 500 MG tablet Take 500 mg by mouth daily.    Marland Kitchen zinc gluconate 50 MG tablet Take 50 mg by mouth daily.      No results found for this or any previous visit (from the past 48 hour(s)). No results found.  Review of Systems  Musculoskeletal: Positive for joint swelling.       Left knee has a varus deformity.  His ranges -5-1 20.  Severe tenderness in the medial joint line.  Patellofemoral pain with compression.  Ipsilateral hip and ankle exam is unremarkable.  All other systems reviewed and are negative.   Blood pressure (!) 169/100, pulse 86, temperature 98.1 F (36.7 C), temperature source Oral, resp. rate 16, height 5' 11.5" (1.816 m), weight 85.3 kg, SpO2 97 %. Physical Exam Vitals and nursing note reviewed.  Constitutional:      Appearance: He is normal weight.  HENT:     Head: Normocephalic.  Eyes:     Pupils: Pupils are equal, round, and reactive to light.  Cardiovascular:     Rate and Rhythm: Normal rate.     Pulses: Normal pulses.  Pulmonary:     Effort: Pulmonary effort is normal.  Abdominal:     General: Abdomen is flat.  Musculoskeletal:        General: Swelling, tenderness and deformity present.     Cervical back: Normal range of motion.     Comments: Ranges -5-1 20.  Tender medial joint line.  Valgus deformity.  Ipsilateral hip and ankle exam is unremarkable.  No DVT.  Neurovascular intact  Skin:    General: Skin is warm and dry.  Neurological:     General: No focal deficit present.     Mental Status: He is alert.  Psychiatric:         Mood and Affect: Mood normal.   X-rays of the left knee demonstrates bone-on-bone arthrosis medial compartment of the left knee.  Varus deformity.  Patellofemoral arthrosis  Assessment/Plan End-stage osteoarthritis left knee varus deformity refractory to conservative treatment  Plan:  Discussed proceeding with left total knee arthroplasty.  Risk and benefit discussed including bleeding infection damage to neurovascular ruptures.  No change in symptoms worsening symptoms DVT PE is any complications etc.  Also possible loosening in the future.  All questions were answered.  Patient wished to proceed.  [name], MD 07/14/2020, 7:18 AM

## 2020-07-14 NOTE — Brief Op Note (Signed)
07/14/2020  9:58 AM  PATIENT:  Rona Ravens  70 y.o. male  PRE-OPERATIVE DIAGNOSIS:  Degenerative joint disease left knee  POST-OPERATIVE DIAGNOSIS:  * No post-op diagnosis entered *  PROCEDURE:  Procedure(s) with comments: TOTAL KNEE ARTHROPLASTY (Left) - 2.5 hrs  SURGEON:  Surgeon(s) and Role:    [name], MD - Primary  PHYSICIAN ASSISTANT:   ASSISTANTS: Bisssell   ANESTHESIA:   spinal  EBL:  50 mL   BLOOD ADMINISTERED:none  DRAINS: none   LOCAL MEDICATIONS USED:  MARCAINE     SPECIMEN:  No Specimen  DISPOSITION OF SPECIMEN:  N/A  COUNTS:  YES  TOURNIQUET:   Total Tourniquet Time Documented: Thigh (Left) - 78 minutes Total: Thigh (Left) - 78 minutes   DICTATION: .Other Dictation: Dictation Number   [id number]  PLAN OF CARE: Admit for overnight observation  PATIENT DISPOSITION:  PACU - hemodynamically stable.   Delay start of Pharmacological VTE agent (>24hrs) due to surgical blood loss or risk of bleeding: no

## 2020-07-14 NOTE — Anesthesia Procedure Notes (Signed)
Spinal  Patient location during procedure: OR Start time: 07/14/2020 7:29 AM End time: 07/14/2020 7:35 AM Staffing Performed: resident/CRNA  Resident/CRNA: Niel Hummer, CRNA Preanesthetic Checklist Completed: patient identified, IV checked, site marked, surgical consent, monitors and equipment checked and pre-op evaluation Spinal Block Patient position: sitting Prep: DuraPrep Patient monitoring: continuous pulse ox, blood pressure and heart rate Approach: midline Location: L3-4 Injection technique: single-shot Needle Needle type: Pencan  Needle gauge: 24 G Needle length: 10 cm Additional Notes Pt tolerated well. No complications noted.

## 2020-07-14 NOTE — Interval H&P Note (Signed)
History and Physical Interval Note:  07/14/2020 7:24 AM  Joshua Bowers  has presented today for surgery, with the diagnosis of Degenerative joint disease left knee.  The various methods of treatment have been discussed with the patient and family. After consideration of risks, benefits and other options for treatment, the patient has consented to  Procedure(s) with comments: TOTAL KNEE ARTHROPLASTY (Left) - 2.5 hrs as a surgical intervention.  The patient's history has been reviewed, patient examined, no change in status, stable for surgery.  I have reviewed the patient's chart and labs.  Questions were answered to the patient's satisfaction.     [name]

## 2020-07-14 NOTE — Transfer of Care (Signed)
Immediate Anesthesia Transfer of Care Note  Patient: Joshua Bowers  Procedure(s) Performed: TOTAL KNEE ARTHROPLASTY (Left Knee)  Patient Location: PACU  Anesthesia Type:Spinal  Level of Consciousness: awake, alert  and oriented  Airway & Oxygen Therapy: Patient Spontanous Breathing and Patient connected to face mask oxygen  Post-op Assessment: Report given to RN and Post -op Vital signs reviewed and stable  Post vital signs: Reviewed and stable  Last Vitals:  Vitals Value Taken Time  BP 128/78 07/14/20 1018  Temp    Pulse 79 07/14/20 1019  Resp 15 07/14/20 1019  SpO2 97 % 07/14/20 1019  Vitals shown include unvalidated device data.  Last Pain:  Vitals:   07/14/20 0628  TempSrc:   PainSc: 0-No pain      Patients Stated Pain Goal: 3 (48/30/73 5430)  Complications: No complications documented.

## 2020-07-14 NOTE — Discharge Instructions (Signed)
Elevate leg above heart 6x a day for 20minutes each Use knee immobilizer while walking until can SLR x 10 Use knee immobilizer in bed to keep knee in extension Aquacel dressing may remain in place until follow up. May shower with aquacel dressing in place. If the dressing becomes saturated or peels off, you may remove aquacel dressing. Do not remove steri-strips if they are present. Place new dressing with gauze and tape or ACE bandage which should be kept clean and dry and changed daily.  INSTRUCTIONS AFTER JOINT REPLACEMENT   o Remove items at home which could result in a fall. This includes throw rugs or furniture in walking pathways o ICE to the affected joint every three hours while awake for 30 minutes at a time, for at least the first 3-5 days, and then as needed for pain and swelling.  Continue to use ice for pain and swelling. You may notice swelling that will progress down to the foot and ankle.  This is normal after surgery.  Elevate your leg when you are not up walking on it.   o Continue to use the breathing machine you got in the hospital (incentive spirometer) which will help keep your temperature down.  It is common for your temperature to cycle up and down following surgery, especially at night when you are not up moving around and exerting yourself.  The breathing machine keeps your lungs expanded and your temperature down.   DIET:  As you were doing prior to hospitalization, we recommend a well-balanced diet.  DRESSING / WOUND CARE / SHOWERING  Keep the surgical dressing until follow up.  The dressing is water proof, so you can shower without any extra covering.  IF THE DRESSING FALLS OFF or the wound gets wet inside, change the dressing with sterile gauze.  Please use good hand washing techniques before changing the dressing.  Do not use any lotions or creams on the incision until instructed by your surgeon.    ACTIVITY  o Increase activity slowly as tolerated, but follow the  weight bearing instructions below.   o No driving for 6 weeks or until further direction given by your physician.  You cannot drive while taking narcotics.  o No lifting or carrying greater than 10 lbs. until further directed by your surgeon. o Avoid periods of inactivity such as sitting longer than an hour when not asleep. This helps prevent blood clots.  o You may return to work once you are authorized by your doctor.     WEIGHT BEARING   Weight bearing as tolerated with assist device (walker, cane, etc) as directed, use it as long as suggested by your surgeon or therapist, typically at least 4-6 weeks.   EXERCISES  Results after joint replacement surgery are often greatly improved when you follow the exercise, range of motion and muscle strengthening exercises prescribed by your doctor. Safety measures are also important to protect the joint from further injury. Any time any of these exercises cause you to have increased pain or swelling, decrease what you are doing until you are comfortable again and then slowly increase them. If you have problems or questions, call your caregiver or physical therapist for advice.   Rehabilitation is important following a joint replacement. After just a few days of immobilization, the muscles of the leg can become weakened and shrink (atrophy).  These exercises are designed to build up the tone and strength of the thigh and leg muscles and to improve motion. Often   times heat used for twenty to thirty minutes before working out will loosen up your tissues and help with improving the range of motion but do not use heat for the first two weeks following surgery (sometimes heat can increase post-operative swelling).   These exercises can be done on a training (exercise) mat, on the floor, on a table or on a bed. Use whatever works the best and is most comfortable for you.    Use music or television while you are exercising so that the exercises are a pleasant  break in your day. This will make your life better with the exercises acting as a break in your routine that you can look forward to.   Perform all exercises about fifteen times, three times per day or as directed.  You should exercise both the operative leg and the other leg as well.  Exercises include:   . Quad Sets - Tighten up the muscle on the front of the thigh (Quad) and hold for 5-10 seconds.   . Straight Leg Raises - With your knee straight (if you were given a brace, keep it on), lift the leg to 60 degrees, hold for 3 seconds, and slowly lower the leg.  Perform this exercise against resistance later as your leg gets stronger.  . Leg Slides: Lying on your back, slowly slide your foot toward your buttocks, bending your knee up off the floor (only go as far as is comfortable). Then slowly slide your foot back down until your leg is flat on the floor again.  . Angel Wings: Lying on your back spread your legs to the side as far apart as you can without causing discomfort.  . Hamstring Strength:  Lying on your back, push your heel against the floor with your leg straight by tightening up the muscles of your buttocks.  Repeat, but this time bend your knee to a comfortable angle, and push your heel against the floor.  You may put a pillow under the heel to make it more comfortable if necessary.   A rehabilitation program following joint replacement surgery can speed recovery and prevent re-injury in the future due to weakened muscles. Contact your doctor or a physical therapist for more information on knee rehabilitation.    CONSTIPATION  Constipation is defined medically as fewer than three stools per week and severe constipation as less than one stool per week.  Even if you have a regular bowel pattern at home, your normal regimen is likely to be disrupted due to multiple reasons following surgery.  Combination of anesthesia, postoperative narcotics, change in appetite and fluid intake all can  affect your bowels.   YOU MUST use at least one of the following options; they are listed in order of increasing strength to get the job done.  They are all available over the counter, and you may need to use some, POSSIBLY even all of these options:    Drink plenty of fluids (prune juice may be helpful) and high fiber foods Colace 100 mg by mouth twice a day  Senokot for constipation as directed and as needed Dulcolax (bisacodyl), take with full glass of water  Miralax (polyethylene glycol) once or twice a day as needed.  If you have tried all these things and are unable to have a bowel movement in the first 3-4 days after surgery call either your surgeon or your primary doctor.    If you experience loose stools or diarrhea, hold the medications until you stool   forms back up.  If your symptoms do not get better within 1 week or if they get worse, check with your doctor.  If you experience "the worst abdominal pain ever" or develop nausea or vomiting, please contact the office immediately for further recommendations for treatment.   ITCHING:  If you experience itching with your medications, try taking only a single pain pill, or even half a pain pill at a time.  You can also use Benadryl over the counter for itching or also to help with sleep.   TED HOSE STOCKINGS:  Use stockings on both legs until for at least 2 weeks or as directed by physician office. They may be removed at night for sleeping.  MEDICATIONS:  See your medication summary on the "After Visit Summary" that nursing will review with you.  You may have some home medications which will be placed on hold until you complete the course of blood thinner medication.  It is important for you to complete the blood thinner medication as prescribed.  PRECAUTIONS:  If you experience chest pain or shortness of breath - call 911 immediately for transfer to the hospital emergency department.   If you develop a fever greater that 101 F, purulent  drainage from wound, increased redness or drainage from wound, foul odor from the wound/dressing, or calf pain - CONTACT YOUR SURGEON.                                                   FOLLOW-UP APPOINTMENTS:  If you do not already have a post-op appointment, please call the office for an appointment to be seen by your surgeon.  Guidelines for how soon to be seen are listed in your "After Visit Summary", but are typically between 1-4 weeks after surgery.  OTHER INSTRUCTIONS:   Knee Replacement:  Do not place pillow under knee, focus on keeping the knee straight while resting. CPM instructions: 0-90 degrees, 2 hours in the morning, 2 hours in the afternoon, and 2 hours in the evening. Place foam block, curve side up under heel at all times except when in CPM or when walking.  DO NOT modify, tear, cut, or change the foam block in any way.   DENTAL ANTIBIOTICS:  In most cases prophylactic antibiotics for Dental procdeures after total joint surgery are not necessary.  Exceptions are as follows:  1. History of prior total joint infection  2. Severely immunocompromised (Organ Transplant, cancer chemotherapy, Rheumatoid biologic meds such as Humera)  3. Poorly controlled diabetes (A1C &gt; 8.0, blood glucose over 200)  If you have one of these conditions, contact your surgeon for an antibiotic prescription, prior to your dental procedure.   MAKE SURE YOU:  . Understand these instructions.  . Get help right away if you are not doing well or get worse.    Thank you for letting us be a part of your medical care team.  It is a privilege we respect greatly.  We hope these instructions will help you stay on track for a fast and full recovery!   

## 2020-07-14 NOTE — Evaluation (Signed)
Physical Therapy Evaluation Patient Details Name: Joshua Bowers MRN: [id number] DOB: [birth year] Today's Date: 07/14/2020   History of Present Illness  Pt is 70 yo male s/p L TKA on 07/14/20.  Clinical Impression  Pt is s/p TKA resulting in the deficits listed below (see PT Problem List). Pt making good progress for POD #0.  He was able to ambulate 30' with RW and min guard.  Pt had c/o pain but was tolerable.  Pt normally very independent and has good support at home.  Pt motivated and expected to progress well.  Pt will benefit from skilled PT to increase their independence and safety with mobility to allow discharge to the venue listed below.      Follow Up Recommendations Follow surgeon's recommendation for DC plan and follow-up therapies;Supervision for mobility/OOB    Equipment Recommendations  3in1 (PT)    Recommendations for Other Services       Precautions / Restrictions Precautions Precautions: Fall Restrictions Weight Bearing Restrictions: Yes LLE Weight Bearing: Weight bearing as tolerated      Mobility  Bed Mobility Overal bed mobility: Needs Assistance Bed Mobility: Supine to Sit     Supine to sit: Supervision;HOB elevated          Transfers Overall transfer level: Needs assistance Equipment used: Rolling walker (2 wheeled) Transfers: Sit to/from Stand Sit to Stand: Min assist;From elevated surface         General transfer comment: Cues for hand placement with min A to rise; cues for L LE management with sit to/from stand for pain control  Ambulation/Gait Ambulation/Gait assistance: Min guard Gait Distance (Feet): 30 Feet Assistive device: Rolling walker (2 wheeled) Gait Pattern/deviations: Step-to pattern;Decreased stride length;Decreased weight shift to left Gait velocity: decreased   General Gait Details: Small steps with cues for sequencing, WBAT, and RW proximity.  Stairs            Wheelchair Mobility    Modified Rankin (Stroke  Patients Only)       Balance Overall balance assessment: Needs assistance Sitting-balance support: No upper extremity supported Sitting balance-Leahy Scale: Good     Standing balance support: Bilateral upper extremity supported Standing balance-Leahy Scale: Poor Standing balance comment: Required RW                             Pertinent Vitals/Pain Pain Assessment: 0-10 Pain Score: 6  Pain Location: L knee Pain Descriptors / Indicators: Discomfort;Sore;Throbbing Pain Intervention(s): Limited activity within patient's tolerance;Monitored during session;Premedicated before session;Ice applied    Home Living Family/patient expects to be discharged to:: Private residence Living Arrangements: Spouse/significant other Available Help at Discharge: Family;Available 24 hours/day Type of Home: House Home Access: Other (comment) (threshold)     Home Layout: One level Home Equipment: Shower seat;Grab bars - tub/shower;Walker - 2 wheels;Wheelchair - manual;Crutches      Prior Function Level of Independence: Independent         Comments: Pt very independent - still works at [employer].  Likes to ride his stationary bike - rode 10 miles yesterday     Hand Dominance        Extremity/Trunk Assessment   Upper Extremity Assessment Upper Extremity Assessment: Overall WFL for tasks assessed    Lower Extremity Assessment Lower Extremity Assessment: RLE deficits/detail;LLE deficits/detail RLE Deficits / Details: WFL LLE Deficits / Details: ROM: hip and ankle - WNL, knee lacks 5 ext - 75 flexion;  MMT : ankle 5/5,  knee and hip 3/5 not further tested due to acuity of sx    Cervical / Trunk Assessment Cervical / Trunk Assessment: Normal  Communication   Communication: No difficulties  Cognition Arousal/Alertness: Awake/alert Behavior During Therapy: WFL for tasks assessed/performed Overall Cognitive Status: Within Functional Limits for tasks assessed                                         General Comments General comments (skin integrity, edema, etc.): Pt was on 2 L O2 at arrival with sats 98%. Tried RA and sats maintained >95%.  Left on RA    Exercises Total Joint Exercises Ankle Circles/Pumps: AROM;10 reps;Both;Seated Quad Sets: AROM;Both;5 reps;Seated   Assessment/Plan    PT Assessment Patient needs continued PT services  PT Problem List Decreased strength;Decreased mobility;Decreased range of motion;Decreased activity tolerance;Decreased knowledge of precautions;Decreased balance;Decreased knowledge of use of DME;Pain       PT Treatment Interventions DME instruction;Therapeutic activities;Modalities;Gait training;Therapeutic exercise;Patient/family education;Stair training;Balance training;Functional mobility training    PT Goals (Current goals can be found in the Care Plan section)  Acute Rehab PT Goals Patient Stated Goal: return home PT Goal Formulation: With patient Time For Goal Achievement: 07/28/20 Potential to Achieve Goals: Good    Frequency 7X/week   Barriers to discharge        Co-evaluation               AM-PAC PT "6 Clicks" Mobility  Outcome Measure Help needed turning from your back to your side while in a flat bed without using bedrails?: None Help needed moving from lying on your back to sitting on the side of a flat bed without using bedrails?: A Little Help needed moving to and from a bed to a chair (including a wheelchair)?: A Little Help needed standing up from a chair using your arms (e.g., wheelchair or bedside chair)?: A Little Help needed to walk in hospital room?: A Little Help needed climbing 3-5 steps with a railing? : A Little 6 Click Score: 19    End of Session Equipment Utilized During Treatment: Gait belt Activity Tolerance: Patient tolerated treatment well Patient left: in chair;with chair alarm set;with call bell/phone within reach Nurse Communication: Mobility  status PT Visit Diagnosis: Other abnormalities of gait and mobility (R26.89);Muscle weakness (generalized) (M62.81)    Time: 9150-5697 PT Time Calculation (min) (ACUTE ONLY): 34 min   Charges:   PT Evaluation $PT Eval Moderate Complexity: 1 Mod PT Treatments $Gait Training: 8-22 mins        Abran Richard, PT Acute Rehab Services Pager [phone] [facility] [place] 07/14/2020, 2:40 PM

## 2020-07-15 ENCOUNTER — Other Ambulatory Visit: Payer: Self-pay

## 2020-07-15 DIAGNOSIS — M79605 Pain in left leg: Secondary | ICD-10-CM | POA: Diagnosis not present

## 2020-07-15 DIAGNOSIS — Z8551 Personal history of malignant neoplasm of bladder: Secondary | ICD-10-CM | POA: Diagnosis not present

## 2020-07-15 DIAGNOSIS — I10 Essential (primary) hypertension: Secondary | ICD-10-CM | POA: Diagnosis present

## 2020-07-15 DIAGNOSIS — M21162 Varus deformity, not elsewhere classified, left knee: Secondary | ICD-10-CM | POA: Diagnosis present

## 2020-07-15 DIAGNOSIS — Z7982 Long term (current) use of aspirin: Secondary | ICD-10-CM | POA: Diagnosis not present

## 2020-07-15 DIAGNOSIS — K219 Gastro-esophageal reflux disease without esophagitis: Secondary | ICD-10-CM | POA: Diagnosis present

## 2020-07-15 DIAGNOSIS — R0989 Other specified symptoms and signs involving the circulatory and respiratory systems: Secondary | ICD-10-CM | POA: Diagnosis not present

## 2020-07-15 DIAGNOSIS — Z79899 Other long term (current) drug therapy: Secondary | ICD-10-CM | POA: Diagnosis not present

## 2020-07-15 DIAGNOSIS — M1712 Unilateral primary osteoarthritis, left knee: Secondary | ICD-10-CM | POA: Diagnosis present

## 2020-07-15 DIAGNOSIS — Z96659 Presence of unspecified artificial knee joint: Secondary | ICD-10-CM

## 2020-07-15 LAB — CBC
HCT: 39.6 % (ref 39.0–52.0)
Hemoglobin: 13.4 g/dL (ref 13.0–17.0)
MCH: 30.8 pg (ref 26.0–34.0)
MCHC: 33.8 g/dL (ref 30.0–36.0)
MCV: 91 fL (ref 80.0–100.0)
Platelets: 277 10*3/uL (ref 150–400)
RBC: 4.35 MIL/uL (ref 4.22–5.81)
RDW: 12.8 % (ref 11.5–15.5)
WBC: 17.3 10*3/uL — ABNORMAL HIGH (ref 4.0–10.5)
nRBC: 0 % (ref 0.0–0.2)

## 2020-07-15 LAB — BASIC METABOLIC PANEL
Anion gap: 10 (ref 5–15)
BUN: 18 mg/dL (ref 8–23)
CO2: 24 mmol/L (ref 22–32)
Calcium: 8.6 mg/dL — ABNORMAL LOW (ref 8.9–10.3)
Chloride: 104 mmol/L (ref 98–111)
Creatinine, Ser: 1.17 mg/dL (ref 0.61–1.24)
GFR, Estimated: >60 mL/min (ref >60)
Glucose, Bld: 128 mg/dL — ABNORMAL HIGH (ref 70–99)
Potassium: 3.8 mmol/L (ref 3.5–5.1)
Sodium: 138 mmol/L (ref 135–145)

## 2020-07-15 MED ORDER — LORAZEPAM 0.5 MG PO TABS
0.5000 mg | ORAL_TABLET | ORAL | Status: DC | PRN
  Administered 2020-07-15 (×2): 1 mg via ORAL
  Filled 2020-07-15 (×2): qty 2

## 2020-07-15 NOTE — Progress Notes (Signed)
Patient ID: Joshua Bowers, male   DOB: 23-May-1950, 70 y.o.   MRN: [id number]  Subjective: 1 Day Post-Op Procedure(s) (LRB): TOTAL KNEE ARTHROPLASTY (Left) Patient reports pain as moderate and severe.    Patient has complaints of L knee pain and swelling  We will start therapy today. Plan is to go home after hospital stay.  Objective: Vital signs in last 24 hours: Temp:  [97.5 F (36.4 C)-98 F (36.7 C)] 97.8 F (36.6 C) (02/24 0943) Pulse Rate:  [69-91] 79 (02/24 0943) Resp:  [12-20] 20 (02/24 0943) BP: (138-158)/(83-99) 150/85 (02/24 0943) SpO2:  [95 %-100 %] 97 % (02/24 0943)  Intake/Output from previous day:  Intake/Output Summary (Last 24 hours) at 07/15/2020 1039 Last data filed at 07/15/2020 0900 Gross per 24 hour  Intake 480 ml  Output 1860 ml  Net -1380 ml    Intake/Output this shift: Total I/O In: -  Out: 200 [Urine:200]  Labs: Results for orders placed or performed during the hospital encounter of 07/14/20  CBC  Result Value Ref Range   WBC 17.3 (H) 4.0 - 10.5 K/uL   RBC 4.35 4.22 - 5.81 MIL/uL   Hemoglobin 13.4 13.0 - 17.0 g/dL   HCT 39.6 39.0 - 52.0 %   MCV 91.0 80.0 - 100.0 fL   MCH 30.8 26.0 - 34.0 pg   MCHC 33.8 30.0 - 36.0 g/dL   RDW 12.8 11.5 - 15.5 %   Platelets 277 150 - 400 K/uL   nRBC 0.0 0.0 - 0.2 %  Basic metabolic panel  Result Value Ref Range   Sodium 138 135 - 145 mmol/L   Potassium 3.8 3.5 - 5.1 mmol/L   Chloride 104 98 - 111 mmol/L   CO2 24 22 - 32 mmol/L   Glucose, Bld 128 (H) 70 - 99 mg/dL   BUN 18 8 - 23 mg/dL   Creatinine, Ser 1.17 0.61 - 1.24 mg/dL   Calcium 8.6 (L) 8.9 - 10.3 mg/dL   GFR, Estimated >60 >60 mL/min   Anion gap 10 5 - 15    Exam - Neurologically intact ABD soft Neurovascular intact Sensation intact distally Intact pulses distally Dorsiflexion/Plantar flexion intact Incision: dressing C/D/I No cellulitis present Compartment soft Moderate swelling L knee and into lower leg. No calf pain. Negative  homans Dressing - clean, dry, no drainage Motor function intact - moving foot and toes well on exam.   Assessment/Plan: 1 Day Post-Op Procedure(s) (LRB): TOTAL KNEE ARTHROPLASTY (Left)  Advance diet Up with therapy D/C IV fluids Past Medical History:  Diagnosis Date  . Arthritis    hands  . Bladder outlet obstruction   . BPH (benign prostatic hyperplasia)   . Elevated PSA    per pt 2013  prostate bx negative for cancer  . Exposure to asbestos    per pt followed by a law firm in [place],  told last CT 2018 okay  . GERD (gastroesophageal reflux disease)   . History of bladder cancer    per pt 2011-- s/p  TURBT superficial per pt  . Hypertension   . Incomplete right bundle branch block   . Lower urinary tract symptoms (LUTS)     DVT Prophylaxis - ASA Protocol Weight-Bearing as tolerated to Left leg No vaccines. Elevate L leg above heart level  [name] 07/15/2020, 10:39 AM

## 2020-07-15 NOTE — Progress Notes (Signed)
PT Cancellation Note  Patient Details Name: Joshua Bowers MRN: [id number] DOB: Jul 04, 1950   Cancelled Treatment:    Reason Eval/Treat Not Completed: Medical issues which prohibited therapy, increased edema and pain of the left knee/leg. Will follow in AM.   [name] 07/15/2020, 1:51 PM [place] Pager [phone] Office [phone]

## 2020-07-15 NOTE — TOC Transition Note (Signed)
Transition of Care [facility]) - CM/SW Discharge Note   Patient Details  Name: Joshua Bowers MRN: [id number] Date of Birth: [birth year]  Transition of Care [facility]) CM/SW Contact:  Lia Hopping, [place] Hope King Phone Number: 07/15/2020, 10:04 AM   Clinical Narrative:    Prearranged Therapy Plan: Per patient, he will go to [place]  Patient confirm he has a RW. 3 in1 ordered and delivered to the patient bedside by [employer]   Final next level of care: OP Rehab Barriers to Discharge: Barriers Resolved   Patient Goals and CMS Choice        Discharge Placement                       Discharge Plan and Services                DME Arranged: 3-N-1 DME Agency: [employer] Date DME Agency Contacted: 07/15/20 Time DME Agency Contacted: 1004 Representative spoke with at DME Agency: [place] ([employer]) Interventions     Readmission Risk Interventions No flowsheet data found.

## 2020-07-15 NOTE — Progress Notes (Signed)
Physical Therapy Treatment Patient Details Name: Joshua Bowers MRN: [id number] DOB: 03-14-1951 Today's Date: 07/15/2020    History of Present Illness Pt is 70 yo male s/p L TKA on 07/14/20.    PT Comments    Patient  Complains of pain/dizcomfort on dorsum left foot. Loosened Ace wrap which seemed to relieve some discomfort.  Patient also reports tightness and discomfort lateral upper lower leg. Noted area of edema. RN aware.  Patient reports pain 7/10 (with medication on board)during therapy. Continue PT for gait in PM.  Patient instructed in heel support and lateral wedge to facilitate stretch of hamstring.   Follow Up Recommendations  Follow surgeon's recommendation for DC plan and follow-up therapies;Supervision for mobility/OOB     Equipment Recommendations  3in1 (PT)    Recommendations for Other Services       Precautions / Restrictions Precautions Precautions: Fall Required Braces or Orthoses: Knee Immobilizer - Left Knee Immobilizer - Left:  (did not use KI) Restrictions Weight Bearing Restrictions: No LLE Weight Bearing: Weight bearing as tolerated    Mobility  Bed Mobility Overal bed mobility: Modified Independent             General bed mobility comments: cues to support left leg to floor    Transfers Overall transfer level: Needs assistance Equipment used: Rolling walker (2 wheeled) Transfers: Sit to/from Stand Sit to Stand: From elevated surface;Min guard         General transfer comment: Cues for hand and LLE position for sit/stand  Ambulation/Gait Ambulation/Gait assistance: Min guard Gait Distance (Feet): 100 Feet Assistive device: Rolling walker (2 wheeled) Gait Pattern/deviations: Step-to pattern;Decreased step length - left;Decreased stance time - left;Step-through pattern Gait velocity: decreased   General Gait Details: improved gait/ step length   Stairs             Wheelchair Mobility    Modified Rankin (Stroke  Patients Only)       Balance   Sitting-balance support: No upper extremity supported Sitting balance-Leahy Scale: Good     Standing balance support: Bilateral upper extremity supported Standing balance-Leahy Scale: Fair Standing balance comment: Required RW                            Cognition Arousal/Alertness: Awake/alert                                            Exercises Total Joint Exercises Ankle Circles/Pumps: PROM Quad Sets: AROM;Both;Supine;10 reps Heel Slides: AAROM;Left;10 reps;Supine Straight Leg Raises: AAROM;Supine;Left;10 reps Goniometric ROM: 10-45 left knee    General Comments        Pertinent Vitals/Pain Pain Score: 7  Pain Location: L knee. dorsal left foot, proximal lateral lower leg Pain Descriptors / Indicators: Discomfort;Sore;Throbbing;Tightness Pain Intervention(s): Monitored during session;Premedicated before session;Repositioned;Limited activity within patient's tolerance;Ice applied    Home Living                      Prior Function            PT Goals (current goals can now be found in the care plan section) Progress towards PT goals: Progressing toward goals    Frequency    7X/week      PT Plan Current plan remains appropriate    Co-evaluation  AM-PAC PT "6 Clicks" Mobility   Outcome Measure  Help needed turning from your back to your side while in a flat bed without using bedrails?: None Help needed moving from lying on your back to sitting on the side of a flat bed without using bedrails?: None Help needed moving to and from a bed to a chair (including a wheelchair)?: A Little Help needed standing up from a chair using your arms (e.g., wheelchair or bedside chair)?: A Little Help needed to walk in hospital room?: A Little Help needed climbing 3-5 steps with a railing? : A Little 6 Click Score: 20    End of Session Equipment Utilized During Treatment: Gait  belt Activity Tolerance: Patient tolerated treatment well Patient left: in chair;with call bell/phone within reach Nurse Communication: Mobility status PT Visit Diagnosis: Other abnormalities of gait and mobility (R26.89);Muscle weakness (generalized) (M62.81)     Time: 7209-4709 PT Time Calculation (min) (ACUTE ONLY): 43 min  Charges:  $Gait Training: 8-22 mins $Therapeutic Exercise: 8-22 mins $Self Care/Home Management: [place] Pager [phone] Office [phone]    [name] 07/15/2020, 10:44 AM

## 2020-07-15 NOTE — Progress Notes (Signed)
Foley catheter removed per orders, pt tolerated well. Peri-care completed.

## 2020-07-16 ENCOUNTER — Inpatient Hospital Stay (HOSPITAL_COMMUNITY): Payer: Medicare Other

## 2020-07-16 ENCOUNTER — Encounter (HOSPITAL_COMMUNITY): Payer: Self-pay | Admitting: Specialist

## 2020-07-16 ENCOUNTER — Encounter (HOSPITAL_COMMUNITY): Payer: Medicare Other

## 2020-07-16 DIAGNOSIS — R0989 Other specified symptoms and signs involving the circulatory and respiratory systems: Secondary | ICD-10-CM

## 2020-07-16 DIAGNOSIS — M79605 Pain in left leg: Secondary | ICD-10-CM

## 2020-07-16 LAB — CBC
HCT: 39 % (ref 39.0–52.0)
Hemoglobin: 13 g/dL (ref 13.0–17.0)
MCH: 30.6 pg (ref 26.0–34.0)
MCHC: 33.3 g/dL (ref 30.0–36.0)
MCV: 91.8 fL (ref 80.0–100.0)
Platelets: 265 10*3/uL (ref 150–400)
RBC: 4.25 MIL/uL (ref 4.22–5.81)
RDW: 13.1 % (ref 11.5–15.5)
WBC: 11.7 10*3/uL — ABNORMAL HIGH (ref 4.0–10.5)
nRBC: 0 % (ref 0.0–0.2)

## 2020-07-16 MED ORDER — LORAZEPAM 0.5 MG PO TABS: 0.5000 mg | ORAL_TABLET | ORAL | 0 refills | Status: DC | PRN

## 2020-07-16 MED FILL — Sodium Chloride Irrigation Soln 0.9%: Qty: 2000 | Status: AC

## 2020-07-16 NOTE — Progress Notes (Signed)
Patient ID: Joshua Bowers, male   DOB: 07-Jan-1951, 70 y.o.   MRN: [id number]  Patient seen by Dr. Lyla Glassing who felt patient had asymmetric pulses.  Exam:   Discussed with vascular suggested duple arterial and venous. ABI's.   Compartments soft. 2+PT right 1+left. 1+ DP bilat. No pain with passive ROM.  Results:  Neg for DVT. Triphasic arterial forms DP and PT.  ABI pending.  Patient much better than yesterday. Less swelling..  Plan:  D/C tomorrow.

## 2020-07-16 NOTE — Progress Notes (Signed)
Left lower extremity venous duplex and ABI's have been completed. Preliminary results can be found in CV Proc through chart review.   07/16/20 4:52 PM Carlos Levering RVT

## 2020-07-16 NOTE — Progress Notes (Signed)
Physical Therapy Treatment Patient Details Name: Joshua Bowers MRN: [id number] DOB: 10-Dec-1950 Today's Date: 07/16/2020    History of Present Illness Pt is 70 yo male s/p L TKA on 07/14/20.Onset  of increased edema of the leg post op.    PT Comments    Patient reports swelling has decreased. Noted significant edema from thigh to foot. Patient placing at most PWB on the left foot, unable to place foot flat at stance and knee is flexed. Patient tolerated ambulating x 60', limited due to report of   increased  Pain. Placed  Elevated Left leg while in bed, ice applied.  Continue  Ambulation. Did not perform exercises due to edema and pain.     Follow Up Recommendations  Follow surgeon's recommendation for DC plan and follow-up therapies;Supervision for mobility/OOB     Equipment Recommendations  3in1 (PT)    Recommendations for Other Services       Precautions / Restrictions Precautions Precautions: Fall Precaution Comments: did not use KI Required Braces or Orthoses: Knee Immobilizer - Left    Mobility  Bed Mobility Overal bed mobility: Needs Assistance Bed Mobility: Supine to Sit;Sit to Supine     Supine to sit: Min assist Sit to supine: Min guard   General bed mobility comments: used belt around foot to move to sitting, required assistnace to lower  foot to floor, used belt to place leg onto bed    Transfers Overall transfer level: Needs assistance Equipment used: Rolling walker (2 wheeled) Transfers: Sit to/from Stand Sit to Stand: From elevated surface;Min assist         General transfer comment: Cues for hand and LLE position for sit/stand  Ambulation/Gait Ambulation/Gait assistance: Min assist Gait Distance (Feet): 60 Feet Assistive device: Rolling walker (2 wheeled) Gait Pattern/deviations: Step-to pattern;Antalgic;Decreased stance time - left;Decreased step length - left Gait velocity: decreased   General Gait Details: initially patient  was TDWB on  left leg, decreased  ability to place foot flat. Imptroved to  [facility], still unable to place foot flat.   Stairs             Wheelchair Mobility    Modified Rankin (Stroke Patients Only)       Balance           Standing balance support: No upper extremity supported Standing balance-Leahy Scale: Fair Standing balance comment: stood to urinate                            Cognition Arousal/Alertness: Awake/alert Behavior During Therapy: WFL for tasks assessed/performed Overall Cognitive Status: Within Functional Limits for tasks assessed                                        Exercises      General Comments        Pertinent Vitals/Pain Pain Score: 7  Pain Location: anterior knee" Across my knee cap" Pain Descriptors / Indicators: Discomfort;Jabbing;Aching Pain Intervention(s): Limited activity within patient's tolerance;Monitored during session;Premedicated before session;Ice applied;Repositioned    Home Living                      Prior Function            PT Goals (current goals can now be found in the care plan section) Progress towards PT goals: Progressing toward goals  Frequency    7X/week      PT Plan Current plan remains appropriate    Co-evaluation              AM-PAC PT "6 Clicks" Mobility   Outcome Measure  Help needed turning from your back to your side while in a flat bed without using bedrails?: A Little Help needed moving from lying on your back to sitting on the side of a flat bed without using bedrails?: A Little Help needed moving to and from a bed to a chair (including a wheelchair)?: A Little Help needed standing up from a chair using your arms (e.g., wheelchair or bedside chair)?: A Little Help needed to walk in hospital room?: A Little Help needed climbing 3-5 steps with a railing? : A Lot 6 Click Score: 17    End of Session Equipment Utilized During Treatment: Gait  belt Activity Tolerance: Patient limited by pain Patient left: in bed;with call bell/phone within reach;with bed alarm set Nurse Communication: Mobility status PT Visit Diagnosis: Other abnormalities of gait and mobility (R26.89);Muscle weakness (generalized) (M62.81);Pain Pain - Right/Left: Left Pain - part of body: Knee     Time: 1287-8676 PT Time Calculation (min) (ACUTE ONLY): 24 min  Charges:  $Gait Training: 23-37 mins                     [place] Pager [phone] Office [phone]   [name] 07/16/2020, 2:12 PM

## 2020-07-16 NOTE — Progress Notes (Signed)
PT Cancellation Note  Patient Details Name: Joshua Bowers MRN: [id number] DOB: 1950-08-28   Cancelled Treatment:    Reason Eval/Treat Not Completed: Medical issues which prohibited therapy, testing for LLE.   [name] 07/16/2020, 4:37 PM [place] Pager [phone] Office [phone]

## 2020-07-16 NOTE — Progress Notes (Signed)
Subjective: 2 Days Post-Op Procedure(s) (LRB): TOTAL KNEE ARTHROPLASTY (Left) Patient reports pain as moderate.  Swelling seems some better today. No new c/o.  Objective: Vital signs in last 24 hours: Temp:  [97.8 F (36.6 C)-99.2 F (37.3 C)] 99.2 F (37.3 C) (02/25 0529) Pulse Rate:  [79-100] 100 (02/25 0529) Resp:  [16-20] 18 (02/25 0529) BP: (150-160)/(85-97) 157/97 (02/25 0529) SpO2:  [93 %-97 %] 93 % (02/25 0529) Weight:  [82.1 kg] 82.1 kg (02/24 1500)  Intake/Output from previous day: 02/24 0701 - 02/25 0700 In: 1400 [P.O.:1200; I.V.:200] Out: 1200 [Urine:1200] Intake/Output this shift: No intake/output data recorded.  Recent Labs    07/15/20 0335 07/16/20 0327  HGB 13.4 13.0   Recent Labs    07/15/20 0335 07/16/20 0327  WBC 17.3* 11.7*  RBC 4.35 4.25  HCT 39.6 39.0  PLT 277 265   Recent Labs    07/15/20 0335  NA 138  K 3.8  CL 104  CO2 24  BUN 18  CREATININE 1.17  GLUCOSE 128*  CALCIUM 8.6*   No results for input(s): LABPT, INR in the last 72 hours.  Neurologically intact ABD soft Neurovascular intact Sensation intact distally Intact pulses distally Dorsiflexion/Plantar flexion intact Incision: dressing C/D/I and no drainage No cellulitis present Compartment soft no calf pain. swelling improving with elevation   Assessment/Plan: 2 Days Post-Op Procedure(s) (LRB): TOTAL KNEE ARTHROPLASTY (Left) Advance diet Up with therapy D/C IV fluids Anticipate D/C later today to home Outpt PT and follow up scheduled Elevate above heart level to continue to reduce swelling Discussed with Dr. Valentino Saxon Deatra Robinson 07/16/2020, 8:36 AM

## 2020-07-17 LAB — CBC
HCT: 36.3 % — ABNORMAL LOW (ref 39.0–52.0)
Hemoglobin: 12.3 g/dL — ABNORMAL LOW (ref 13.0–17.0)
MCH: 31.2 pg (ref 26.0–34.0)
MCHC: 33.9 g/dL (ref 30.0–36.0)
MCV: 92.1 fL (ref 80.0–100.0)
Platelets: 246 10*3/uL (ref 150–400)
RBC: 3.94 MIL/uL — ABNORMAL LOW (ref 4.22–5.81)
RDW: 12.9 % (ref 11.5–15.5)
WBC: 12.5 10*3/uL — ABNORMAL HIGH (ref 4.0–10.5)
nRBC: 0 % (ref 0.0–0.2)

## 2020-07-17 NOTE — Progress Notes (Signed)
Physical Therapy Treatment Patient Details Name: Joshua Bowers MRN: [id number] DOB: 03/02/1951 Today's Date: 07/17/2020    History of Present Illness Pt is 70 yo male s/p L TKA on 07/14/20.Onset  of increased edema of the leg post op.    PT Comments    Pt did well today with initiation of stair training and increasing WB during gait. He continues to have some difficulty with bed mobility, but uses gait belt and R LE to A L LE.     Follow Up Recommendations  Follow surgeon's recommendation for DC plan and follow-up therapies;Supervision for mobility/OOB     Equipment Recommendations  3in1 (PT);Rolling walker with 5" wheels    Recommendations for Other Services       Precautions / Restrictions Precautions Precautions: Fall Required Braces or Orthoses: Knee Immobilizer - Left Restrictions Weight Bearing Restrictions: No LLE Weight Bearing: Weight bearing as tolerated    Mobility  Bed Mobility Overal bed mobility: Needs Assistance Bed Mobility: Supine to Sit     Supine to sit: Min guard     General bed mobility comments: used belt around foot and R LE to assist L LE to get to EOB with cues for safe technique.    Transfers Overall transfer level: Needs assistance Equipment used: Rolling walker (2 wheeled)   Sit to Stand: Min assist;From elevated surface         General transfer comment: cues for hand placement  Ambulation/Gait Ambulation/Gait assistance: Min guard Gait Distance (Feet): 70 Feet Assistive device: Rolling walker (2 wheeled) Gait Pattern/deviations: Decreased stance time - left;Step-to pattern;Antalgic Gait velocity: decreased   General Gait Details: Initially was TDWB on L LE and then progressed to WBAT with KI intact   Stairs Stairs: Yes Stairs assistance: Min guard Stair Management: Backwards;Forwards Number of Stairs: 1 General stair comments: practiced stair training on 1 step going backwards for ascent and forward for descent with  good understanding and safety.   Wheelchair Mobility    Modified Rankin (Stroke Patients Only)       Balance   Sitting-balance support: No upper extremity supported Sitting balance-Leahy Scale: Fair       Standing balance-Leahy Scale: Fair Standing balance comment: RW for dynamic movement                            Cognition Arousal/Alertness: Awake/alert Behavior During Therapy: WFL for tasks assessed/performed Overall Cognitive Status: Within Functional Limits for tasks assessed                                        Exercises Total Joint Exercises Ankle Circles/Pumps: AROM;Both;10 reps Quad Sets: Both;5 reps;Supine Heel Slides: AROM;AAROM;Left;Supine    General Comments        Pertinent Vitals/Pain Pain Assessment: Faces Faces Pain Scale: Hurts little more Pain Descriptors / Indicators: Operative site guarding;Grimacing Pain Intervention(s): Limited activity within patient's tolerance;Monitored during session;Ice applied;Repositioned;Premedicated before session    Home Living                      Prior Function            PT Goals (current goals can now be found in the care plan section) Acute Rehab PT Goals Patient Stated Goal: return home Time For Goal Achievement: 07/28/20 Potential to Achieve Goals: Good Progress towards PT goals: Progressing  toward goals    Frequency    7X/week      PT Plan Current plan remains appropriate;Equipment recommendations need to be updated (Pt concerned about stability of the old walker he has, due it being his in-laws old walker. Unsure if it had wheels.)    Co-evaluation              AM-PAC PT "6 Clicks" Mobility   Outcome Measure  Help needed turning from your back to your side while in a flat bed without using bedrails?: A Little Help needed moving from lying on your back to sitting on the side of a flat bed without using bedrails?: A Little Help needed moving to  and from a bed to a chair (including a wheelchair)?: A Little Help needed standing up from a chair using your arms (e.g., wheelchair or bedside chair)?: A Little Help needed to walk in hospital room?: A Little Help needed climbing 3-5 steps with a railing? : A Little 6 Click Score: 18    End of Session Equipment Utilized During Treatment: Gait belt Activity Tolerance: Patient tolerated treatment well Patient left: in chair;with call bell/phone within reach Nurse Communication: Mobility status PT Visit Diagnosis: Other abnormalities of gait and mobility (R26.89);Muscle weakness (generalized) (M62.81);Pain Pain - Right/Left: Left Pain - part of body: Knee     Time: 6314-9702 PT Time Calculation (min) (ACUTE ONLY): 35 min  Charges:  $Gait Training: 8-22 mins $Therapeutic Exercise: 8-22 mins                     Karen L. Tamala Julian, [place] Pager [phone] 07/17/2020    Joshua Bowers 07/17/2020, 11:33 AM

## 2020-07-17 NOTE — TOC Progression Note (Signed)
Transition of Care [facility]) - Progression Note    Patient Details  Name: Joshua Bowers MRN: [id number] Date of Birth: 04/23/1951  Transition of Care [facility]) CM/SW Contact  Joaquin Courts, RN Phone Number: 07/17/2020, 11:18 AM  Clinical Narrative:    [facility] to provide HHPT.   Expected Discharge Plan: [place] Barriers to Discharge: Barriers Resolved  Expected Discharge Plan and Services Expected Discharge Plan: [place] Choice: Home Health   Expected Discharge Date: 07/17/20               DME Arranged: 3-N-1 DME Agency: [employer] Date DME Agency Contacted: 07/15/20 Time DME Agency Contacted: 1004 Representative spoke with at DME Agency: Ovid Curd HH Arranged: PT [place]: [facility] [employer] Date [place]: 07/17/20 Time [place]: 1118 Representative spoke with at [place]: [place] ([place]) Interventions    Readmission Risk Interventions No flowsheet data found.

## 2020-07-17 NOTE — TOC Progression Note (Signed)
Transition of Care [facility]) - Progression Note    Patient Details  Name: Joshua Bowers MRN: [id number] Date of Birth: [birth year]  Transition of Care [facility]) CM/SW Contact  Joaquin Courts, RN Phone Number: 07/17/2020, 11:46 AM  Clinical Narrative:    [employer] to deliver rolling walker to bedside.  Expected Discharge Plan: [place] Barriers to Discharge: Barriers Resolved  Expected Discharge Plan and Services Expected Discharge Plan: [place] Choice: Home Health   Expected Discharge Date: 07/17/20               DME Arranged: [place] Rile rolling DME Agency: [employer] Date DME Agency Contacted: 07/17/20 Time DME Agency Contacted: [id number] Representative spoke with at DME Agency: Ovid Curd [place]: PT [place]: [facility] [employer] Date [place]: 07/17/20 Time [place]: 1118 Representative spoke with at [place]: [place] ([place]) Interventions    Readmission Risk Interventions No flowsheet data found.

## 2020-07-17 NOTE — Progress Notes (Signed)
   Subjective:  Patient reports pain as moderate.  Reports swelling of the knee. Denies numbness or tingling. Denies fever or chills. Had some concerns with outpatient therapy after discharge, asking for home health PT referral.    PHYSICIAN: [name], MD  Operative Report   DATE OF PROCEDURE: 07/14/2020  PREOPERATIVE DIAGNOSIS:  End-stage osteoarthritis, varus deformity of the left knee.  POSTOPERATIVE DIAGNOSIS:  End-stage osteoarthritis, varus deformity of the left knee.  PROCEDURE PERFORMED:  Left total knee arthroplasty utilizing DePuy Attune rotating platform 7 femur, 7 tibia, 6 mm insert, 38 patella. Objective:   VITALS:   Vitals:   07/16/20 0529 07/16/20 1257 07/16/20 2155 07/17/20 0532  BP: (!) 157/97 (!) 163/87 (!) 153/85 (!) 153/91  Pulse: 100 93 96 90  Resp: 18 18 17 16   Temp: 99.2 F (37.3 C) 98.2 F (36.8 C) 98.4 F (36.9 C) 99 F (37.2 C)  TempSrc: Oral Oral    SpO2: 93% 93% 94% 94%  Weight:      Height:       Left Lower Extremity:  Neurologically intact Neurovascular intact Sensation intact distally Intact pulses distally Dorsiflexion/Plantar flexion intact Incision: dressing C/D/I Compartment soft Moderate swelling of the knee, no signs of infection.   Lab Results  Component Value Date   WBC 12.5 (H) 07/17/2020   HGB 12.3 (L) 07/17/2020   HCT 36.3 (L) 07/17/2020   MCV 92.1 07/17/2020   PLT 246 07/17/2020   BMET    Component Value Date/Time   NA 138 07/15/2020 0335   K 3.8 07/15/2020 0335   CL 104 07/15/2020 0335   CO2 24 07/15/2020 0335   GLUCOSE 128 (H) 07/15/2020 0335   BUN 18 07/15/2020 0335   CREATININE 1.17 07/15/2020 0335   CALCIUM 8.6 (L) 07/15/2020 0335   GFRNONAA >60 07/15/2020 0335     Assessment/Plan: 3 Days Post-Op   Active Problems:   S/P TKR (total knee replacement) using cement   S/P total knee replacement   Plan for discharge today for home. Order placed  Will plan HHPT order today.   Recommended elevating knee above heart to reduce swelling, frequent icing.  Follow up with Dr. Tonita Cong in 2 weeks at [facility].    Faythe Casa 07/17/2020, 9:01 AM  Jonelle Sidle PA-C  Physician Assistant with Dr. Lillia Abed [place]

## 2020-07-20 ENCOUNTER — Ambulatory Visit (HOSPITAL_BASED_OUTPATIENT_CLINIC_OR_DEPARTMENT_OTHER)
Admission: RE | Admit: 2020-07-20 | Discharge: 2020-07-20 | Disposition: A | Discharge status: Home / Self Care | Payer: Medicare Other | Source: Ambulatory Visit | Attending: Specialist | Admitting: Specialist

## 2020-07-20 ENCOUNTER — Other Ambulatory Visit (HOSPITAL_BASED_OUTPATIENT_CLINIC_OR_DEPARTMENT_OTHER): Payer: Self-pay | Admitting: Specialist

## 2020-07-20 ENCOUNTER — Other Ambulatory Visit: Payer: Self-pay

## 2020-07-20 DIAGNOSIS — M79604 Pain in right leg: Secondary | ICD-10-CM | POA: Insufficient documentation

## 2020-07-21 ENCOUNTER — Ambulatory Visit (HOSPITAL_COMMUNITY): Payer: Medicare Other

## 2020-07-23 NOTE — Discharge Summary (Signed)
Physician Discharge Summary   Patient ID: Joshua Bowers MRN: [id number] DOB/AGE: 05/31/1950 70 y.o.  Admit date: 07/14/2020 Discharge date: 07/17/2020  Primary Diagnosis: left knee primary osteoarthritis  Admission Diagnoses:  Past Medical History:  Diagnosis Date  . Arthritis    hands  . Bladder outlet obstruction   . BPH (benign prostatic hyperplasia)   . Elevated PSA    per pt 2013  prostate bx negative for cancer  . Exposure to asbestos    per pt followed by a law firm in [place],  told last CT 2018 okay  . GERD (gastroesophageal reflux disease)   . History of bladder cancer    per pt 2011-- s/p  TURBT superficial per pt  . Hypertension   . Incomplete right bundle branch block   . Lower urinary tract symptoms (LUTS)    Discharge Diagnoses:   Active Problems:   S/P TKR (total knee replacement) using cement   S/P total knee replacement  Estimated body mass index is 25.24 kg/m as calculated from the following:   Height as of this encounter: 5\' 11"  (1.803 m).   Weight as of this encounter: 82.1 kg.  Procedure:  Procedure(s) (LRB): TOTAL KNEE ARTHROPLASTY (Left)   Consults: None  HPI: see H&P Laboratory Data: Admission on 07/14/2020, Discharged on 07/17/2020  Component Date Value Ref Range Status  . WBC 07/15/2020 17.3* 4.0 - 10.5 K/uL Final  . RBC 07/15/2020 4.35  4.22 - 5.81 MIL/uL Final  . Hemoglobin 07/15/2020 13.4  13.0 - 17.0 g/dL Final  . HCT 07/15/2020 39.6  39.0 - 52.0 % Final  . MCV 07/15/2020 91.0  80.0 - 100.0 fL Final  . MCH 07/15/2020 30.8  26.0 - 34.0 pg Final  . MCHC 07/15/2020 33.8  30.0 - 36.0 g/dL Final  . RDW 07/15/2020 12.8  11.5 - 15.5 % Final  . Platelets 07/15/2020 277  150 - 400 K/uL Final  . nRBC 07/15/2020 0.0  0.0 - 0.2 % Final   Performed at [facility], [place] [address], [place], [place] [address]  . Sodium 07/15/2020 138  135 - 145 mmol/L Final  . Potassium 07/15/2020 3.8  3.5 - 5.1 mmol/L Final  .  Chloride 07/15/2020 104  98 - 111 mmol/L Final  . CO2 07/15/2020 24  22 - 32 mmol/L Final  . Glucose, Bld 07/15/2020 128* 70 - 99 mg/dL Final   Glucose reference range applies only to samples taken after fasting for at least 8 hours.  . BUN 07/15/2020 18  8 - 23 mg/dL Final  . Creatinine, Ser 07/15/2020 1.17  0.61 - 1.24 mg/dL Final  . Calcium 07/15/2020 8.6* 8.9 - 10.3 mg/dL Final  . GFR, Estimated 07/15/2020 >60  >60 mL/min Final   Comment: (NOTE) Calculated using the CKD-EPI Creatinine Equation (2021)   . Anion gap 07/15/2020 10  5 - 15 Final   Performed at [facility], [place] [address], [place], [place] [address]  . WBC 07/16/2020 11.7* 4.0 - 10.5 K/uL Final  . RBC 07/16/2020 4.25  4.22 - 5.81 MIL/uL Final  . Hemoglobin 07/16/2020 13.0  13.0 - 17.0 g/dL Final  . HCT 07/16/2020 39.0  39.0 - 52.0 % Final  . MCV 07/16/2020 91.8  80.0 - 100.0 fL Final  . MCH 07/16/2020 30.6  26.0 - 34.0 pg Final  . MCHC 07/16/2020 33.3  30.0 - 36.0 g/dL Final  . RDW 07/16/2020 13.1  11.5 - 15.5 % Final  .  Platelets 07/16/2020 265  150 - 400 K/uL Final  . nRBC 07/16/2020 0.0  0.0 - 0.2 % Final   Performed at [facility], [place] [address], [place], [place] [address]  . WBC 07/17/2020 12.5* 4.0 - 10.5 K/uL Final  . RBC 07/17/2020 3.94* 4.22 - 5.81 MIL/uL Final  . Hemoglobin 07/17/2020 12.3* 13.0 - 17.0 g/dL Final  . HCT 07/17/2020 36.3* 39.0 - 52.0 % Final  . MCV 07/17/2020 92.1  80.0 - 100.0 fL Final  . MCH 07/17/2020 31.2  26.0 - 34.0 pg Final  . MCHC 07/17/2020 33.9  30.0 - 36.0 g/dL Final  . RDW 07/17/2020 12.9  11.5 - 15.5 % Final  . Platelets 07/17/2020 246  150 - 400 K/uL Final  . nRBC 07/17/2020 0.0  0.0 - 0.2 % Final   Performed at [facility], [place] [address], [place], [place] [address]  Hospital Outpatient Visit on 07/10/2020  Component Date Value Ref Range Status  . SARS Coronavirus 2 07/10/2020 NEGATIVE  NEGATIVE Final   Comment:  (NOTE) SARS-CoV-2 target nucleic acids are NOT DETECTED.  The SARS-CoV-2 RNA is generally detectable in upper and lower respiratory specimens during the acute phase of infection. Negative results do not preclude SARS-CoV-2 infection, do not rule out co-infections with other pathogens, and should not be used as the sole basis for treatment or other patient management decisions. Negative results must be combined with clinical observations, patient history, and epidemiological information. The expected result is Negative.  Fact Sheet for Patients: SugarRoll.be  Fact Sheet for Healthcare Providers: https://www.woods-mathews.com/  This test is not yet approved or cleared by the [place] FDA and  has been authorized for detection and/or diagnosis of SARS-CoV-2 by FDA under an Emergency Use Authorization (EUA). This EUA will remain  in effect (meaning this test can be used) for the duration of the COVID-19 declaration under Se                          ction 564(b)(1) of the Act, 21 U.S.C. section 360bbb-3(b)(1), unless the authorization is terminated or revoked sooner.  Performed at [facility], [place] [address] [place], [place] [address]   Hospital Outpatient Visit on 07/05/2020  Component Date Value Ref Range Status  . WBC 07/05/2020 6.1  4.0 - 10.5 K/uL Final  . RBC 07/05/2020 5.20  4.22 - 5.81 MIL/uL Final  . Hemoglobin 07/05/2020 16.2  13.0 - 17.0 g/dL Final  . HCT 07/05/2020 48.1  39.0 - 52.0 % Final  . MCV 07/05/2020 92.5  80.0 - 100.0 fL Final  . MCH 07/05/2020 31.2  26.0 - 34.0 pg Final  . MCHC 07/05/2020 33.7  30.0 - 36.0 g/dL Final  . RDW 07/05/2020 12.9  11.5 - 15.5 % Final  . Platelets 07/05/2020 343  150 - 400 K/uL Final  . nRBC 07/05/2020 0.0  0.0 - 0.2 % Final   Performed at [facility], [place] [address] [place], [place] [address]  . Sodium 07/05/2020 139  135 - 145 mmol/L Final  . Potassium  07/05/2020 4.7  3.5 - 5.1 mmol/L Final  . Chloride 07/05/2020 101  98 - 111 mmol/L Final  . CO2 07/05/2020 28  22 - 32 mmol/L Final  . Glucose, Bld 07/05/2020 102* 70 - 99 mg/dL Final   Glucose reference range applies only to samples taken after fasting for at least 8 hours.  . BUN 07/05/2020 16  8 - 23 mg/dL Final  . Creatinine, Ser 07/05/2020 1.27* 0.61 - 1.24 mg/dL Final  . Calcium 07/05/2020 9.2  8.9 - 10.3 mg/dL Final  . GFR, Estimated 07/05/2020 >60  >60 mL/min Final   Comment: (NOTE) Calculated using the CKD-EPI Creatinine Equation (2021)   . Anion gap 07/05/2020 10  5 - 15 Final   Performed at [facility], [place] [address], [place], [place] [address]  . Prothrombin Time 07/05/2020 13.0  11.4 - 15.2 seconds Final  . INR 07/05/2020 1.0  0.8 - 1.2 Final   Comment: (NOTE) INR goal varies based on device and disease states. Performed at [facility], [place] [address], [place], [place] [address]   . aPTT 07/05/2020 32  24 - 36 seconds Final   Performed at [facility], [place] [address], [place], [place] [address]  . Color, Urine 07/05/2020 YELLOW  YELLOW Final  . APPearance 07/05/2020 CLEAR  CLEAR Final  . Specific Gravity, Urine 07/05/2020 1.009  1.005 - 1.030 Final  . pH 07/05/2020 6.0  5.0 - 8.0 Final  . Glucose, UA 07/05/2020 NEGATIVE  NEGATIVE mg/dL Final  . Hgb urine dipstick 07/05/2020 NEGATIVE  NEGATIVE Final  . Bilirubin Urine 07/05/2020 NEGATIVE  NEGATIVE Final  . Ketones, ur 07/05/2020 NEGATIVE  NEGATIVE mg/dL Final  . Protein, ur 07/05/2020 NEGATIVE  NEGATIVE mg/dL Final  . Nitrite 07/05/2020 NEGATIVE  NEGATIVE Final  . Chalmers Guest 07/05/2020 NEGATIVE  NEGATIVE Final   Performed at [facility], [place] [address] [place], [place] [address]  . MRSA, PCR 07/05/2020 NEGATIVE  NEGATIVE Final  . Staphylococcus aureus 07/05/2020 POSITIVE* NEGATIVE Final   Comment: (NOTE) The Xpert SA Assay (FDA  approved for NASAL specimens in patients 67 years of age and older), is one component of a comprehensive surveillance program. It is not intended to diagnose infection nor to guide or monitor treatment. Performed at [facility], [place] [address], [place],  [address]      X-Rays:DG Knee 2 Views Left  Result Date: 07/14/2020 CLINICAL DATA:  Postop left total knee arthroplasty. EXAM: LEFT KNEE - 1-2 VIEW COMPARISON:  None. FINDINGS: Patient is status post left total knee arthroplasty. The hardware is well positioned. There is no evidence of acute fracture or dislocation. There is gas in the joint and anterior soft tissues. No unexpected foreign body. IMPRESSION: No demonstrated complication following left total knee arthroplasty. Electronically Signed   By: Richardean Sale M.D.   On: 07/14/2020 11:12   US Venous Img Lower Unilateral Left (DVT)  Result Date: 07/20/2020 CLINICAL DATA:  Left leg pain EXAM: Left LOWER EXTREMITY VENOUS DOPPLER ULTRASOUND TECHNIQUE: Gray-scale sonography with compression, as well as color and duplex ultrasound, were performed to evaluate the deep venous system(s) from the level of the common femoral vein through the popliteal and proximal calf veins. COMPARISON:  None. FINDINGS: VENOUS Normal compressibility of the common femoral, superficial femoral, and popliteal veins, as well as the visualized calf veins. Visualized portions of profunda femoral vein and great saphenous vein unremarkable. No filling defects to suggest DVT on grayscale or color Doppler imaging. Doppler waveforms show normal direction of venous flow, normal respiratory plasticity and response to augmentation. Limited views of the contralateral common femoral vein are unremarkable. OTHER Edema within the skin and subcutaneous soft tissues of the left lower extremity. Limitations: none IMPRESSION: Negative. Electronically Signed   By: Donavan Foil M.D.   On: 07/20/2020 18:42   VAS  [place]  ABI WITH/WO TBI  Result Date: 07/17/2020 LOWER EXTREMITY DOPPLER STUDY Indications: Rest pain. High Risk Factors: None.  Limitations: Today's exam was limited due to involuntary patient movement. Comparison Study: No prior studies. Performing Technologist: Carlos Levering RVT  Examination Guidelines: A complete evaluation includes at minimum, Doppler waveform signals and systolic blood pressure reading at the level of bilateral brachial, anterior tibial, and posterior tibial arteries, when vessel segments are accessible. Bilateral testing is considered an integral part of a complete examination. Photoelectric Plethysmograph (PPG) waveforms and toe systolic pressure readings are included as required and additional duplex testing as needed. Limited examinations for reoccurring indications may be performed as noted.  ABI Findings: +---------+------------------+-----+---------+--------+ Right    Rt Pressure (mmHg)IndexWaveform Comment  +---------+------------------+-----+---------+--------+ Brachial 159                    triphasic         +---------+------------------+-----+---------+--------+ PTA      204               1.27 triphasic         +---------+------------------+-----+---------+--------+ DP       198               1.24 triphasic         +---------+------------------+-----+---------+--------+ Great Toe106               0.66                   +---------+------------------+-----+---------+--------+ +---------+------------------+-----+---------+-------+ Left     Lt Pressure (mmHg)IndexWaveform Comment +---------+------------------+-----+---------+-------+ Brachial 160                    triphasic        +---------+------------------+-----+---------+-------+ PTA      180               1.12 triphasic        +---------+------------------+-----+---------+-------+ DP       179               1.12 triphasic         +---------+------------------+-----+---------+-------+ Great Toe130               0.81                  +---------+------------------+-----+---------+-------+ +-------+-----------+-----------+------------+------------+ ABI/TBIToday's ABIToday's TBIPrevious ABIPrevious TBI +-------+-----------+-----------+------------+------------+ Right  1.28       0.66                                +-------+-----------+-----------+------------+------------+ Left   1.13       0.81                                +-------+-----------+-----------+------------+------------+  Summary: Right: Resting right ankle-brachial index is within normal range. No evidence of significant right lower extremity arterial disease. The right toe-brachial index is abnormal. Left: Resting left ankle-brachial index is within normal range. No evidence of significant left lower extremity arterial disease. The left toe-brachial index is normal.  *See table(s) above for measurements and observations.  Electronically signed by Ruta Hinds MD on 07/17/2020 at 10:44:08 AM.    Final    VAS [place] LOWER EXTREMITY VENOUS (DVT)  Result Date: 07/17/2020  Lower Venous DVT Study Indications: Pain.  Risk Factors: Surgery. Comparison Study: No prior studies. Performing Technologist: Oliver Hum  RVT  Examination Guidelines: A complete evaluation includes B-mode imaging, spectral Doppler, color Doppler, and power Doppler as needed of all accessible portions of each vessel. Bilateral testing is considered an integral part of a complete examination. Limited examinations for reoccurring indications may be performed as noted. The reflux portion of the exam is performed with the patient in reverse Trendelenburg.  +-----+---------------+---------+-----------+----------+--------------+ RIGHTCompressibilityPhasicitySpontaneityPropertiesThrombus Aging +-----+---------------+---------+-----------+----------+--------------+ CFV  Full            Yes      Yes                                 +-----+---------------+---------+-----------+----------+--------------+   +---------+---------------+---------+-----------+----------+--------------+ LEFT     CompressibilityPhasicitySpontaneityPropertiesThrombus Aging +---------+---------------+---------+-----------+----------+--------------+ CFV      Full           Yes      Yes                                 +---------+---------------+---------+-----------+----------+--------------+ SFJ      Full                                                        +---------+---------------+---------+-----------+----------+--------------+ FV Prox  Full                                                        +---------+---------------+---------+-----------+----------+--------------+ FV Mid   Full                                                        +---------+---------------+---------+-----------+----------+--------------+ FV DistalFull                                                        +---------+---------------+---------+-----------+----------+--------------+ PFV      Full                                                        +---------+---------------+---------+-----------+----------+--------------+ POP      Full           Yes      Yes                                 +---------+---------------+---------+-----------+----------+--------------+ PTV      Full                                                        +---------+---------------+---------+-----------+----------+--------------+  PERO     Full                                                        +---------+---------------+---------+-----------+----------+--------------+ Triphasic waveforms are noted in the posterior tibial, peroneal, and anterior tibial arteries.    Summary: RIGHT: - No evidence of common femoral vein obstruction.  LEFT: - There is no evidence of deep vein thrombosis in the  lower extremity.  - No cystic structure found in the popliteal fossa.  *See table(s) above for measurements and observations. Electronically signed by Ruta Hinds MD on 07/17/2020 at 10:44:17 AM.    Final     EKG: Orders placed or performed during the hospital encounter of 07/14/20  . EKG 12-Lead  . EKG 12-Lead     Hospital Course: Joshua Bowers is a 70 y.o. who was admitted to [facility]. They were brought to the operating room on 07/14/2020 and underwent Procedure(s): TOTAL KNEE ARTHROPLASTY.  Patient tolerated the procedure well and was later transferred to the recovery room and then to the orthopaedic floor for postoperative care.  They were given PO and IV analgesics for pain control following their surgery.  They were given 24 hours of postoperative antibiotics of  Anti-infectives (From admission, onward)   Start     Dose/Rate Route Frequency Ordered Stop   07/14/20 1400  ceFAZolin (ANCEF) IVPB 2g/100 mL premix        2 g 200 mL/hr over 30 Minutes Intravenous Every 6 hours 07/14/20 1029 07/14/20 2126   07/14/20 0600  ceFAZolin (ANCEF) IVPB 2g/100 mL premix        2 g 200 mL/hr over 30 Minutes Intravenous On call to O.R. 07/14/20 0535 07/14/20 0805   07/14/20 0600  clindamycin (CLEOCIN) IVPB 900 mg        900 mg 100 mL/hr over 30 Minutes Intravenous On call to O.R. 07/14/20 0535 07/14/20 0800     and started on DVT prophylaxis in the form of Aspirin, TED hose and SCDs.   PT and OT were ordered for total joint protocol.  Discharge planning consulted to help with postop disposition and equipment needs.  Patient had a fair night on the evening of surgery.  They started to get up OOB with therapy on day one. Had significant swelling because of suboptimal elevation of the leg.  Continued to work with therapy into day two.    By day three, the patient had progressed with therapy and meeting their goals.  Incision was healing well.  Patient was seen in rounds and was ready to go  home.   Diet: Regular diet Activity:WBAT Follow-up:in 10-14 days Disposition - Home Discharged Condition: good   Discharge Instructions    Call MD / Call 911   Complete by: As directed    If you experience chest pain or shortness of breath, CALL 911 and be transported to the hospital emergency room.  If you develope a fever above 101 F, pus (white drainage) or increased drainage or redness at the wound, or calf pain, call your surgeon's office.   Constipation Prevention   Complete by: As directed    Drink plenty of fluids.  Prune juice may be helpful.  You may use a stool softener, such as Colace (over the counter) 100 mg twice a day.  Use MiraLax (over the counter) for constipation as needed.   Diet - low sodium heart healthy   Complete by: As directed    Face-to-face encounter (required for Medicare/Medicaid patients)   Complete by: As directed    I Faythe Casa certify that this patient is under my care and that I, or a nurse practitioner or physician's assistant working with me, had a face-to-face encounter that meets the physician face-to-face encounter requirements with this patient on 07/17/2020. The encounter with the patient was in whole, or in part for the following medical condition(s) which is the primary reason for home health care (List medical condition): s/p Left knee total knee replacement   The encounter with the patient was in whole, or in part, for the following medical condition, which is the primary reason for home health care: S/p Left total knee replacement   I certify that, based on my findings, the following services are medically necessary home health services: Physical therapy   Reason for Medically Necessary Home Health Services:  Therapy- Personnel officer, Public librarian Therapy- Therapeutic Exercises to Increase Strength and Endurance Therapy- Instruction on use of Assistive Device for Ambulation on all Surfaces     My clinical findings  support the need for the above services: Unable to leave home safely without assistance and/or assistive device   Further, I certify that my clinical findings support that this patient is homebound due to:  Ambulates short distances less than 300 feet Pain interferes with ambulation/mobility     Home Health   Complete by: As directed    To provide the following care/treatments: PT   Increase activity slowly as tolerated   Complete by: As directed      Allergies as of 07/17/2020   No Known Allergies     Medication List    STOP taking these medications   Krill Oil 350 MG Caps     TAKE these medications   amLODipine 5 MG tablet Commonly known as: NORVASC Take 5 mg by mouth every morning.   aspirin EC 81 MG tablet Take 1 tablet (81 mg total) by mouth 2 (two) times daily after a meal. Day after surgery What changed:   medication strength  how much to take  when to take this  additional instructions   b complex vitamins capsule Take 1 capsule by mouth daily.   docusate sodium 100 MG capsule Commonly known as: Colace Take 1 capsule (100 mg total) by mouth 2 (two) times daily as needed for mild constipation.   LORazepam 0.5 MG tablet Commonly known as: ATIVAN Take 1-2 tablets (0.5-1 mg total) by mouth every 4 (four) hours as needed for anxiety.   losartan 100 MG tablet Commonly known as: COZAAR Take 100 mg by mouth every morning.   multivitamin with minerals Tabs tablet Take 1 tablet by mouth daily.   oxyCODONE-acetaminophen 5-325 MG tablet Commonly known as: Percocet Take 1-2 tablets by mouth every 4 (four) hours as needed for severe pain.   tiZANidine 4 MG tablet Commonly known as: Zanaflex Take 1 tablet (4 mg total) by mouth 2 (two) times daily as needed for muscle spasms.   vitamin C 500 MG tablet Commonly known as: ASCORBIC ACID Take 500 mg by mouth daily.   zinc gluconate 50 MG tablet Take 50 mg by mouth daily.       Follow-up Information     [name], MD Follow up in 2 week(s).   Specialty: Orthopedic Surgery Contact  information: [address] [place] [address] [phone]        [facility] Follow up.   Specialty: [place] Why: agency will provide home health physical therapy Contact information: [place] [address] [phone]               Signed: Lacie Draft, PA-C Orthopaedic Surgery 07/23/2020, 1:11 PM

## 2020-08-09 ENCOUNTER — Other Ambulatory Visit: Payer: Self-pay

## 2020-08-09 ENCOUNTER — Ambulatory Visit: Payer: Medicare Other | Attending: Specialist | Admitting: Physical Therapy

## 2020-08-09 ENCOUNTER — Encounter: Payer: Self-pay | Admitting: Physical Therapy

## 2020-08-09 DIAGNOSIS — M6281 Muscle weakness (generalized): Secondary | ICD-10-CM | POA: Diagnosis present

## 2020-08-09 DIAGNOSIS — G8929 Other chronic pain: Secondary | ICD-10-CM | POA: Diagnosis present

## 2020-08-09 DIAGNOSIS — M25562 Pain in left knee: Secondary | ICD-10-CM | POA: Insufficient documentation

## 2020-08-09 DIAGNOSIS — M25662 Stiffness of left knee, not elsewhere classified: Secondary | ICD-10-CM | POA: Diagnosis present

## 2020-08-09 DIAGNOSIS — R600 Localized edema: Secondary | ICD-10-CM | POA: Diagnosis present

## 2020-08-09 NOTE — Therapy (Signed)
[facility] [place], [place], [address] Phone: [phone]   Fax:  [phone]  Physical Therapy Evaluation  Patient Details  Name: Joshua Bowers MRN: [id number] Date of Birth: 03/31/1951 Referring Provider (PT): [name] MD   Encounter Date: 08/09/2020   PT End of Session - 08/09/20 1243    Visit Number 1    Number of Visits 12    Date for PT Re-Evaluation 09/06/20    Authorization Type FOTO AT LEAST EVERY 5TH VISIT.  PROGRESS NOTE AT 10TH VISIT.  KX MODIFIER AFTER 15 VISITS.    PT Start Time 1030    PT Stop Time 1125    PT Time Calculation (min) 55 min    Activity Tolerance Patient tolerated treatment well    Behavior During Therapy WFL for tasks assessed/performed           Past Medical History:  Diagnosis Date  . Arthritis    hands  . Bladder outlet obstruction   . BPH (benign prostatic hyperplasia)   . Elevated PSA    per pt 2013  prostate bx negative for cancer  . Exposure to asbestos    per pt followed by a law firm in [place],  told last CT 2018 okay  . GERD (gastroesophageal reflux disease)   . History of bladder cancer    per pt 2011-- s/p  TURBT superficial per pt  . Hypertension   . Incomplete right bundle branch block   . Lower urinary tract symptoms (LUTS)     Past Surgical History:  Procedure Laterality Date  . APPENDECTOMY  age 52s  . INGUINAL HERNIA REPAIR Right 12/27/2015   Procedure: HERNIA REPAIR RIGHT INGUINAL ADULT WITH MESH;  Surgeon: [name], MD;  Location: [place];  Service: General;  Laterality: Right;  . INSERTION OF MESH Right 12/27/2015   Procedure: INSERTION OF MESH;  Surgeon: [name], MD;  Location: [place];  Service: General;  Laterality: Right;  . TONSILLECTOMY  age 58  . TOTAL KNEE ARTHROPLASTY Left 07/14/2020   Procedure: TOTAL KNEE ARTHROPLASTY;  Surgeon: [name], MD;  Location: [facility];  Service:  Orthopedics;  Laterality: Left;  2.5 hrs  . TRANSURETHRAL RESECTION OF PROSTATE N/A 11/20/2017   Procedure: TRANSURETHRAL RESECTION OF THE PROSTATE (TURP);  Surgeon: [name], MD;  Location: [facility];  Service: Urology;  Laterality: N/A;  . ULNAR TUNNEL RELEASE Bilateral 2012;  2009  . WRIST SURGERY Left 2013   no hardware    There were no vitals filed for this visit.    Subjective Assessment - 08/09/20 1213    Subjective COVID-19 screen performed prior to patient entering clinic.  The patient presents to the clinic today s/p left total knee replacement performed on 07/14/20.  He had a 4 day hospital stay.  He states he had a great deal of swelling and received a Doppler in the hospital and later after being discharged home via the ED.  Both test were negative for DVT.  Today. at rest his pain-level is a 2/10.  Exercising increases his pain.    Pertinent History HTN, OA, BPH, hernia repair, left wrist surgery, bilateral Ulnar nerve release suregry, left wrist surgery,incomplete right BBB, GERD.    How long can you walk comfortably? Around home mostly.    Currently in Pain? Yes    Pain Score 2     Pain Location Knee    Pain Orientation Left  Pain Descriptors / Indicators Throbbing    Pain Type Chronic pain              [facility] PT Assessment - 08/09/20 0001      Assessment   Medical Diagnosis Left total knee replacement.    Referring Provider (PT) [name] MD    Onset Date/Surgical Date 07/14/20      Precautions   Precaution Comments No ultrasound.      Restrictions   Weight Bearing Restrictions No      Balance Screen   Has the patient fallen in the past 6 months No    Has the patient had a decrease in activity level because of a fear of falling?  No    Is the patient reluctant to leave their home because of a fear of falling?  No      Home Environment   Living Environment Private residence      Prior Function   Level of Independence Independent       Observation/Other Assessments   Observations Left knee incision covered with steri-strips.  Patient uses a right heel lift due to his right LE being shorter than left.    Focus on Therapeutic Outcomes (FOTO)  Complete.      ROM / Strength   AROM / PROM / Strength AROM;Strength      AROM   Overall AROM Comments -17 degrees of active right knee extension in supine and passive to -12 degrees with active flexion to 93 degrees and passive to 97 degrees.      Strength   Overall Strength Comments Left antigravity SLR and SAQ performed with ease.  Left quadriceps atrophy observed.      Palpation   Palpation comment Diffuse anterior left knee tenderness.      Special Tests   Other special tests Circumferential measurements taken at mid-patellar region is 42 cms on left and 37 cms on right.      Ambulation/Gait   Gait Comments Decreased stance time over his left knee with his knee held in flexion during the gait cycle.                      Objective measurements completed on examination: See above findings.       [facility] Adult PT Treatment/Exercise - 08/09/20 0001      Exercises   Exercises Knee/Hip      Knee/Hip Exercises: Aerobic   Nustep Level 1 x 10 minutes moving seat forward x 2 to increase knee flexion.      Modalities   Modalities Vasopneumatic      Vasopneumatic   Number Minutes Vasopneumatic  15 minutes    Vasopnuematic Location  --   LT KNEE.   Vasopneumatic Pressure Low                       PT Long Term Goals - 08/09/20 1241      PT LONG TERM GOAL #1   Title Independent with a HEP.    Time 4    Period Weeks    Status New      PT LONG TERM GOAL #2   Title Full left active knee extension in order to normalize gait.    Time 4    Period Weeks    Status New      PT LONG TERM GOAL #3   Title Active knee flexion to 115 degrees+ so the patient can perform functional tasks  and do so with pain not > 2-3/10.    Time 4    Period  Weeks    Status New      PT LONG TERM GOAL #4   Title Increase knee strength to a solid 5/5 to provide good stability for accomplishment of functional activities.    Time 4    Period Weeks    Status New      PT LONG TERM GOAL #5   Title Perform a reciprocating stair gait with one railing with pain not > 2-3/10.    Time 4    Period Weeks    Status New                  Plan - 08/09/20 1230    Clinical Impression Statement The patient presents to [facility] s/p left TKA performed on 07/14/20.  He currently lacks flexion and extension.  He is walking without an assisitive device with his knee held in flexion.  He easily perfoms an antigravity SLR and SAQ but has quadriceps atrophy.  He has moderate left knee edema.  He has had two Doppler that were negative for DVT's.Patient will benefit from skilled physical therapy intervention to address pain and deficits.    Personal Factors and Comorbidities Comorbidity 1;Comorbidity 2;Other    Comorbidities HTN, OA, BPH, hernia repair, left wrist surgery, bilateral Ulnar nerve release surgery left wrist surgery,incomplete right BBB, GERD.    Examination-Activity Limitations Other;Locomotion Level    Examination-Participation Restrictions Other    Stability/Clinical Decision Making Stable/Uncomplicated    Clinical Decision Making Low    Rehab Potential Excellent    PT Frequency 3x / week    PT Duration 4 weeks    PT Treatment/Interventions ADLs/Self Care Home Management;Cryotherapy;Electrical Stimulation;Moist Heat;Gait training;Stair training;Functional mobility training;Therapeutic activities;Therapeutic exercise;Neuromuscular re-education;Manual techniques;Patient/family education;Passive range of motion;Vasopneumatic Device    PT Next Visit Plan VMS to left quadriceps during SAQ exercise, Nustep with progression to recumbant bike.  PROM.  PRE's.  E'stim and vasopneumatic.    Consulted and Agree with Plan of Care Patient           Patient  will benefit from skilled therapeutic intervention in order to improve the following deficits and impairments:  Abnormal gait,Decreased activity tolerance,Decreased range of motion,Decreased strength,Increased edema,Pain  Visit Diagnosis: Chronic pain of left knee - Plan: PT plan of care cert/re-cert  Localized edema - Plan: PT plan of care cert/re-cert  Stiffness of left knee, not elsewhere classified - Plan: PT plan of care cert/re-cert  Muscle weakness (generalized) - Plan: PT plan of care cert/re-cert     Problem List Patient Active Problem List   Diagnosis Date Noted  . S/P total knee replacement 07/15/2020  . S/P TKR (total knee replacement) using cement 07/14/2020  . BPH with obstruction/lower urinary tract symptoms 11/20/2017  . S/P hernia repair 12/27/2015    Mychael Smock, [place] MPT 08/09/2020, 12:45 PM  [facility] [address] [place], [place], [address] Phone: [phone]   Fax:  [phone]  Name: Joshua Bowers MRN: [id number] Date of Birth: Aug 08, 1950

## 2020-08-11 ENCOUNTER — Ambulatory Visit: Payer: Medicare Other | Admitting: Physical Therapy

## 2020-08-11 ENCOUNTER — Other Ambulatory Visit: Payer: Self-pay

## 2020-08-11 DIAGNOSIS — M25662 Stiffness of left knee, not elsewhere classified: Secondary | ICD-10-CM

## 2020-08-11 DIAGNOSIS — G8929 Other chronic pain: Secondary | ICD-10-CM

## 2020-08-11 DIAGNOSIS — R600 Localized edema: Secondary | ICD-10-CM

## 2020-08-11 DIAGNOSIS — M25562 Pain in left knee: Secondary | ICD-10-CM | POA: Diagnosis not present

## 2020-08-11 DIAGNOSIS — M6281 Muscle weakness (generalized): Secondary | ICD-10-CM

## 2020-08-11 NOTE — Therapy (Signed)
[facility] [place], [place], [address] Phone: [phone]   Fax:  [phone]  Physical Therapy Treatment  Patient Details  Name: Joshua Bowers MRN: [id number] Date of Birth: 12-15-1950 Referring Provider (PT): [name] MD   Encounter Date: 08/11/2020   PT End of Session - 08/11/20 1238    Visit Number 2    Number of Visits 12    Date for PT Re-Evaluation 09/06/20    Authorization Type FOTO AT LEAST EVERY 5TH VISIT.  PROGRESS NOTE AT 10TH VISIT.  KX MODIFIER AFTER 15 VISITS.    PT Start Time 0900    PT Stop Time [phone]    PT Time Calculation (min) 52 min    Activity Tolerance Patient tolerated treatment well    Behavior During Therapy WFL for tasks assessed/performed           Past Medical History:  Diagnosis Date  . Arthritis    hands  . Bladder outlet obstruction   . BPH (benign prostatic hyperplasia)   . Elevated PSA    per pt 2013  prostate bx negative for cancer  . Exposure to asbestos    per pt followed by a law firm in [place],  told last CT 2018 okay  . GERD (gastroesophageal reflux disease)   . History of bladder cancer    per pt 2011-- s/p  TURBT superficial per pt  . Hypertension   . Incomplete right bundle branch block   . Lower urinary tract symptoms (LUTS)     Past Surgical History:  Procedure Laterality Date  . APPENDECTOMY  age 25s  . INGUINAL HERNIA REPAIR Right 12/27/2015   Procedure: HERNIA REPAIR RIGHT INGUINAL ADULT WITH MESH;  Surgeon: [name], MD;  Location: [facility];  Service: General;  Laterality: Right;  . INSERTION OF MESH Right 12/27/2015   Procedure: INSERTION OF MESH;  Surgeon: [name], MD;  Location: [facility];  Service: General;  Laterality: Right;  . TONSILLECTOMY  age 62  . TOTAL KNEE ARTHROPLASTY Left 07/14/2020   Procedure: TOTAL KNEE ARTHROPLASTY;  Surgeon: [name], MD;  [facility]: [facility];  Service:  Orthopedics;  Laterality: Left;  2.5 hrs  . TRANSURETHRAL RESECTION OF PROSTATE N/A 11/20/2017   Procedure: TRANSURETHRAL RESECTION OF THE PROSTATE (TURP);  Surgeon: [name], MD;  Location: [facility];  Service: Urology;  Laterality: N/A;  . ULNAR TUNNEL RELEASE Bilateral 2012;  2009  . WRIST SURGERY Left 2013   no hardware    There were no vitals filed for this visit.   Subjective Assessment - 08/11/20 1233    Subjective COVID-19 screen performed prior to patient entering clinic.  No new complaints.    Pertinent History HTN, OA, BPH, hernia repair, left wrist surgery, bilateral Ulnar nerve release surgery, left wrist surgery,incomplete right BBB, GERD.    How long can you walk comfortably? Around home mostly.    Currently in Pain? Yes    Pain Score 2     Pain Location Knee    Pain Orientation Left    Pain Descriptors / Indicators Throbbing    Pain Type Chronic pain                             [facility] Adult PT Treatment/Exercise - 08/11/20 0001      Exercises   Exercises Knee/Hip      Knee/Hip Exercises: Aerobic  Recumbent Bike Level 1 x 5 minutes.    Nustep Level 3 x 10 minutes.      Knee/Hip Exercises: Supine   Short Arc Quad Sets Limitations SAQ's x 16 minutes facilittaed with VMS to patient's left quads with 10 sec extesnion holds and 10 sec rest.      Vasopneumatic   Number Minutes Vasopneumatic  10 minutes    Vasopnuematic Location  --   Left knee.   Vasopneumatic Pressure Low                       PT Long Term Goals - 08/09/20 1241      PT LONG TERM GOAL #1   Title Independent with a HEP.    Time 4    Period Weeks    Status New      PT LONG TERM GOAL #2   Title Full left active knee extension in order to normalize gait.    Time 4    Period Weeks    Status New      PT LONG TERM GOAL #3   Title Active knee flexion to 115 degrees+ so the patient can perform functional tasks and do so with pain not > 2-3/10.     Time 4    Period Weeks    Status New      PT LONG TERM GOAL #4   Title Increase knee strength to a solid 5/5 to provide good stability for accomplishment of functional activities.    Time 4    Period Weeks    Status New      PT LONG TERM GOAL #5   Title Perform a reciprocating stair gait with one railing with pain not > 2-3/10.    Time 4    Period Weeks    Status New                 Plan - 08/11/20 1239    Clinical Impression Statement The patient did great today and was able to make forward evolutions on the recumbent bike today.    Personal Factors and Comorbidities Comorbidity 1;Comorbidity 2;Other    Comorbidities HTN, OA, BPH, hernia repair, left wrist surgery, bilateral Ulnar nerve release surgery left wrist surgery,incomplete right BBB, GERD.    Examination-Activity Limitations Other;Locomotion Level    Examination-Participation Restrictions Other    Stability/Clinical Decision Making Stable/Uncomplicated    Rehab Potential Excellent    PT Frequency 3x / week    PT Duration 4 weeks    PT Treatment/Interventions ADLs/Self Care Home Management;Cryotherapy;Electrical Stimulation;Moist Heat;Gait training;Stair training;Functional mobility training;Therapeutic activities;Therapeutic exercise;Neuromuscular re-education;Manual techniques;Patient/family education;Passive range of motion;Vasopneumatic Device    PT Next Visit Plan VMS to left quadriceps during SAQ exercise, Nustep with progression to recumbant bike.  PROM.  PRE's.  E'stim and vasopneumatic.    Consulted and Agree with Plan of Care Patient           Patient will benefit from skilled therapeutic intervention in order to improve the following deficits and impairments:  Abnormal gait,Decreased activity tolerance,Decreased range of motion,Decreased strength,Increased edema,Pain  Visit Diagnosis: Chronic pain of left knee  Localized edema  Stiffness of left knee, not elsewhere classified  Muscle weakness  (generalized)     Problem List Patient Active Problem List   Diagnosis Date Noted  . S/P total knee replacement 07/15/2020  . S/P TKR (total knee replacement) using cement 07/14/2020  . BPH with obstruction/lower urinary tract symptoms 11/20/2017  . S/P hernia  repair 12/27/2015    Brycelynn Stampley, [place] MPT 08/11/2020, 12:41 PM  [facility] [address] [place], [place], [address] Phone: [phone]   Fax:  [phone]  Name: Joshua Bowers MRN: [id number] Date of Birth: 06-Apr-1951

## 2020-08-13 ENCOUNTER — Ambulatory Visit: Payer: Medicare Other | Admitting: Physical Therapy

## 2020-08-13 ENCOUNTER — Other Ambulatory Visit: Payer: Self-pay

## 2020-08-13 DIAGNOSIS — M25662 Stiffness of left knee, not elsewhere classified: Secondary | ICD-10-CM

## 2020-08-13 DIAGNOSIS — M25562 Pain in left knee: Secondary | ICD-10-CM | POA: Diagnosis not present

## 2020-08-13 DIAGNOSIS — G8929 Other chronic pain: Secondary | ICD-10-CM

## 2020-08-13 DIAGNOSIS — R600 Localized edema: Secondary | ICD-10-CM

## 2020-08-13 DIAGNOSIS — M6281 Muscle weakness (generalized): Secondary | ICD-10-CM

## 2020-08-13 NOTE — Therapy (Signed)
[facility] [place], [place], [address] Phone: [phone]   Fax:  [phone]  Physical Therapy Treatment  Patient Details  Name: Joshua Bowers MRN: [id number] Date of Birth: Aug 01, 1950 Referring Provider (PT): [name] MD   Encounter Date: 08/13/2020   PT End of Session - 08/13/20 1258    Visit Number 3    Number of Visits 12    Date for PT Re-Evaluation 09/06/20    Authorization Type FOTO AT LEAST EVERY 5TH VISIT.  PROGRESS NOTE AT 10TH VISIT.  KX MODIFIER AFTER 15 VISITS.    PT Start Time 1115    PT Stop Time 1207    PT Time Calculation (min) 52 min    Activity Tolerance Patient tolerated treatment well    Behavior During Therapy WFL for tasks assessed/performed           Past Medical History:  Diagnosis Date  . Arthritis    hands  . Bladder outlet obstruction   . BPH (benign prostatic hyperplasia)   . Elevated PSA    per pt 2013  prostate bx negative for cancer  . Exposure to asbestos    per pt followed by a law firm in [place],  told last CT 2018 okay  . GERD (gastroesophageal reflux disease)   . History of bladder cancer    per pt 2011-- s/p  TURBT superficial per pt  . Hypertension   . Incomplete right bundle branch block   . Lower urinary tract symptoms (LUTS)     Past Surgical History:  Procedure Laterality Date  . APPENDECTOMY  age 45s  . INGUINAL HERNIA REPAIR Right 12/27/2015   Procedure: HERNIA REPAIR RIGHT INGUINAL ADULT WITH MESH;  Surgeon: [name], MD;  Location: [facility];  Service: General;  Laterality: Right;  . INSERTION OF MESH Right 12/27/2015   Procedure: INSERTION OF MESH;  Surgeon: [name], MD;  Location: [place] [facility] [place];  Service: General;  Laterality: Right;  . TONSILLECTOMY  age 69  . TOTAL KNEE ARTHROPLASTY Left 07/14/2020   Procedure: TOTAL KNEE ARTHROPLASTY;  Surgeon: [name], MD;  [facility];  Service:  Orthopedics;  Laterality: Left;  2.5 hrs  . TRANSURETHRAL RESECTION OF PROSTATE N/A 11/20/2017   Procedure: TRANSURETHRAL RESECTION OF THE PROSTATE (TURP);  Surgeon: [name], MD;  Location: [facility];  Service: Urology;  Laterality: N/A;  . ULNAR TUNNEL RELEASE Bilateral 2012;  2009  . WRIST SURGERY Left 2013   no hardware    There were no vitals filed for this visit.   Subjective Assessment - 08/13/20 1257    Subjective COVID-19 screen performed prior to patient entering clinic.  Doing good.    Pertinent History HTN, OA, BPH, hernia repair, left wrist surgery, bilateral Ulnar nerve release surgery, left wrist surgery,incomplete right BBB, GERD.    How long can you walk comfortably? Around home mostly.    Currently in Pain? Yes    Pain Score 2     Pain Orientation Left    Pain Descriptors / Indicators Throbbing    Pain Type Chronic pain              [facility] PT Assessment - 08/13/20 0001      AROM   Overall AROM Comments -12 to 110 degrees.                         [facility] Adult PT  Treatment/Exercise - 08/13/20 0001      Exercises   Exercises Knee/Hip      Knee/Hip Exercises: Aerobic   Recumbent Bike Level 2 x 10 minutes progressing to seat 7.    Nustep Level 3 x 5 minutes.      Knee/Hip Exercises: Supine   Short Arc Quad Sets Limitations SAQ's with 2# x 16 minutes facilitated with VMS to left quads with 10 sec extension holds and 10 sec rest.      Vasopneumatic   Number Minutes Vasopneumatic  10 minutes    Vasopnuematic Location  --   Left knee.   Vasopneumatic Pressure Low      Manual Therapy   Manual Therapy Passive ROM    Passive ROM In supine:  Left knee extension and flexion stretching x 4 minutes.                       PT Long Term Goals - 08/09/20 1241      PT LONG TERM GOAL #1   Title Independent with a HEP.    Time 4    Period Weeks    Status New      PT LONG TERM GOAL #2   Title Full left active knee  extension in order to normalize gait.    Time 4    Period Weeks    Status New      PT LONG TERM GOAL #3   Title Active knee flexion to 115 degrees+ so the patient can perform functional tasks and do so with pain not > 2-3/10.    Time 4    Period Weeks    Status New      PT LONG TERM GOAL #4   Title Increase knee strength to a solid 5/5 to provide good stability for accomplishment of functional activities.    Time 4    Period Weeks    Status New      PT LONG TERM GOAL #5   Title Perform a reciprocating stair gait with one railing with pain not > 2-3/10.    Time 4    Period Weeks    Status New                 Plan - 08/13/20 1303    Clinical Impression Statement The patient is making excellent progress with left knee range of motion -12 to 110 degrees.  Need work on extension.  Added an additional HEP for extension.    Personal Factors and Comorbidities Comorbidity 1;Comorbidity 2;Other    Comorbidities HTN, OA, BPH, hernia repair, left wrist surgery, bilateral Ulnar nerve release surgery left wrist surgery,incomplete right BBB, GERD.    Stability/Clinical Decision Making Stable/Uncomplicated    Rehab Potential Excellent    PT Frequency 3x / week    PT Duration 4 weeks    PT Treatment/Interventions ADLs/Self Care Home Management;Cryotherapy;Electrical Stimulation;Moist Heat;Gait training;Stair training;Functional mobility training;Therapeutic activities;Therapeutic exercise;Neuromuscular re-education;Manual techniques;Patient/family education;Passive range of motion;Vasopneumatic Device    PT Next Visit Plan VMS to left quadriceps during SAQ exercise, Nustep with progression to recumbant bike.  PROM.  PRE's.  E'stim and vasopneumatic.    Consulted and Agree with Plan of Care Patient           Patient will benefit from skilled therapeutic intervention in order to improve the following deficits and impairments:  Abnormal gait,Decreased activity tolerance,Decreased range of  motion,Decreased strength,Increased edema,Pain  Visit Diagnosis: Chronic pain of left knee  Localized edema  Stiffness of left knee, not elsewhere classified  Muscle weakness (generalized)     Problem List Patient Active Problem List   Diagnosis Date Noted  . S/P total knee replacement 07/15/2020  . S/P TKR (total knee replacement) using cement 07/14/2020  . BPH with obstruction/lower urinary tract symptoms 11/20/2017  . S/P hernia repair 12/27/2015    Emalie Mcwethy, [place] MPT 08/13/2020, 1:10 PM  [facility] [address] [place], [place], [address] Phone: [phone]   Fax:  [phone]  Name: Joshua Bowers MRN: [id number] Date of Birth: [birth year]

## 2020-08-16 ENCOUNTER — Ambulatory Visit: Payer: Medicare Other | Admitting: Physical Therapy

## 2020-08-16 ENCOUNTER — Other Ambulatory Visit: Payer: Self-pay

## 2020-08-16 DIAGNOSIS — R600 Localized edema: Secondary | ICD-10-CM

## 2020-08-16 DIAGNOSIS — M25662 Stiffness of left knee, not elsewhere classified: Secondary | ICD-10-CM

## 2020-08-16 DIAGNOSIS — G8929 Other chronic pain: Secondary | ICD-10-CM

## 2020-08-16 DIAGNOSIS — M25562 Pain in left knee: Secondary | ICD-10-CM | POA: Diagnosis not present

## 2020-08-16 DIAGNOSIS — M6281 Muscle weakness (generalized): Secondary | ICD-10-CM

## 2020-08-16 NOTE — Therapy (Signed)
[facility] [place], [place], [address] Phone: [phone]   Fax:  [phone]  Physical Therapy Treatment  Patient Details  Name: Joshua Bowers MRN: [id number] Date of Birth: 1950-08-29 Referring Provider (PT): [name] MD   Encounter Date: 08/16/2020   PT End of Session - 08/16/20 1346    Visit Number 4    Number of Visits 12    Date for PT Re-Evaluation 09/06/20    Authorization Type FOTO AT LEAST EVERY 5TH VISIT.  PROGRESS NOTE AT 10TH VISIT.  KX MODIFIER AFTER 15 VISITS.    PT Start Time 0102    PT Stop Time 0155    PT Time Calculation (min) 53 min    Activity Tolerance Patient tolerated treatment well    Behavior During Therapy WFL for tasks assessed/performed           Past Medical History:  Diagnosis Date  . Arthritis    hands  . Bladder outlet obstruction   . BPH (benign prostatic hyperplasia)   . Elevated PSA    per pt 2013  prostate bx negative for cancer  . Exposure to asbestos    per pt followed by a law firm in [place],  told last CT 2018 okay  . GERD (gastroesophageal reflux disease)   . History of bladder cancer    per pt 2011-- s/p  TURBT superficial per pt  . Hypertension   . Incomplete right bundle branch block   . Lower urinary tract symptoms (LUTS)     Past Surgical History:  Procedure Laterality Date  . APPENDECTOMY  age 7s  . INGUINAL HERNIA REPAIR Right 12/27/2015   Procedure: HERNIA REPAIR RIGHT INGUINAL ADULT WITH MESH;  Surgeon: [name], MD;  Location: [place];  Service: General;  Laterality: Right;  . INSERTION OF MESH Right 12/27/2015   Procedure: INSERTION OF MESH;  Surgeon: [name], MD;  Location: [facility];  Service: General;  Laterality: Right;  . TONSILLECTOMY  age 83  . TOTAL KNEE ARTHROPLASTY Left 07/14/2020   Procedure: TOTAL KNEE ARTHROPLASTY;  Surgeon: [name], MD;  Location: [facility];  Service:  Orthopedics;  Laterality: Left;  2.5 hrs  . TRANSURETHRAL RESECTION OF PROSTATE N/A 11/20/2017   Procedure: TRANSURETHRAL RESECTION OF THE PROSTATE (TURP);  Surgeon: [name], MD;  Location: [facility];  Service: Urology;  Laterality: N/A;  . ULNAR TUNNEL RELEASE Bilateral 2012;  2009  . WRIST SURGERY Left 2013   no hardware    There were no vitals filed for this visit.   Subjective Assessment - 08/16/20 1345    Subjective COVID-19 screen performed prior to patient entering clinic.  No new complaints.    Pertinent History HTN, OA, BPH, hernia repair, left wrist surgery, bilateral Ulnar nerve release surgery, left wrist surgery,incomplete right BBB, GERD.    How long can you walk comfortably? Around home mostly.    Currently in Pain? Yes    Pain Score 2     Pain Location Knee    Pain Orientation Left    Pain Descriptors / Indicators Throbbing    Pain Type Chronic pain                             [facility] Adult PT Treatment/Exercise - 08/16/20 0001      Exercises   Exercises Knee/Hip      Knee/Hip Exercises: Aerobic  Recumbent Bike level 2 x 15 minutes progressing to seat 6.      Knee/Hip Exercises: Supine   Short Arc Quad Sets Limitations SAQ's with 4# faciltated with VMS to patient's left quads x 16 minutes with 10 sec extension holds f/b 10 sec rest.      Modalities   Modalities Vasopneumatic      Vasopneumatic   Number Minutes Vasopneumatic  15 minutes    Vasopnuematic Location  --   Left knee.   Vasopneumatic Pressure Medium      Manual Therapy   Manual Therapy Passive ROM    Passive ROM In supine:  3 minutes sustained LLLDS into left knee extension.                       PT Long Term Goals - 08/09/20 1241      PT LONG TERM GOAL #1   Title Independent with a HEP.    Time 4    Period Weeks    Status New      PT LONG TERM GOAL #2   Title Full left active knee extension in order to normalize gait.    Time 4     Period Weeks    Status New      PT LONG TERM GOAL #3   Title Active knee flexion to 115 degrees+ so the patient can perform functional tasks and do so with pain not > 2-3/10.    Time 4    Period Weeks    Status New      PT LONG TERM GOAL #4   Title Increase knee strength to a solid 5/5 to provide good stability for accomplishment of functional activities.    Time 4    Period Weeks    Status New      PT LONG TERM GOAL #5   Title Perform a reciprocating stair gait with one railing with pain not > 2-3/10.    Time 4    Period Weeks    Status New                 Plan - 08/16/20 1348    Clinical Impression Statement The patient is making excellent progress.  Tolerated 4# SAQ's without complaint.  Left knee passive extension is improving.    Personal Factors and Comorbidities Comorbidity 1;Comorbidity 2;Other    Comorbidities HTN, OA, BPH, hernia repair, left wrist surgery, bilateral Ulnar nerve release surgery left wrist surgery,incomplete right BBB, GERD.    Examination-Activity Limitations Other;Locomotion Level    Examination-Participation Restrictions Other    Stability/Clinical Decision Making Stable/Uncomplicated    Rehab Potential Excellent    PT Frequency 3x / week    PT Duration 4 weeks    PT Treatment/Interventions ADLs/Self Care Home Management;Cryotherapy;Electrical Stimulation;Moist Heat;Gait training;Stair training;Functional mobility training;Therapeutic activities;Therapeutic exercise;Neuromuscular re-education;Manual techniques;Patient/family education;Passive range of motion;Vasopneumatic Device    PT Next Visit Plan Begin weight machines (ie:  knee ext, ham curls and leg press).    Consulted and Agree with Plan of Care Patient           Patient will benefit from skilled therapeutic intervention in order to improve the following deficits and impairments:  Abnormal gait,Decreased activity tolerance,Decreased range of motion,Decreased strength,Increased  edema,Pain  Visit Diagnosis: Chronic pain of left knee  Localized edema  Stiffness of left knee, not elsewhere classified  Muscle weakness (generalized)     Problem List Patient Active Problem List   Diagnosis Date Noted  .  S/P total knee replacement 07/15/2020  . S/P TKR (total knee replacement) using cement 07/14/2020  . BPH with obstruction/lower urinary tract symptoms 11/20/2017  . S/P hernia repair 12/27/2015    Aliesha Dolata, [place]  MPT 08/16/2020, 2:09 PM  [facility] [address] [place], [place], [address] Phone: [phone]   Fax:  [phone]  Name: Joshua Bowers MRN: [id number] Date of Birth: 04-22-1951

## 2020-08-18 ENCOUNTER — Other Ambulatory Visit: Payer: Self-pay

## 2020-08-18 ENCOUNTER — Ambulatory Visit: Payer: Medicare Other | Admitting: Physical Therapy

## 2020-08-18 DIAGNOSIS — R600 Localized edema: Secondary | ICD-10-CM

## 2020-08-18 DIAGNOSIS — G8929 Other chronic pain: Secondary | ICD-10-CM

## 2020-08-18 DIAGNOSIS — M6281 Muscle weakness (generalized): Secondary | ICD-10-CM

## 2020-08-18 DIAGNOSIS — M25562 Pain in left knee: Secondary | ICD-10-CM | POA: Diagnosis not present

## 2020-08-18 DIAGNOSIS — M25662 Stiffness of left knee, not elsewhere classified: Secondary | ICD-10-CM

## 2020-08-18 NOTE — Therapy (Signed)
[facility] [place], [place], [address] Phone: [phone]   Fax:  [phone]  Physical Therapy Treatment  Patient Details  Name: Joshua Bowers MRN: [id number] Date of Birth: 1951/04/20 Referring Provider (PT): [name] MD   Encounter Date: 08/18/2020   PT End of Session - 08/18/20 1536    Visit Number 5    Number of Visits 12    Date for PT Re-Evaluation 09/06/20    Authorization Type FOTO AT LEAST EVERY 5TH VISIT.  PROGRESS NOTE AT 10TH VISIT.  KX MODIFIER AFTER 15 VISITS.    PT Start Time 0100    PT Stop Time 0157    PT Time Calculation (min) 57 min    Activity Tolerance Patient tolerated treatment well    Behavior During Therapy WFL for tasks assessed/performed           Past Medical History:  Diagnosis Date  . Arthritis    hands  . Bladder outlet obstruction   . BPH (benign prostatic hyperplasia)   . Elevated PSA    per pt 2013  prostate bx negative for cancer  . Exposure to asbestos    per pt followed by a law firm in [place],  told last CT 2018 okay  . GERD (gastroesophageal reflux disease)   . History of bladder cancer    per pt 2011-- s/p  TURBT superficial per pt  . Hypertension   . Incomplete right bundle branch block   . Lower urinary tract symptoms (LUTS)     Past Surgical History:  Procedure Laterality Date  . APPENDECTOMY  age 20s  . INGUINAL HERNIA REPAIR Right 12/27/2015   Procedure: HERNIA REPAIR RIGHT INGUINAL ADULT WITH MESH;  Surgeon: [name], MD;  Location: [place];  Service: General;  Laterality: Right;  . INSERTION OF MESH Right 12/27/2015   Procedure: INSERTION OF MESH;  Surgeon: [name], MD;  Location: [facility];  Service: General;  Laterality: Right;  . TONSILLECTOMY  age 69  . TOTAL KNEE ARTHROPLASTY Left 07/14/2020   Procedure: TOTAL KNEE ARTHROPLASTY;  Surgeon: [name], MD;  Location: [facility];  Service:  Orthopedics;  Laterality: Left;  2.5 hrs  . TRANSURETHRAL RESECTION OF PROSTATE N/A 11/20/2017   Procedure: TRANSURETHRAL RESECTION OF THE PROSTATE (TURP);  Surgeon: [name], MD;  Location: [facility];  Service: Urology;  Laterality: N/A;  . ULNAR TUNNEL RELEASE Bilateral 2012;  2009  . WRIST SURGERY Left 2013   no hardware    There were no vitals filed for this visit.   Subjective Assessment - 08/18/20 1319    Subjective COVID-19 screen performed prior to patient entering clinic.  Walked too much and knee swelled and hurt a lot last night .  Better today.    Pertinent History HTN, OA, BPH, hernia repair, left wrist surgery, bilateral Ulnar nerve release surgery, left wrist surgery,incomplete right BBB, GERD.    How long can you walk comfortably? Around home mostly.    Currently in Pain? Yes    Pain Score 4     Pain Location Knee    Pain Orientation Left    Pain Descriptors / Indicators Aching;Throbbing    Pain Type Chronic pain                             [facility] Adult PT Treatment/Exercise - 08/18/20 0001      Exercises  Exercises Knee/Hip      Knee/Hip Exercises: Aerobic   Recumbent Bike Level 3 progressing to seat 5 over 15 minutes.      Knee/Hip Exercises: Machines for Strengthening   Cybex Knee Extension 10# x 3 minutes.    Cybex Knee Flexion 30# x 3 minutes.    Cybex Leg Press 2 plates x 3 minutes.      Modalities   Modalities Health visitor Stimulation Location Left knee.    Electrical Stimulation Action IFC    Electrical Stimulation Parameters 80-150 Hz x 15 minutes.    Electrical Stimulation Goals Pain      Vasopneumatic   Number Minutes Vasopneumatic  15 minutes    Vasopnuematic Location  --   RT knee.   Vasopneumatic Pressure Low      Manual Therapy   Manual Therapy Passive ROM    Passive ROM In supine:  Passive left knee extension stretching x 5 minutes.                        PT Long Term Goals - 08/09/20 1241      PT LONG TERM GOAL #1   Title Independent with a HEP.    Time 4    Period Weeks    Status New      PT LONG TERM GOAL #2   Title Full left active knee extension in order to normalize gait.    Time 4    Period Weeks    Status New      PT LONG TERM GOAL #3   Title Active knee flexion to 115 degrees+ so the patient can perform functional tasks and do so with pain not > 2-3/10.    Time 4    Period Weeks    Status New      PT LONG TERM GOAL #4   Title Increase knee strength to a solid 5/5 to provide good stability for accomplishment of functional activities.    Time 4    Period Weeks    Status New      PT LONG TERM GOAL #5   Title Perform a reciprocating stair gait with one railing with pain not > 2-3/10.    Time 4    Period Weeks    Status New                 Plan - 08/18/20 1532    Clinical Impression Statement Excellent progress with patient progressing to seat 4 on recumbent bike.  He did a great job on weight machines without complaint.  Steri-strips now off.  Incision appears to be healing well.    Personal Factors and Comorbidities Comorbidity 1;Comorbidity 2;Other    Comorbidities HTN, OA, BPH, hernia repair, left wrist surgery, bilateral Ulnar nerve release surgery left wrist surgery,incomplete right BBB, GERD.    Examination-Activity Limitations Other;Locomotion Level    Examination-Participation Restrictions Other    Stability/Clinical Decision Making Stable/Uncomplicated    Rehab Potential Excellent    PT Frequency 3x / week    PT Duration 4 weeks    PT Treatment/Interventions ADLs/Self Care Home Management;Cryotherapy;Electrical Stimulation;Moist Heat;Gait training;Stair training;Functional mobility training;Therapeutic activities;Therapeutic exercise;Neuromuscular re-education;Manual techniques;Patient/family education;Passive range of motion;Vasopneumatic Device    PT Next Visit  Plan Prone hang and STW/M to left hamstrings.    Consulted and Agree with Plan of Care Patient           Patient  will benefit from skilled therapeutic intervention in order to improve the following deficits and impairments:  Abnormal gait,Decreased activity tolerance,Decreased range of motion,Decreased strength,Increased edema,Pain  Visit Diagnosis: Chronic pain of left knee  Localized edema  Stiffness of left knee, not elsewhere classified  Muscle weakness (generalized)     Problem List Patient Active Problem List   Diagnosis Date Noted  . S/P total knee replacement 07/15/2020  . S/P TKR (total knee replacement) using cement 07/14/2020  . BPH with obstruction/lower urinary tract symptoms 11/20/2017  . S/P hernia repair 12/27/2015    Matisha Termine, [place] MPT 08/18/2020, 3:36 PM  [facility] [address] [place], [place], [address] Phone: [phone]   Fax:  [phone]  Name: Joshua Bowers MRN: [id number] Date of Birth: 1950-11-21

## 2020-08-19 ENCOUNTER — Encounter: Payer: Self-pay | Admitting: Physical Therapy

## 2020-08-19 ENCOUNTER — Ambulatory Visit: Payer: Medicare Other | Admitting: Physical Therapy

## 2020-08-19 ENCOUNTER — Other Ambulatory Visit: Payer: Self-pay

## 2020-08-19 DIAGNOSIS — M6281 Muscle weakness (generalized): Secondary | ICD-10-CM

## 2020-08-19 DIAGNOSIS — M25662 Stiffness of left knee, not elsewhere classified: Secondary | ICD-10-CM

## 2020-08-19 DIAGNOSIS — R600 Localized edema: Secondary | ICD-10-CM

## 2020-08-19 DIAGNOSIS — M25562 Pain in left knee: Secondary | ICD-10-CM

## 2020-08-19 DIAGNOSIS — G8929 Other chronic pain: Secondary | ICD-10-CM

## 2020-08-19 NOTE — Therapy (Signed)
[facility] [place], [place], [address] Phone: [phone]   Fax:  [phone]  Physical Therapy Treatment  Patient Details  Name: Joshua Bowers MRN: [id number] Date of Birth: 03/29/1951 Referring Provider (PT): [name] MD   Encounter Date: 08/19/2020   PT End of Session - 08/19/20 1416    Visit Number 6    Number of Visits 12    Date for PT Re-Evaluation 09/06/20    Authorization Type FOTO AT LEAST EVERY 5TH VISIT.  PROGRESS NOTE AT 10TH VISIT.  KX MODIFIER AFTER 15 VISITS.    PT Start Time 1300    PT Stop Time 1355    PT Time Calculation (min) 55 min    Activity Tolerance Patient tolerated treatment well    Behavior During Therapy WFL for tasks assessed/performed           Past Medical History:  Diagnosis Date  . Arthritis    hands  . Bladder outlet obstruction   . BPH (benign prostatic hyperplasia)   . Elevated PSA    per pt 2013  prostate bx negative for cancer  . Exposure to asbestos    per pt followed by a law firm in [place],  told last CT 2018 okay  . GERD (gastroesophageal reflux disease)   . History of bladder cancer    per pt 2011-- s/p  TURBT superficial per pt  . Hypertension   . Incomplete right bundle branch block   . Lower urinary tract symptoms (LUTS)     Past Surgical History:  Procedure Laterality Date  . APPENDECTOMY  age 70  . INGUINAL HERNIA REPAIR Right 12/27/2015   Procedure: HERNIA REPAIR RIGHT INGUINAL ADULT WITH MESH;  Surgeon: [name], MD;  Location: [facility];  Service: General;  Laterality: Right;  . INSERTION OF MESH Right 12/27/2015   Procedure: INSERTION OF MESH;  Surgeon: [name], MD;  Location: [facility];  Service: General;  Laterality: Right;  . TONSILLECTOMY  age 70  . TOTAL KNEE ARTHROPLASTY Left 07/14/2020   Procedure: TOTAL KNEE ARTHROPLASTY;  Surgeon: [name], MD;  Location: [facility];  Service:  Orthopedics;  Laterality: Left;  2.5 hrs  . TRANSURETHRAL RESECTION OF PROSTATE N/A 11/20/2017   Procedure: TRANSURETHRAL RESECTION OF THE PROSTATE (TURP);  Surgeon: [name], MD;  Location: [facility];  Service: Urology;  Laterality: N/A;  . ULNAR TUNNEL RELEASE Bilateral 2012;  2009  . WRIST SURGERY Left 2013   no hardware    There were no vitals filed for this visit.   Subjective Assessment - 08/19/20 1415    Subjective COVID-19 screen performed prior to patient entering clinic. Patient arrived with mild pain in L knee with some stiffness.    Pertinent History HTN, OA, BPH, hernia repair, left wrist surgery, bilateral Ulnar nerve release surgery, left wrist surgery,incomplete right BBB, GERD.    How long can you walk comfortably? Around home mostly.    Currently in Pain? Yes    Pain Score 2     Pain Location Knee    Pain Orientation Left    Pain Descriptors / Indicators Aching;Throbbing    Pain Type Chronic pain    Pain Onset More than a month ago    Pain Frequency Constant              [facility] PT Assessment - 08/19/20 0001      Assessment   Medical Diagnosis Left total  knee replacement.    Referring Provider (PT) [name] MD    Onset Date/Surgical Date 07/14/20                         [facility] Adult PT Treatment/Exercise - 08/19/20 0001      Exercises   Exercises Knee/Hip      Knee/Hip Exercises: Aerobic   Recumbent Bike Seat 5-3 x15 mins to improve flexion ROM      Knee/Hip Exercises: Prone   Prone Knee Hang 2 minutes      Modalities   Modalities Vasopneumatic      Vasopneumatic   Number Minutes Vasopneumatic  10 minutes    Vasopnuematic Location  Knee    Vasopneumatic Pressure Low    Vasopneumatic Temperature  34 for edema and pain relief      Manual Therapy   Manual Therapy Passive ROM;Joint mobilization;Soft tissue mobilization    Joint Mobilization left knee patella mobilization in all planes to improve ROM    Soft  tissue mobilization STW/M and IASTM to distal L hamstring, distal ITB, and L calf; scar massaging in supine    Passive ROM PROM into left knee extension with 5-10 second overpressure to improve ROM. Intermittent oscillations to decrease guarding                       PT Long Term Goals - 08/09/20 1241      PT LONG TERM GOAL #1   Title Independent with a HEP.    Time 4    Period Weeks    Status New      PT LONG TERM GOAL #2   Title Full left active knee extension in order to normalize gait.    Time 4    Period Weeks    Status New      PT LONG TERM GOAL #3   Title Active knee flexion to 115 degrees+ so the patient can perform functional tasks and do so with pain not > 2-3/10.    Time 4    Period Weeks    Status New      PT LONG TERM GOAL #4   Title Increase knee strength to a solid 5/5 to provide good stability for accomplishment of functional activities.    Time 4    Period Weeks    Status New      PT LONG TERM GOAL #5   Title Perform a reciprocating stair gait with one railing with pain not > 2-3/10.    Time 4    Period Weeks    Status New                 Plan - 08/19/20 1417    Clinical Impression Statement Patient arrived to physical therapy with ongoing left stiffness and pain. Patient progressed to seat 3 with minimal complaints. Patient responded well to prone hang with gentle overpressure and STW/M and IASTM to distal hamstrings and ITB. Patient still with limited knee extension but WFL with PROM with overpressure. No adverse affects upon removal of modalities.    Personal Factors and Comorbidities Comorbidity 1;Comorbidity 2;Other    Comorbidities HTN, OA, BPH, hernia repair, left wrist surgery, bilateral Ulnar nerve release surgery left wrist surgery,incomplete right BBB, GERD.    Examination-Activity Limitations Other;Locomotion Level    Examination-Participation Restrictions Other    Stability/Clinical Decision Making Stable/Uncomplicated     Clinical Decision Making Low    Rehab Potential  Excellent    PT Frequency 3x / week    PT Duration 4 weeks    PT Treatment/Interventions ADLs/Self Care Home Management;Cryotherapy;Electrical Stimulation;Moist Heat;Gait training;Stair training;Functional mobility training;Therapeutic activities;Therapeutic exercise;Neuromuscular re-education;Manual techniques;Patient/family education;Passive range of motion;Vasopneumatic Device    PT Next Visit Plan step downs; Prone hang and STW/M to left hamstrings IASTM as tolerable; vasopneumatic device for edema control    Consulted and Agree with Plan of Care Patient           Patient will benefit from skilled therapeutic intervention in order to improve the following deficits and impairments:  Abnormal gait,Decreased activity tolerance,Decreased range of motion,Decreased strength,Increased edema,Pain  Visit Diagnosis: Chronic pain of left knee  Localized edema  Stiffness of left knee, not elsewhere classified  Muscle weakness (generalized)     Problem List Patient Active Problem List   Diagnosis Date Noted  . S/P total knee replacement 07/15/2020  . S/P TKR (total knee replacement) using cement 07/14/2020  . BPH with obstruction/lower urinary tract symptoms 11/20/2017  . S/P hernia repair 12/27/2015    Gabriela Eves, PT, DPT  08/19/2020, 5:26 PM  [facility] [address] [place], [place], [address] Phone: [phone]   Fax:  [phone]  Name: JUSTEN FONDA MRN: [id number] Date of Birth: 1950/09/27

## 2020-08-23 ENCOUNTER — Other Ambulatory Visit: Payer: Self-pay

## 2020-08-23 ENCOUNTER — Ambulatory Visit: Payer: Medicare Other | Attending: Specialist | Admitting: Physical Therapy

## 2020-08-23 DIAGNOSIS — M25662 Stiffness of left knee, not elsewhere classified: Secondary | ICD-10-CM | POA: Diagnosis present

## 2020-08-23 DIAGNOSIS — G8929 Other chronic pain: Secondary | ICD-10-CM | POA: Insufficient documentation

## 2020-08-23 DIAGNOSIS — M6281 Muscle weakness (generalized): Secondary | ICD-10-CM | POA: Diagnosis present

## 2020-08-23 DIAGNOSIS — R600 Localized edema: Secondary | ICD-10-CM | POA: Insufficient documentation

## 2020-08-23 DIAGNOSIS — M25562 Pain in left knee: Secondary | ICD-10-CM | POA: Insufficient documentation

## 2020-08-23 NOTE — Therapy (Signed)
[facility] [place], [place], [address] Phone: [phone]   Fax:  [phone]  Physical Therapy Treatment  Patient Details  Name: Joshua Bowers MRN: [id number] Date of Birth: 15-Nov-1950 Referring Provider (PT): [name] MD   Encounter Date: 08/23/2020   PT End of Session - 08/23/20 1433    Visit Number 7    Number of Visits 12    Date for PT Re-Evaluation 09/06/20    Authorization Type FOTO AT LEAST EVERY 5TH VISIT.  PROGRESS NOTE AT 10TH VISIT.  KX MODIFIER AFTER 15 VISITS.    PT Start Time 0231    PT Stop Time 0312    PT Time Calculation (min) 41 min    Activity Tolerance Patient tolerated treatment well    Behavior During Therapy [facility] for tasks assessed/performed           Past Medical History:  Diagnosis Date  . Arthritis    hands  . Bladder outlet obstruction   . BPH (benign prostatic hyperplasia)   . Elevated PSA    per pt 2013  prostate bx negative for cancer  . Exposure to asbestos    per pt followed by a law firm in [place] ,  told last CT 2018 okay  . GERD (gastroesophageal reflux disease)   . History of bladder cancer    per pt 2011-- s/p  TURBT superficial per pt  . Hypertension   . Incomplete right bundle branch block   . Lower urinary tract symptoms (LUTS)     Past Surgical History:  Procedure Laterality Date  . APPENDECTOMY  age 76s  . INGUINAL HERNIA REPAIR Right 12/27/2015   Procedure: HERNIA REPAIR RIGHT INGUINAL ADULT WITH MESH;  Surgeon: [name], MD;  Location: [place];  Service: General;  Laterality: Right;  . INSERTION OF MESH Right 12/27/2015   Procedure: INSERTION OF MESH;  Surgeon: [name], MD;  Location: [place];  Service: General;  Laterality: Right;  . TONSILLECTOMY  age 70  . TOTAL KNEE ARTHROPLASTY Left 07/14/2020   Procedure: TOTAL KNEE ARTHROPLASTY;  Surgeon: [name], MD;  Location: [facility];  Service:  Orthopedics;  Laterality: Left;  2.5 hrs  . TRANSURETHRAL RESECTION OF PROSTATE N/A 11/20/2017   Procedure: TRANSURETHRAL RESECTION OF THE PROSTATE (TURP);  Surgeon: [name], MD;  Location: [facility];  Service: Urology;  Laterality: N/A;  . ULNAR TUNNEL RELEASE Bilateral 2012;  2009  . WRIST SURGERY Left 2013   no hardware    There were no vitals filed for this visit.   Subjective Assessment - 08/23/20 1432    Subjective COVID-19 screen performed prior to patient entering clinic. Patient reported doing well with some soreness and stiffness in knee    Pertinent History HTN, OA, BPH, hernia repair, left wrist surgery, bilateral Ulnar nerve release surgery, left wrist surgery,incomplete right BBB, GERD.    How long can you walk comfortably? Around home mostly.    Currently in Pain? Yes    Pain Score 2     Pain Location Knee    Pain Orientation Left    Pain Descriptors / Indicators Discomfort    Pain Type Chronic pain    Pain Onset More than a month ago    Pain Frequency Constant    Aggravating Factors  increased activity    Pain Relieving Factors at rest              [facility] PT  Assessment - 08/23/20 0001      ROM / Strength   AROM / PROM / Strength AROM;PROM      AROM   AROM Assessment Site Knee    Right/Left Knee Left    Left Knee Extension -10    Left Knee Flexion 110      PROM   PROM Assessment Site Knee    Right/Left Knee Left    Left Knee Extension -6    Left Knee Flexion 116                         [facility] Adult PT Treatment/Exercise - 08/23/20 0001      Knee/Hip Exercises: Aerobic   Recumbent Bike 54min L2 adjusted for ROM      Knee/Hip Exercises: Standing   Forward Step Up Left;2 sets;10 reps;Step Height: 6"    Rocker Board 2 minutes      Vasopneumatic   Number Minutes Vasopneumatic  10 minutes    Vasopnuematic Location  Knee    Vasopneumatic Pressure Medium    Vasopneumatic Temperature  34 for edema and pain relief       Manual Therapy   Manual Therapy Passive ROM    Joint Mobilization left knee patella mobilization in all planes to improve ROM    Passive ROM manual PROM for left knee flexion and ext with low load holds to improve mobility                       PT Long Term Goals - 08/23/20 1434      PT LONG TERM GOAL #1   Title Independent with a HEP.    Time 4    Period Weeks    Status On-going      PT LONG TERM GOAL #2   Title Full left active knee extension in order to normalize gait.    Baseline AROM -10 degrees 08/23/20    Time 4    Period Weeks    Status On-going      PT LONG TERM GOAL #3   Title Active knee flexion to 115 degrees+ so the patient can perform functional tasks and do so with pain not > 2-3/10.    Baseline AROM 110 degrees 08/23/20    Time 4    Period Weeks    Status On-going      PT LONG TERM GOAL #4   Title Increase knee strength to a solid 5/5 to provide good stability for accomplishment of functional activities.    Time 4    Period Weeks    Status On-going      PT LONG TERM GOAL #5   Title Perform a reciprocating stair gait with one railing with pain not > 2-3/10.    Time 4    Period Weeks    Status On-going                 Plan - 08/23/20 1504    Clinical Impression Statement Patient tolerated treatment well today. Patient doing well with ROM and improved with both left knee flexion and ext today. Patient doing self stretches daily. Patient understands RICE method. Patient progressing toward goals this week. Focused on ROM and strength progression today. MD F/U to dr. Tonita Cong    Personal Factors and Comorbidities Comorbidity 1;Comorbidity 2;Other    Comorbidities HTN, OA, BPH, hernia repair, left wrist surgery, bilateral Ulnar nerve release surgery left wrist surgery,incomplete right BBB, GERD.  Examination-Activity Limitations Other;Locomotion Level    Examination-Participation Restrictions Other    Stability/Clinical Decision Making  Stable/Uncomplicated    Rehab Potential Excellent    PT Frequency 3x / week    PT Duration 4 weeks    PT Treatment/Interventions ADLs/Self Care Home Management;Cryotherapy;Electrical Stimulation;Moist Heat;Gait training;Stair training;Functional mobility training;Therapeutic activities;Therapeutic exercise;Neuromuscular re-education;Manual techniques;Patient/family education;Passive range of motion;Vasopneumatic Device    PT Next Visit Plan step downs; Prone hang and STW/M to left hamstrings IASTM as tolerable; vasopneumatic device for edema control    Consulted and Agree with Plan of Care Patient           Patient will benefit from skilled therapeutic intervention in order to improve the following deficits and impairments:  Abnormal gait,Decreased activity tolerance,Decreased range of motion,Decreased strength,Increased edema,Pain  Visit Diagnosis: Chronic pain of left knee  Localized edema  Muscle weakness (generalized)  Stiffness of left knee, not elsewhere classified     Problem List Patient Active Problem List   Diagnosis Date Noted  . S/P total knee replacement 07/15/2020  . S/P TKR (total knee replacement) using cement 07/14/2020  . BPH with obstruction/lower urinary tract symptoms 11/20/2017  . S/P hernia repair 12/27/2015    Dishon Kehoe P, PTA 08/23/2020, 3:13 PM  [facility] [address] [place], [place], [address] Phone: [phone]   Fax:  [phone]  Name: Joshua Bowers MRN: [id number] Date of Birth: February 04, 1951

## 2020-08-26 ENCOUNTER — Other Ambulatory Visit: Payer: Self-pay

## 2020-08-26 ENCOUNTER — Ambulatory Visit: Payer: Medicare Other | Admitting: Physical Therapy

## 2020-08-26 DIAGNOSIS — M6281 Muscle weakness (generalized): Secondary | ICD-10-CM

## 2020-08-26 DIAGNOSIS — R600 Localized edema: Secondary | ICD-10-CM

## 2020-08-26 DIAGNOSIS — M25662 Stiffness of left knee, not elsewhere classified: Secondary | ICD-10-CM

## 2020-08-26 DIAGNOSIS — M25562 Pain in left knee: Secondary | ICD-10-CM | POA: Diagnosis not present

## 2020-08-26 DIAGNOSIS — G8929 Other chronic pain: Secondary | ICD-10-CM

## 2020-08-26 NOTE — Therapy (Signed)
[facility] [place], [place], [address] Phone: [phone]   Fax:  [phone]  Physical Therapy Treatment  Patient Details  Name: Joshua Bowers MRN: [id number] Date of Birth: Jun 03, 1950 Referring Provider (PT): [name] MD   Encounter Date: 08/26/2020   PT End of Session - 08/26/20 1447    Visit Number 8    Number of Visits 12    Date for PT Re-Evaluation 09/06/20    Authorization Type FOTO AT LEAST EVERY 5TH VISIT.  PROGRESS NOTE AT 10TH VISIT.  KX MODIFIER AFTER 15 VISITS.    PT Start Time 0231    PT Stop Time 0313    PT Time Calculation (min) 42 min    Activity Tolerance Patient tolerated treatment well    Behavior During Therapy [facility] for tasks assessed/performed           Past Medical History:  Diagnosis Date  . Arthritis    hands  . Bladder outlet obstruction   . BPH (benign prostatic hyperplasia)   . Elevated PSA    per pt 2013  prostate bx negative for cancer  . Exposure to asbestos    per pt followed by a law firm in [place],  told last CT 2018 okay  . GERD (gastroesophageal reflux disease)   . History of bladder cancer    per pt 2011-- s/p  TURBT superficial per pt  . Hypertension   . Incomplete right bundle branch block   . Lower urinary tract symptoms (LUTS)     Past Surgical History:  Procedure Laterality Date  . APPENDECTOMY  age 63s  . INGUINAL HERNIA REPAIR Right 12/27/2015   Procedure: HERNIA REPAIR RIGHT INGUINAL ADULT WITH MESH;  Surgeon: [name], MD;  Location: [place];  Service: General;  Laterality: Right;  . INSERTION OF MESH Right 12/27/2015   Procedure: INSERTION OF MESH;  Surgeon: [name], MD;  Location: [place];  Service: General;  Laterality: Right;  . TONSILLECTOMY  age 75  . TOTAL KNEE ARTHROPLASTY Left 07/14/2020   Procedure: TOTAL KNEE ARTHROPLASTY;  Surgeon: [name], MD;  Location: [facility];  Service:  Orthopedics;  Laterality: Left;  2.5 hrs  . TRANSURETHRAL RESECTION OF PROSTATE N/A 11/20/2017   Procedure: TRANSURETHRAL RESECTION OF THE PROSTATE (TURP);  Surgeon: [name], MD;  Location: [facility];  Service: Urology;  Laterality: N/A;  . ULNAR TUNNEL RELEASE Bilateral 2012;  2009  . WRIST SURGERY Left 2013   no hardware    There were no vitals filed for this visit.   Subjective Assessment - 08/26/20 1445    Subjective COVID-19 screen performed prior to patient entering clinic. Patient reported going to [place] and is doing well. Today some edema in knee upon arrival.    Pertinent History HTN, OA, BPH, hernia repair, left wrist surgery, bilateral Ulnar nerve release surgery, left wrist surgery,incomplete right BBB, GERD.    How long can you walk comfortably? Around home mostly.    Currently in Pain? Yes    Pain Score 2     Pain Location Knee    Pain Orientation Left    Pain Descriptors / Indicators Discomfort    Pain Type Chronic pain    Pain Onset More than a month ago    Pain Frequency Constant    Aggravating Factors  increased activity    Pain Relieving Factors rest  [facility] PT Assessment - 08/26/20 0001      AROM   AROM Assessment Site Knee    Right/Left Knee Left    Left Knee Flexion 110      PROM   PROM Assessment Site Knee    Right/Left Knee Left    Left Knee Flexion 116                         [facility] Adult PT Treatment/Exercise - 08/26/20 0001      Knee/Hip Exercises: Aerobic   Recumbent Bike 81min L2 adjusted for ROM      Knee/Hip Exercises: Standing   Forward Step Up Left;2 sets;10 reps;Step Height: 6"    Step Down Left;2 sets;10 reps;Step Height: 6"    Rocker Board 2 minutes      Vasopneumatic   Number Minutes Vasopneumatic  10 minutes    Vasopnuematic Location  Knee    Vasopneumatic Pressure Medium    Vasopneumatic Temperature  34 for edema and pain relief      Manual Therapy   Manual Therapy Passive ROM     Joint Mobilization left knee patella mobilization in all planes to improve ROM    Passive ROM manual PROM for left knee flexion and ext with low load holds to improve mobility                       PT Long Term Goals - 08/23/20 1434      PT LONG TERM GOAL #1   Title Independent with a HEP.    Time 4    Period Weeks    Status On-going      PT LONG TERM GOAL #2   Title Full left active knee extension in order to normalize gait.    Baseline AROM -10 degrees 08/23/20    Time 4    Period Weeks    Status On-going      PT LONG TERM GOAL #3   Title Active knee flexion to 115 degrees+ so the patient can perform functional tasks and do so with pain not > 2-3/10.    Baseline AROM 110 degrees 08/23/20    Time 4    Period Weeks    Status On-going      PT LONG TERM GOAL #4   Title Increase knee strength to a solid 5/5 to provide good stability for accomplishment of functional activities.    Time 4    Period Weeks    Status On-going      PT LONG TERM GOAL #5   Title Perform a reciprocating stair gait with one railing with pain not > 2-3/10.    Time 4    Period Weeks    Status On-going                 Plan - 08/26/20 1511    Clinical Impression Statement Patient tolerated treatment well today. Patient progressing with exercises and maintaining ROM in left knee. Patient doing well with self stretches. Goals progresisng this week.    Personal Factors and Comorbidities Comorbidity 1;Comorbidity 2;Other    Comorbidities HTN, OA, BPH, hernia repair, left wrist surgery, bilateral Ulnar nerve release surgery left wrist surgery,incomplete right BBB, GERD.    Examination-Activity Limitations Other;Locomotion Level    Examination-Participation Restrictions Other    Stability/Clinical Decision Making Stable/Uncomplicated    Rehab Potential Excellent    PT Frequency 3x / week    PT Duration 4  weeks    PT Treatment/Interventions ADLs/Self Care Home  Management;Cryotherapy;Electrical Stimulation;Moist Heat;Gait training;Stair training;Functional mobility training;Therapeutic activities;Therapeutic exercise;Neuromuscular re-education;Manual techniques;Patient/family education;Passive range of motion;Vasopneumatic Device    PT Next Visit Plan step downs; Prone hang and STW/M to left hamstrings IASTM as tolerable; vasopneumatic device for edema control    Consulted and Agree with Plan of Care Patient           Patient will benefit from skilled therapeutic intervention in order to improve the following deficits and impairments:  Abnormal gait,Decreased activity tolerance,Decreased range of motion,Decreased strength,Increased edema,Pain  Visit Diagnosis: Chronic pain of left knee  Localized edema  Muscle weakness (generalized)  Stiffness of left knee, not elsewhere classified     Problem List Patient Active Problem List   Diagnosis Date Noted  . S/P total knee replacement 07/15/2020  . S/P TKR (total knee replacement) using cement 07/14/2020  . BPH with obstruction/lower urinary tract symptoms 11/20/2017  . S/P hernia repair 12/27/2015    Joshua Bowers P, PTA 08/26/2020, 3:28 PM  [facility] [address] [place], [place], [address] Phone: [phone]   Fax:  [phone]  Name: Joshua Bowers MRN: [id number] Date of Birth: [birth year]

## 2020-08-30 ENCOUNTER — Other Ambulatory Visit: Payer: Self-pay

## 2020-08-30 ENCOUNTER — Ambulatory Visit: Payer: Medicare Other | Admitting: Physical Therapy

## 2020-08-30 DIAGNOSIS — M25562 Pain in left knee: Secondary | ICD-10-CM

## 2020-08-30 DIAGNOSIS — M25662 Stiffness of left knee, not elsewhere classified: Secondary | ICD-10-CM

## 2020-08-30 DIAGNOSIS — R600 Localized edema: Secondary | ICD-10-CM

## 2020-08-30 DIAGNOSIS — G8929 Other chronic pain: Secondary | ICD-10-CM

## 2020-08-30 DIAGNOSIS — M6281 Muscle weakness (generalized): Secondary | ICD-10-CM

## 2020-08-30 NOTE — Therapy (Signed)
[facility] [place], [place], [address] Phone: [phone]   Fax:  [phone]  Physical Therapy Treatment  Patient Details  Name: Joshua Bowers MRN: [id number] Date of Birth: 07/01/1950 Referring Provider (PT): [name] MD   Encounter Date: 08/30/2020   PT End of Session - 08/30/20 1348    Visit Number 9    Number of Visits 12    Date for PT Re-Evaluation 09/06/20    Authorization Type FOTO 9th visit score 76   PROGRESS NOTE AT 10TH VISIT.  KX MODIFIER AFTER 15 VISITS.    PT Start Time 0145    PT Stop Time 0229    PT Time Calculation (min) 44 min    Activity Tolerance Patient tolerated treatment well    Behavior During Therapy [facility] for tasks assessed/performed           Past Medical History:  Diagnosis Date  . Arthritis    hands  . Bladder outlet obstruction   . BPH (benign prostatic hyperplasia)   . Elevated PSA    per pt 2013  prostate bx negative for cancer  . Exposure to asbestos    per pt followed by a law firm in [place],  told last CT 2018 okay  . GERD (gastroesophageal reflux disease)   . History of bladder cancer    per pt 2011-- s/p  TURBT superficial per pt  . Hypertension   . Incomplete right bundle branch block   . Lower urinary tract symptoms (LUTS)     Past Surgical History:  Procedure Laterality Date  . APPENDECTOMY  age 59s  . INGUINAL HERNIA REPAIR Right 12/27/2015   Procedure: HERNIA REPAIR RIGHT INGUINAL ADULT WITH MESH;  Surgeon: [name], MD;  Location: [place];  Service: General;  Laterality: Right;  . INSERTION OF MESH Right 12/27/2015   Procedure: INSERTION OF MESH;  Surgeon: [name], MD;  Location: [place];  Service: General;  Laterality: Right;  . TONSILLECTOMY  age 56  . TOTAL KNEE ARTHROPLASTY Left 07/14/2020   Procedure: TOTAL KNEE ARTHROPLASTY;  Surgeon: [name], MD;  Location: [facility];  Service: Orthopedics;   Laterality: Left;  2.5 hrs  . TRANSURETHRAL RESECTION OF PROSTATE N/A 11/20/2017   Procedure: TRANSURETHRAL RESECTION OF THE PROSTATE (TURP);  Surgeon: [name], MD;  Location: [facility];  Service: Urology;  Laterality: N/A;  . ULNAR TUNNEL RELEASE Bilateral 2012;  2009  . WRIST SURGERY Left 2013   no hardware    There were no vitals filed for this visit.   Subjective Assessment - 08/30/20 1345    Subjective COVID-19 screen performed prior to patient entering clinic. Patient arrived doing well with some ongoing edema in knee    Pertinent History HTN, OA, BPH, hernia repair, left wrist surgery, bilateral Ulnar nerve release surgery, left wrist surgery,incomplete right BBB, GERD.    How long can you walk comfortably? Around home mostly.    Currently in Pain? Yes    Pain Score 2     Pain Location Knee    Pain Orientation Left    Pain Descriptors / Indicators Discomfort    Pain Type Chronic pain    Pain Onset More than a month ago    Pain Frequency Constant    Aggravating Factors  increased activity    Pain Relieving Factors at rest              [facility] PT Assessment -  08/30/20 0001      AROM   AROM Assessment Site Knee    Right/Left Knee Left    Left Knee Extension -5    Left Knee Flexion 111      PROM   PROM Assessment Site Knee    Right/Left Knee Left    Left Knee Extension -1    Left Knee Flexion 118                         [facility] Adult PT Treatment/Exercise - 08/30/20 0001      Knee/Hip Exercises: Aerobic   Recumbent Bike 60min L2 adjusted for ROM      Knee/Hip Exercises: Standing   Forward Step Up Left;2 sets;10 reps;Step Height: 6"    Step Down Left;2 sets;10 reps;Step Height: 6"    Rocker Board 2 minutes      Vasopneumatic   Number Minutes Vasopneumatic  10 minutes    Vasopnuematic Location  Knee    Vasopneumatic Pressure Medium    Vasopneumatic Temperature  34 for edema and pain relief      Manual Therapy   Manual  Therapy Passive ROM    Passive ROM manual PROM for left knee flexion and ext with low load holds to improve mobility                       PT Long Term Goals - 08/30/20 1346      PT LONG TERM GOAL #1   Title Independent with a HEP.    Time 4    Period Weeks    Status On-going      PT LONG TERM GOAL #2   Title Full left active knee extension in order to normalize gait.    Baseline AROM -5 degrees 08/30/20    Time 4    Period Weeks    Status On-going      PT LONG TERM GOAL #3   Title Active knee flexion to 115 degrees+ so the patient can perform functional tasks and do so with pain not > 2-3/10.    Baseline AROM 111 degrees 08/30/20    Time 4    Period Weeks    Status On-going      PT LONG TERM GOAL #4   Title Increase knee strength to a solid 5/5 to provide good stability for accomplishment of functional activities.    Time 4    Period Weeks    Status On-going      PT LONG TERM GOAL #5   Title Perform a reciprocating stair gait with one railing with pain not > 2-3/10.    Time 4    Period Weeks    Status On-going                 Plan - 08/30/20 1420    Clinical Impression Statement Patient tolerated treatment well today. Patient has improved with ROM for both flexion and especially ext today. Patient has reported doing prone hang daily to improve mobility. Patient doing well with left LE strengthening progresison. Today focused on ROM to improve mobility. Patient progressing toward goals today.    Personal Factors and Comorbidities Comorbidity 1;Comorbidity 2;Other    Comorbidities HTN, OA, BPH, hernia repair, left wrist surgery, bilateral Ulnar nerve release surgery left wrist surgery,incomplete right BBB, GERD.    Examination-Activity Limitations Other;Locomotion Level    Examination-Participation Restrictions Other    Stability/Clinical Decision Making Stable/Uncomplicated  Rehab Potential Excellent    PT Frequency 3x / week    PT Duration 4  weeks    PT Treatment/Interventions ADLs/Self Care Home Management;Cryotherapy;Electrical Stimulation;Moist Heat;Gait training;Stair training;Functional mobility training;Therapeutic activities;Therapeutic exercise;Neuromuscular re-education;Manual techniques;Patient/family education;Passive range of motion;Vasopneumatic Device    PT Next Visit Plan cont with ROM/PRE's / Prone hang and STW/M to left hamstrings IASTM as tolerable; vasopneumatic device for edema control    Consulted and Agree with Plan of Care Patient           Patient will benefit from skilled therapeutic intervention in order to improve the following deficits and impairments:  Abnormal gait,Decreased activity tolerance,Decreased range of motion,Decreased strength,Increased edema,Pain  Visit Diagnosis: Chronic pain of left knee  Localized edema  Muscle weakness (generalized)  Stiffness of left knee, not elsewhere classified     Problem List Patient Active Problem List   Diagnosis Date Noted  . S/P total knee replacement 07/15/2020  . S/P TKR (total knee replacement) using cement 07/14/2020  . BPH with obstruction/lower urinary tract symptoms 11/20/2017  . S/P hernia repair 12/27/2015    Sacoya Mcgourty P, PTA 08/30/2020, 2:31 PM  [facility] [place], [place], [address] Phone: [phone]   Fax:  [phone]  Name: ALADDIN KOLLMANN MRN: [id number] Date of Birth: 12/25/1950

## 2020-09-02 ENCOUNTER — Other Ambulatory Visit: Payer: Self-pay

## 2020-09-02 ENCOUNTER — Ambulatory Visit: Payer: Medicare Other | Admitting: Physical Therapy

## 2020-09-02 DIAGNOSIS — M25562 Pain in left knee: Secondary | ICD-10-CM | POA: Diagnosis not present

## 2020-09-02 DIAGNOSIS — M6281 Muscle weakness (generalized): Secondary | ICD-10-CM

## 2020-09-02 DIAGNOSIS — G8929 Other chronic pain: Secondary | ICD-10-CM

## 2020-09-02 DIAGNOSIS — R600 Localized edema: Secondary | ICD-10-CM

## 2020-09-02 DIAGNOSIS — M25662 Stiffness of left knee, not elsewhere classified: Secondary | ICD-10-CM

## 2020-09-02 NOTE — Therapy (Addendum)
[facility] [place], [place], [address] Phone: [phone]   Fax:  [phone]  Physical Therapy Treatment  Patient Details  Name: DYAN CREELMAN MRN: [id number] Date of Birth: 04-26-1951 Referring Provider (PT): [name] MD   Encounter Date: 09/02/2020   PT End of Session - 09/02/20 1418    Visit Number 10    Number of Visits 12    Date for PT Re-Evaluation 09/06/20    Authorization Type FOTO 9th visit score 59   PROGRESS NOTE AT 10TH VISIT.  KX MODIFIER AFTER 15 VISITS.    PT Start Time 0145    PT Stop Time 0226    PT Time Calculation (min) 41 min    Activity Tolerance Patient tolerated treatment well    Behavior During Therapy [facility] for tasks assessed/performed           Past Medical History:  Diagnosis Date  . Arthritis    hands  . Bladder outlet obstruction   . BPH (benign prostatic hyperplasia)   . Elevated PSA    per pt 2013  prostate bx negative for cancer  . Exposure to asbestos    per pt followed by a law firm in [place],  told last CT 2018 okay  . GERD (gastroesophageal reflux disease)   . History of bladder cancer    per pt 2011-- s/p  TURBT superficial per pt  . Hypertension   . Incomplete right bundle branch block   . Lower urinary tract symptoms (LUTS)     Past Surgical History:  Procedure Laterality Date  . APPENDECTOMY  age 70s  . INGUINAL HERNIA REPAIR Right 12/27/2015   Procedure: HERNIA REPAIR RIGHT INGUINAL ADULT WITH MESH;  Surgeon: [name], MD;  Location: [place];  Service: General;  Laterality: Right;  . INSERTION OF MESH Right 12/27/2015   Procedure: INSERTION OF MESH;  Surgeon: [name], MD;  Location: [place];  Service: General;  Laterality: Right;  . TONSILLECTOMY  age 70  . TOTAL KNEE ARTHROPLASTY Left 07/14/2020   Procedure: TOTAL KNEE ARTHROPLASTY;  Surgeon: [name], MD;  Location: [facility];  Service: Orthopedics;   Laterality: Left;  2.5 hrs  . TRANSURETHRAL RESECTION OF PROSTATE N/A 11/20/2017   Procedure: TRANSURETHRAL RESECTION OF THE PROSTATE (TURP);  Surgeon: [name], MD;  Location[facility];  Service: Urology;  Laterality: N/A;  . ULNAR TUNNEL RELEASE Bilateral 2012;  2009  . WRIST SURGERY Left 2013   no hardware    There were no vitals filed for this visit.   Subjective Assessment - 09/02/20 1355    Subjective COVID-19 screen performed prior to patient entering clinic. Patient arrived doing well today some increased pain yesterday after doing too much yard work    Pertinent History HTN, OA, BPH, hernia repair, left wrist surgery, bilateral Ulnar nerve release surgery, left wrist surgery,incomplete right BBB, GERD.    How long can you walk comfortably? Around home mostly.    Currently in Pain? Yes    Pain Score 2     Pain Location Knee    Pain Orientation Left    Pain Descriptors / Indicators Discomfort    Pain Type Chronic pain    Pain Onset More than a month ago    Pain Frequency Constant    Aggravating Factors  increased activity/ADL's    Pain Relieving Factors rest  [facility] Adult PT Treatment/Exercise - 09/02/20 0001      Knee/Hip Exercises: Aerobic   Recumbent Bike 20min L2 adjusted for ROM      Knee/Hip Exercises: Machines for Strengthening   Cybex Knee Extension 10# 2x10    Cybex Knee Flexion 30# x10      Knee/Hip Exercises: Standing   Rocker Board 2 minutes      Vasopneumatic   Number Minutes Vasopneumatic  10 minutes    Vasopnuematic Location  Knee    Vasopneumatic Pressure Medium    Vasopneumatic Temperature  34 for edema and pain relief      Manual Therapy   Manual Therapy Passive ROM    Passive ROM manual PROM for left knee flexion and ext with low load holds to improve mobility                       PT Long Term Goals - 08/30/20 1346      PT LONG TERM GOAL #1   Title Independent  with a HEP.    Time 4    Period Weeks    Status On-going      PT LONG TERM GOAL #2   Title Full left active knee extension in order to normalize gait.    Baseline AROM -5 degrees 08/30/20    Time 4    Period Weeks    Status On-going      PT LONG TERM GOAL #3   Title Active knee flexion to 115 degrees+ so the patient can perform functional tasks and do so with pain not > 2-3/10.    Baseline AROM 111 degrees 08/30/20    Time 4    Period Weeks    Status On-going      PT LONG TERM GOAL #4   Title Increase knee strength to a solid 5/5 to provide good stability for accomplishment of functional activities.    Time 4    Period Weeks    Status On-going      PT LONG TERM GOAL #5   Title Perform a reciprocating stair gait with one railing with pain not > 2-3/10.    Time 4    Period Weeks    Status On-going                 Plan - 09/02/20 1419    Clinical Impression Statement Patient tolerated treatment well today. Patient continues to improve with ROM in left knee. Patient has full PROM today with some active limitations. Patient able to progress with strengthening today yet slow progression due to painful if prolong activity. Patient goals progressing this week.    Personal Factors and Comorbidities Comorbidity 1;Comorbidity 2;Other    Comorbidities HTN, OA, BPH, hernia repair, left wrist surgery, bilateral Ulnar nerve release surgery left wrist surgery,incomplete right BBB, GERD.    Examination-Activity Limitations Other;Locomotion Level    Examination-Participation Restrictions Other    Stability/Clinical Decision Making Stable/Uncomplicated    Rehab Potential Excellent    PT Frequency 3x / week    PT Duration 4 weeks    PT Treatment/Interventions ADLs/Self Care Home Management;Cryotherapy;Electrical Stimulation;Moist Heat;Gait training;Stair training;Functional mobility training;Therapeutic activities;Therapeutic exercise;Neuromuscular re-education;Manual  techniques;Patient/family education;Passive range of motion;Vasopneumatic Device    PT Next Visit Plan cont with ROM/PRE's    Consulted and Agree with Plan of Care Patient           Patient will benefit from skilled therapeutic intervention in order to improve the following deficits and  impairments:  Abnormal gait,Decreased activity tolerance,Decreased range of motion,Decreased strength,Increased edema,Pain  Visit Diagnosis: Chronic pain of left knee  Localized edema  Muscle weakness (generalized)  Stiffness of left knee, not elsewhere classified     Problem List Patient Active Problem List   Diagnosis Date Noted  . S/P total knee replacement 07/15/2020  . S/P TKR (total knee replacement) using cement 07/14/2020  . BPH with obstruction/lower urinary tract symptoms 11/20/2017  . S/P hernia repair 12/27/2015    Randal Goens P, PTA 09/02/2020, 2:29 PM  [facility] [address] [place], [place], [address] Phone: [phone]   Fax:  [phone]  Name: THAYDEN LEMIRE MRN: [id number] Date of Birth: 28-Sep-1950  Progress Note Reporting Period 08/09/20 to 09/02/20  See note below for Objective Data and Assessment of Progress/Goals. Patient states the doctor was very pleased with his progress.  He s progressing nicely toward goals.    Mali Applegate MPT

## 2020-09-06 ENCOUNTER — Ambulatory Visit: Payer: Medicare Other

## 2020-09-06 ENCOUNTER — Other Ambulatory Visit: Payer: Self-pay

## 2020-09-06 DIAGNOSIS — M25662 Stiffness of left knee, not elsewhere classified: Secondary | ICD-10-CM

## 2020-09-06 DIAGNOSIS — R600 Localized edema: Secondary | ICD-10-CM

## 2020-09-06 DIAGNOSIS — M25562 Pain in left knee: Secondary | ICD-10-CM | POA: Diagnosis not present

## 2020-09-06 DIAGNOSIS — M6281 Muscle weakness (generalized): Secondary | ICD-10-CM

## 2020-09-06 DIAGNOSIS — G8929 Other chronic pain: Secondary | ICD-10-CM

## 2020-09-06 NOTE — Therapy (Signed)
[facility] [place], [place], [address] Phone: [phone]   Fax:  [phone]  Physical Therapy Treatment  Patient Details  Name: Joshua Bowers MRN: [id number] Date of Birth: 1951-02-01 Referring Provider (PT): [name] MD   Encounter Date: 09/06/2020   PT End of Session - 09/06/20 1352    Visit Number 11    Number of Visits 12    Date for PT Re-Evaluation 09/06/20    Authorization Type FOTO 9th visit score 22   PROGRESS NOTE AT 10TH VISIT.  KX MODIFIER AFTER 15 VISITS.    PT Start Time 1348    PT Stop Time 1438    PT Time Calculation (min) 50 min    Activity Tolerance Patient tolerated treatment well    Behavior During Therapy WFL for tasks assessed/performed           Past Medical History:  Diagnosis Date  . Arthritis    hands  . Bladder outlet obstruction   . BPH (benign prostatic hyperplasia)   . Elevated PSA    per pt 2013  prostate bx negative for cancer  . Exposure to asbestos    per pt followed by a law firm in [place],  told last CT 2018 okay  . GERD (gastroesophageal reflux disease)   . History of bladder cancer    per pt 2011-- s/p  TURBT superficial per pt  . Hypertension   . Incomplete right bundle branch block   . Lower urinary tract symptoms (LUTS)     Past Surgical History:  Procedure Laterality Date  . APPENDECTOMY  age 40s  . INGUINAL HERNIA REPAIR Right 12/27/2015   Procedure: HERNIA REPAIR RIGHT INGUINAL ADULT WITH MESH;  Surgeon: [name], MD;  Location: [facility];  Service: General;  Laterality: Right;  . INSERTION OF MESH Right 12/27/2015   Procedure: INSERTION OF MESH;  Surgeon: [name], MD;  Location: [facility];  Service: General;  Laterality: Right;  . TONSILLECTOMY  age 46  . TOTAL KNEE ARTHROPLASTY Left 07/14/2020   Procedure: TOTAL KNEE ARTHROPLASTY;  Surgeon: [name], MD;  Location: [facility];  Service: Orthopedics;   Laterality: Left;  2.5 hrs  . TRANSURETHRAL RESECTION OF PROSTATE N/A 11/20/2017   Procedure: TRANSURETHRAL RESECTION OF THE PROSTATE (TURP);  Surgeon: [name], MD;  Location: [facility];  Service: Urology;  Laterality: N/A;  . ULNAR TUNNEL RELEASE Bilateral 2012;  2009  . WRIST SURGERY Left 2013   no hardware    There were no vitals filed for this visit.   Subjective Assessment - 09/06/20 1351    Subjective COVID-19 screen performed prior to patient entering clinic. Pt stated no real pain, just stiffness in knee today.  Main problem with swelling.    Pertinent History HTN, OA, BPH, hernia repair, left wrist surgery, bilateral Ulnar nerve release surgery, left wrist surgery,incomplete right BBB, GERD.    Currently in Pain? No/denies              [facility] PT Assessment - 09/06/20 0001      Assessment   Medical Diagnosis Left total knee replacement.    Referring Provider (PT) [name] MD    Onset Date/Surgical Date 07/14/20      AROM   AROM Assessment Site Knee    Right/Left Knee Left    Left Knee Extension 5   lacking extension   Left Knee Flexion 117  PROM   Left Knee Extension -1    Left Knee Flexion 118      Strength   Strength Assessment Site Hip;Knee    Right/Left Hip Left    Left Hip Flexion 4+/5    Left Hip Extension 4/5    Left Hip ABduction 4+/5    Right/Left Knee Left    Left Knee Flexion 5/5    Left Knee Extension 4+/5                         [facility] Adult PT Treatment/Exercise - 09/06/20 0001      Exercises   Exercises Knee/Hip      Knee/Hip Exercises: Stretches   Active Hamstring Stretch 3 reps;30 seconds    Active Hamstring Stretch Limitations supine with hands behind knee      Knee/Hip Exercises: Aerobic   Recumbent Bike 8 min L2 adjusted for ROM      Knee/Hip Exercises: Machines for Strengthening   Cybex Knee Extension 10# 2x10    Cybex Knee Flexion 30#2 x10      Knee/Hip Exercises: Standing    Terminal Knee Extension 15 reps    Theraband Level (Terminal Knee Extension) Level 4 (Blue)    Terminal Knee Extension Limitations 5" holds      Knee/Hip Exercises: Supine   Short Arc [employer] 15 reps    Short Arc Quad Sets Limitations 2# 5" holds      Vasopneumatic   Number Minutes Vasopneumatic  10 minutes    Vasopnuematic Location  Knee    Vasopneumatic Pressure Medium    Vasopneumatic Temperature  34 for edema and pain relief      Manual Therapy   Manual Therapy Edema management    Manual therapy comments Manual complete separate than rest of tx    Edema Management Retrograde massage with LE elevated prior ROM based exercises    Passive ROM manual PROM for left knee flexion and ext with low load holds to improve mobility                       PT Long Term Goals - 09/06/20 1419      PT LONG TERM GOAL #1   Title Independent with a HEP.    Baseline 4/18:  Reports compliance with HEP daily    Status Achieved      PT LONG TERM GOAL #2   Title Full left active knee extension in order to normalize gait.    Baseline 4/18: 5-->117 degrees; was AROM -5 degrees 08/30/20;    Status On-going      PT LONG TERM GOAL #3   Title Active knee flexion to 115 degrees+ so the patient can perform functional tasks and do so with pain not > 2-3/10.    Baseline 09/06/20: 5-117 degrees, reports of tightness with end range flexion; AROM 111 degrees 08/30/20    Status On-going      PT LONG TERM GOAL #4   Title Increase knee strength to a solid 5/5 to provide good stability for accomplishment of functional activities.    Baseline 09/06/20 : see MMT    Status On-going      PT LONG TERM GOAL #5   Title Perform a reciprocating stair gait with one railing with pain not > 2-3/10.    Baseline 4/18: Not assessed this session    Status On-going  Plan - 09/06/20 1409    Clinical Impression Statement Began session with manual retrograde massage for edema control prioer  ROM based exercises.  Reviewed goals per end of cert with good progress noted.  Pt may continue to benefit from skilled therapy to address ROM deficits and strengthening as hip extension at 4/5.    Personal Factors and Comorbidities Comorbidity 1;Comorbidity 2;Other    Comorbidities HTN, OA, BPH, hernia repair, left wrist surgery, bilateral Ulnar nerve release surgery left wrist surgery,incomplete right BBB, GERD.    Examination-Activity Limitations Other;Locomotion Level    Examination-Participation Restrictions Other    Stability/Clinical Decision Making Stable/Uncomplicated    Clinical Decision Making Low    Rehab Potential Excellent    PT Frequency 3x / week    PT Duration 4 weeks    PT Treatment/Interventions ADLs/Self Care Home Management;Cryotherapy;Electrical Stimulation;Moist Heat;Gait training;Stair training;Functional mobility training;Therapeutic activities;Therapeutic exercise;Neuromuscular re-education;Manual techniques;Patient/family education;Passive range of motion;Vasopneumatic Device    PT Next Visit Plan cont with ROM/PRE's    Consulted and Agree with Plan of Care Patient           Patient will benefit from skilled therapeutic intervention in order to improve the following deficits and impairments:  Abnormal gait,Decreased activity tolerance,Decreased range of motion,Decreased strength,Increased edema,Pain  Visit Diagnosis: Chronic pain of left knee  Localized edema  Muscle weakness (generalized)  Stiffness of left knee, not elsewhere classified     Problem List Patient Active Problem List   Diagnosis Date Noted  . S/P total knee replacement 07/15/2020  . S/P TKR (total knee replacement) using cement 07/14/2020  . BPH with obstruction/lower urinary tract symptoms 11/20/2017  . S/P hernia repair 12/27/2015   Ihor Austin, LPTA/CLT; CBIS [phone]  Aldona Lento 09/06/2020, 4:31 PM  [facility] [place], [place], [address] Phone: [phone]   Fax:  [phone]  Name: Joshua Bowers MRN: [id number] Date of Birth: Nov 07, 1950

## 2020-09-09 ENCOUNTER — Ambulatory Visit: Payer: Medicare Other | Admitting: Physical Therapy

## 2020-09-09 ENCOUNTER — Other Ambulatory Visit: Payer: Self-pay

## 2020-09-09 ENCOUNTER — Encounter: Payer: Self-pay | Admitting: Physical Therapy

## 2020-09-09 DIAGNOSIS — G8929 Other chronic pain: Secondary | ICD-10-CM

## 2020-09-09 DIAGNOSIS — M25562 Pain in left knee: Secondary | ICD-10-CM | POA: Diagnosis not present

## 2020-09-09 DIAGNOSIS — M25662 Stiffness of left knee, not elsewhere classified: Secondary | ICD-10-CM

## 2020-09-09 DIAGNOSIS — R600 Localized edema: Secondary | ICD-10-CM

## 2020-09-09 DIAGNOSIS — M6281 Muscle weakness (generalized): Secondary | ICD-10-CM

## 2020-09-09 NOTE — Therapy (Signed)
[facility] [place], [place], [address] Phone: [phone]   Fax:  [phone]  Physical Therapy Treatment  Patient Details  Name: Joshua Bowers MRN: [id number] Date of Birth: 28-Jan-1951 Referring Provider (PT): [name] MD   Encounter Date: 09/09/2020   PT End of Session - 09/09/20 1351    Visit Number 12    Number of Visits 20    Date for PT Re-Evaluation 10/04/20   per NO signed by MD   Authorization Type FOTO 9th visit score 58   PROGRESS NOTE AT 10TH VISIT.  KX MODIFIER AFTER 15 VISITS.    PT Start Time 1351    PT Stop Time 1435    PT Time Calculation (min) 44 min    Activity Tolerance Patient tolerated treatment well    Behavior During Therapy WFL for tasks assessed/performed           Past Medical History:  Diagnosis Date  . Arthritis    hands  . Bladder outlet obstruction   . BPH (benign prostatic hyperplasia)   . Elevated PSA    per pt 2013  prostate bx negative for cancer  . Exposure to asbestos    per pt followed by a law firm in [place],  told last CT 2018 okay  . GERD (gastroesophageal reflux disease)   . History of bladder cancer    per pt 2011-- s/p  TURBT superficial per pt  . Hypertension   . Incomplete right bundle branch block   . Lower urinary tract symptoms (LUTS)     Past Surgical History:  Procedure Laterality Date  . APPENDECTOMY  age 70  . INGUINAL HERNIA REPAIR Right 12/27/2015   Procedure: HERNIA REPAIR RIGHT INGUINAL ADULT WITH MESH;  Surgeon: [name], MD;  Location: [facility];  Service: General;  Laterality: Right;  . INSERTION OF MESH Right 12/27/2015   Procedure: INSERTION OF MESH;  Surgeon: [name], MD;  Location: [facility];  Service: General;  Laterality: Right;  . TONSILLECTOMY  age 70  . TOTAL KNEE ARTHROPLASTY Left 07/14/2020   Procedure: TOTAL KNEE ARTHROPLASTY;  Surgeon: [name], MD;  Location: [facility];   Service: Orthopedics;  Laterality: Left;  2.5 hrs  . TRANSURETHRAL RESECTION OF PROSTATE N/A 11/20/2017   Procedure: TRANSURETHRAL RESECTION OF THE PROSTATE (TURP);  Surgeon: [name], MD;  Location: [facility];  Service: Urology;  Laterality: N/A;  . ULNAR TUNNEL RELEASE Bilateral 2012;  2009  . WRIST SURGERY Left 2013   no hardware    There were no vitals filed for this visit.   Subjective Assessment - 09/09/20 1350    Subjective COVID-19 screen performed prior to patient entering clinic. Patient reports less swelling today. Has been using an old cut off legging today to decrease sensitivity against clothing.    Pertinent History HTN, OA, BPH, hernia repair, left wrist surgery, bilateral Ulnar nerve release surgery, left wrist surgery,incomplete right BBB, GERD.    How long can you walk comfortably? Around home mostly.    Currently in Pain? No/denies              [facility] PT Assessment - 09/09/20 0001      Assessment   Medical Diagnosis Left total knee replacement.    Referring Provider (PT) [name] MD    Onset Date/Surgical Date 07/14/20    Next MD Visit 10/06/2020  [employer] Adult PT Treatment/Exercise - 09/09/20 0001      Knee/Hip Exercises: Aerobic   Recumbent Bike L4, seat 5 x12 min      Knee/Hip Exercises: Machines for Strengthening   Cybex Knee Extension 10# 3x10 reps    Cybex Knee Flexion 30#  3x10 reps    Cybex Leg Press 2.5 pl, seat 7 x20 reps      Knee/Hip Exercises: Standing   Lateral Step Up Left;2 sets;10 reps;Hand Hold: 2;Step Height: 4"    Step Down Left;2 sets;10 reps;Hand Hold: 2;Step Height: 4"      Modalities   Modalities Vasopneumatic      Vasopneumatic   Number Minutes Vasopneumatic  10 minutes    Vasopnuematic Location  Knee    Vasopneumatic Pressure Medium    Vasopneumatic Temperature  34 for edema and pain relief                       PT Long Term Goals - 09/06/20 1419       PT LONG TERM GOAL #1   Title Independent with a HEP.    Baseline 4/18:  Reports compliance with HEP daily    Status Achieved      PT LONG TERM GOAL #2   Title Full left active knee extension in order to normalize gait.    Baseline 4/18: 5-->117 degrees; was AROM -5 degrees 08/30/20;    Status On-going      PT LONG TERM GOAL #3   Title Active knee flexion to 115 degrees+ so the patient can perform functional tasks and do so with pain not > 2-3/10.    Baseline 09/06/20: 5-117 degrees, reports of tightness with end range flexion; AROM 111 degrees 08/30/20    Status On-going      PT LONG TERM GOAL #4   Title Increase knee strength to a solid 5/5 to provide good stability for accomplishment of functional activities.    Baseline 09/06/20 : see MMT    Status On-going      PT LONG TERM GOAL #5   Title Perform a reciprocating stair gait with one railing with pain not > 2-3/10.    Baseline 4/18: Not assessed this session    Status On-going                 Plan - 09/09/20 1427    Clinical Impression Statement Patient presented in clinic with minimal edema of L knee. Patient progressed through machine strengthening as well as standing eccentric strengthening with no complaints of pain. Patient's knee is still highly sensitive to touch of clothing and has been using a makeshift stocking over R knee to alleviate sensitivity. Normal modalities response noted following removal of the modality.    Personal Factors and Comorbidities Comorbidity 1;Comorbidity 2;Other    Comorbidities HTN, OA, BPH, hernia repair, left wrist surgery, bilateral Ulnar nerve release surgery left wrist surgery,incomplete right BBB, GERD.    Examination-Activity Limitations Other;Locomotion Level    Examination-Participation Restrictions Other           Patient will benefit from skilled therapeutic intervention in order to improve the following deficits and impairments:  Abnormal gait,Decreased activity  tolerance,Decreased range of motion,Decreased strength,Increased edema,Pain  Visit Diagnosis: Chronic pain of left knee  Localized edema  Muscle weakness (generalized)  Stiffness of left knee, not elsewhere classified     Problem List Patient Active Problem List   Diagnosis Date Noted  . S/P total knee replacement 07/15/2020  .  S/P TKR (total knee replacement) using cement 07/14/2020  . BPH with obstruction/lower urinary tract symptoms 11/20/2017  . S/P hernia repair 12/27/2015    Standley Brooking, PTA 09/09/2020, 2:46 PM  [facility] [address] [place], [place], [address] Phone: [phone]   Fax:  [phone]  Name: DEMARQUS JOCSON MRN: [id number] Date of Birth: 05-13-1951

## 2020-09-13 ENCOUNTER — Ambulatory Visit: Payer: Medicare Other | Admitting: Physical Therapy

## 2020-09-13 ENCOUNTER — Other Ambulatory Visit: Payer: Self-pay

## 2020-09-13 DIAGNOSIS — R600 Localized edema: Secondary | ICD-10-CM

## 2020-09-13 DIAGNOSIS — M6281 Muscle weakness (generalized): Secondary | ICD-10-CM

## 2020-09-13 DIAGNOSIS — M25562 Pain in left knee: Secondary | ICD-10-CM

## 2020-09-13 DIAGNOSIS — G8929 Other chronic pain: Secondary | ICD-10-CM

## 2020-09-13 DIAGNOSIS — M25662 Stiffness of left knee, not elsewhere classified: Secondary | ICD-10-CM

## 2020-09-13 NOTE — Therapy (Signed)
[facility] [place], [place], [address] Phone: [phone]   Fax:  [phone]  Physical Therapy Treatment  Patient Details  Name: Joshua Bowers MRN: [id number] Date of Birth: 23-May-1950 Referring Provider (PT): [name] MD   Encounter Date: 09/13/2020   PT End of Session - 09/13/20 1434    Visit Number 13    Number of Visits 20    Date for PT Re-Evaluation 10/04/20    Authorization Type FOTO 9th visit score 53   PROGRESS NOTE AT 10TH VISIT.  KX MODIFIER AFTER 15 VISITS.    PT Start Time 0231    PT Stop Time 0314    PT Time Calculation (min) 43 min    Activity Tolerance Patient tolerated treatment well    Behavior During Therapy [facility] for tasks assessed/performed           Past Medical History:  Diagnosis Date  . Arthritis    hands  . Bladder outlet obstruction   . BPH (benign prostatic hyperplasia)   . Elevated PSA    per pt 2013  prostate bx negative for cancer  . Exposure to asbestos    per pt followed by a law firm in [place],  told last CT 2018 okay  . GERD (gastroesophageal reflux disease)   . History of bladder cancer    per pt 2011-- s/p  TURBT superficial per pt  . Hypertension   . Incomplete right bundle branch block   . Lower urinary tract symptoms (LUTS)     Past Surgical History:  Procedure Laterality Date  . APPENDECTOMY  age 16s  . INGUINAL HERNIA REPAIR Right 12/27/2015   Procedure: HERNIA REPAIR RIGHT INGUINAL ADULT WITH MESH;  Surgeon: [name], MD;  Location: [facility];  Service: General;  Laterality: Right;  . INSERTION OF MESH Right 12/27/2015   Procedure: INSERTION OF MESH;  Surgeon: [name], MD;  Location: [place];  Service: General;  Laterality: Right;  . TONSILLECTOMY  age 83  . TOTAL KNEE ARTHROPLASTY Left 07/14/2020   Procedure: TOTAL KNEE ARTHROPLASTY;  Surgeon: [name], MD;  Location: [facility];  Service: Orthopedics;   Laterality: Left;  2.5 hrs  . TRANSURETHRAL RESECTION OF PROSTATE N/A 11/20/2017   Procedure: TRANSURETHRAL RESECTION OF THE PROSTATE (TURP);  Surgeon: [name], MD;  Location: [facility];  Service: Urology;  Laterality: N/A;  . ULNAR TUNNEL RELEASE Bilateral 2012;  2009  . WRIST SURGERY Left 2013   no hardware    There were no vitals filed for this visit.   Subjective Assessment - 09/13/20 1433    Subjective COVID-19 screen performed prior to patient entering clinic. Patient reports some ongoing swelling yet doing well today.    Pertinent History HTN, OA, BPH, hernia repair, left wrist surgery, bilateral Ulnar nerve release surgery, left wrist surgery,incomplete right BBB, GERD.    How long can you walk comfortably? Around home mostly.    Currently in Pain? No/denies              [facility] PT Assessment - 09/13/20 0001      AROM   AROM Assessment Site Knee    Right/Left Knee Left    Left Knee Extension -2    Left Knee Flexion 115      PROM   PROM Assessment Site Knee    Right/Left Knee Left    Left Knee Extension 0  [facility] Adult PT Treatment/Exercise - 09/13/20 0001      Knee/Hip Exercises: Aerobic   Recumbent Bike L4, seat 5 x10 min      Knee/Hip Exercises: Machines for Strengthening   Cybex Knee Extension 20# 3x10    Cybex Knee Flexion 30#  3x10 reps    Cybex Leg Press 2.5 pl, seat 7 x20 reps      Knee/Hip Exercises: Standing   Step Down Left;2 sets;10 reps;Hand Hold: 2;Step Height: 6"      Vasopneumatic   Number Minutes Vasopneumatic  10 minutes    Vasopnuematic Location  Knee    Vasopneumatic Pressure Medium    Vasopneumatic Temperature  34 for edema and pain relief      Manual Therapy   Manual Therapy Passive ROM    Passive ROM manual PROM for left knee flexion and ext with low load holds to improve mobility                       PT Long Term Goals - 09/13/20 1448      PT LONG TERM GOAL  #1   Title Independent with a HEP.    Time 4    Period Weeks    Status Achieved      PT LONG TERM GOAL #2   Title Full left active knee extension in order to normalize gait.    Baseline AROM -2 degrees 09/13/20    Time 4    Period Weeks    Status On-going      PT LONG TERM GOAL #3   Title Active knee flexion to 115 degrees+ so the patient can perform functional tasks and do so with pain not > 2-3/10.    Baseline AROM 115 degrees 09/13/20    Time 4    Period Weeks    Status Achieved      PT LONG TERM GOAL #4   Title Increase knee strength to a solid 5/5 to provide good stability for accomplishment of functional activities.    Baseline 09/06/20 : see MMT    Time 4    Period Weeks    Status On-going      PT LONG TERM GOAL #5   Title Perform a reciprocating stair gait with one railing with pain not > 2-3/10.    Baseline met 09/13/20    Time 4    Period Weeks    Status Achieved                 Plan - 09/13/20 1505    Clinical Impression Statement Patient tolerated treatment well today. Patient has continued to progress with ROM today for flexion and ext. Patient able to perfrom a reciprocating stair gait with no pain or difficulty. Patient has met LTG #3 and #5 today with remaining progressing.    Personal Factors and Comorbidities Comorbidity 1;Comorbidity 2;Other    Comorbidities HTN, OA, BPH, hernia repair, left wrist surgery, bilateral Ulnar nerve release surgery left wrist surgery,incomplete right BBB, GERD.    Examination-Activity Limitations Other;Locomotion Level    Examination-Participation Restrictions Other    Stability/Clinical Decision Making Stable/Uncomplicated    Rehab Potential Excellent    PT Frequency 3x / week    PT Duration 4 weeks    PT Treatment/Interventions ADLs/Self Care Home Management;Cryotherapy;Electrical Stimulation;Moist Heat;Gait training;Stair training;Functional mobility training;Therapeutic activities;Therapeutic exercise;Neuromuscular  re-education;Manual techniques;Patient/family education;Passive range of motion;Vasopneumatic Device    PT Next Visit Plan cont with ROM/PRE's    Consulted and Agree  with Plan of Care Patient           Patient will benefit from skilled therapeutic intervention in order to improve the following deficits and impairments:  Abnormal gait,Decreased activity tolerance,Decreased range of motion,Decreased strength,Increased edema,Pain  Visit Diagnosis: Chronic pain of left knee  Localized edema  Muscle weakness (generalized)  Stiffness of left knee, not elsewhere classified     Problem List Patient Active Problem List   Diagnosis Date Noted  . S/P total knee replacement 07/15/2020  . S/P TKR (total knee replacement) using cement 07/14/2020  . BPH with obstruction/lower urinary tract symptoms 11/20/2017  . S/P hernia repair 12/27/2015    Can Lucci P, PTA 09/13/2020, 3:15 PM  [facility] [address] [place], [place], [address] Phone: [phone]   Fax:  [phone]  Name: Joshua Bowers MRN: [id number] Date of Birth: 31-Dec-1950

## 2020-09-16 ENCOUNTER — Ambulatory Visit: Payer: Medicare Other | Admitting: Physical Therapy

## 2020-09-16 ENCOUNTER — Other Ambulatory Visit: Payer: Self-pay

## 2020-09-16 DIAGNOSIS — M25562 Pain in left knee: Secondary | ICD-10-CM

## 2020-09-16 DIAGNOSIS — G8929 Other chronic pain: Secondary | ICD-10-CM

## 2020-09-16 DIAGNOSIS — R600 Localized edema: Secondary | ICD-10-CM

## 2020-09-16 DIAGNOSIS — M25662 Stiffness of left knee, not elsewhere classified: Secondary | ICD-10-CM

## 2020-09-16 DIAGNOSIS — M6281 Muscle weakness (generalized): Secondary | ICD-10-CM

## 2020-09-16 NOTE — Therapy (Signed)
[facility] [place], [place], [address] Phone: [phone]   Fax:  [phone]  Physical Therapy Treatment  Patient Details  Name: Joshua Bowers MRN: [id number] Date of Birth: 1950/08/02 Referring Provider (PT): [name] MD   Encounter Date: 09/16/2020   PT End of Session - 09/16/20 1432    Visit Number 14    Number of Visits 20    Date for PT Re-Evaluation 10/04/20    Authorization Type FOTO 14th visit score 75   PROGRESS NOTE AT 10TH VISIT.  KX MODIFIER AFTER 15 VISITS.    PT Start Time 0230    PT Stop Time 0313    PT Time Calculation (min) 43 min    Activity Tolerance Patient tolerated treatment well    Behavior During Therapy [facility] for tasks assessed/performed           Past Medical History:  Diagnosis Date  . Arthritis    hands  . Bladder outlet obstruction   . BPH (benign prostatic hyperplasia)   . Elevated PSA    per pt 2013  prostate bx negative for cancer  . Exposure to asbestos    per pt followed by a law firm in [place],  told last CT 2018 okay  . GERD (gastroesophageal reflux disease)   . History of bladder cancer    per pt 2011-- s/p  TURBT superficial per pt  . Hypertension   . Incomplete right bundle branch block   . Lower urinary tract symptoms (LUTS)     Past Surgical History:  Procedure Laterality Date  . APPENDECTOMY  age 42s  . INGUINAL HERNIA REPAIR Right 12/27/2015   Procedure: HERNIA REPAIR RIGHT INGUINAL ADULT WITH MESH;  Surgeon: [name], MD;  Location: [place];  Service: General;  Laterality: Right;  . INSERTION OF MESH Right 12/27/2015   Procedure: INSERTION OF MESH;  Surgeon: [name], MD;  Location: [place];  Service: General;  Laterality: Right;  . TONSILLECTOMY  age 22  . TOTAL KNEE ARTHROPLASTY Left 07/14/2020   Procedure: TOTAL KNEE ARTHROPLASTY;  Surgeon: [name], MD;  [facility];  Service: Orthopedics;   Laterality: Left;  2.5 hrs  . TRANSURETHRAL RESECTION OF PROSTATE N/A 11/20/2017   Procedure: TRANSURETHRAL RESECTION OF THE PROSTATE (TURP);  Surgeon: [name], MD;  Location: [facility];  Service: Urology;  Laterality: N/A;  . ULNAR TUNNEL RELEASE Bilateral 2012;  2009  . WRIST SURGERY Left 2013   no hardware    There were no vitals filed for this visit.   Subjective Assessment - 09/16/20 1432    Subjective COVID-19 screen performed prior to patient entering clinic. Patient arrived doing well with ongoing swelling in knee    Pertinent History HTN, OA, BPH, hernia repair, left wrist surgery, bilateral Ulnar nerve release surgery, left wrist surgery,incomplete right BBB, GERD.    How long can you walk comfortably? Around home mostly.    Currently in Pain? No/denies                             [facility] Adult PT Treatment/Exercise - 09/16/20 0001      Knee/Hip Exercises: Aerobic   Recumbent Bike L4, seat 5 x10 min      Knee/Hip Exercises: Machines for Strengthening   Cybex Knee Extension 20# 4x10    Cybex Knee Flexion 30#  4x10 reps    Cybex  Leg Press 2.5 pl, seat 7 2x20 reps      Knee/Hip Exercises: Sidelying   Hip ABduction Strengthening;Left;20 reps      Vasopneumatic   Number Minutes Vasopneumatic  10 minutes    Vasopnuematic Location  Knee    Vasopneumatic Pressure Medium    Vasopneumatic Temperature  34 for edema and pain relief      Manual Therapy   Manual Therapy Passive ROM    Passive ROM manual PROM for left knee flexion and ext with low load holds to improve mobility                       PT Long Term Goals - 09/13/20 1448      PT LONG TERM GOAL #1   Title Independent with a HEP.    Time 4    Period Weeks    Status Achieved      PT LONG TERM GOAL #2   Title Full left active knee extension in order to normalize gait.    Baseline AROM -2 degrees 09/13/20    Time 4    Period Weeks    Status On-going      PT  LONG TERM GOAL #3   Title Active knee flexion to 115 degrees+ so the patient can perform functional tasks and do so with pain not > 2-3/10.    Baseline AROM 115 degrees 09/13/20    Time 4    Period Weeks    Status Achieved      PT LONG TERM GOAL #4   Title Increase knee strength to a solid 5/5 to provide good stability for accomplishment of functional activities.    Baseline 09/06/20 : see MMT    Time 4    Period Weeks    Status On-going      PT LONG TERM GOAL #5   Title Perform a reciprocating stair gait with one railing with pain not > 2-3/10.    Baseline met 09/13/20    Time 4    Period Weeks    Status Achieved                 Plan - 09/16/20 1450    Clinical Impression Statement Patient tolerated treatment well today. Patient continues to have some swelling in knee. Patient progressing with all LE strength pregression and ROM in left knee. Today increased reps with same weight and added hip abd to improve strength. Patient remaining goals progressing.    Personal Factors and Comorbidities Comorbidity 1;Comorbidity 2;Other    Comorbidities HTN, OA, BPH, hernia repair, left wrist surgery, bilateral Ulnar nerve release surgery left wrist surgery,incomplete right BBB, GERD.    Examination-Activity Limitations Other;Locomotion Level    Examination-Participation Restrictions Other    Stability/Clinical Decision Making Stable/Uncomplicated    Rehab Potential Excellent    PT Frequency 3x / week    PT Duration 4 weeks    PT Treatment/Interventions ADLs/Self Care Home Management;Cryotherapy;Electrical Stimulation;Moist Heat;Gait training;Stair training;Functional mobility training;Therapeutic activities;Therapeutic exercise;Neuromuscular re-education;Manual techniques;Patient/family education;Passive range of motion;Vasopneumatic Device    PT Next Visit Plan cont with ROM/PRE's    Consulted and Agree with Plan of Care Patient           Patient will benefit from skilled  therapeutic intervention in order to improve the following deficits and impairments:  Abnormal gait,Decreased activity tolerance,Decreased range of motion,Decreased strength,Increased edema,Pain  Visit Diagnosis: Chronic pain of left knee  Localized edema  Stiffness of left knee, not elsewhere  classified  Muscle weakness (generalized)     Problem List Patient Active Problem List   Diagnosis Date Noted  . S/P total knee replacement 07/15/2020  . S/P TKR (total knee replacement) using cement 07/14/2020  . BPH with obstruction/lower urinary tract symptoms 11/20/2017  . S/P hernia repair 12/27/2015    Binyamin Nelis P, PTA 09/16/2020, 3:15 PM  [facility] [address] [place], [place], [address] Phone: [phone]   Fax:  [phone]  Name: SAKIB NOGUEZ MRN: [id number] Date of Birth: 02-06-1951

## 2020-09-20 ENCOUNTER — Ambulatory Visit: Payer: Medicare Other | Attending: Specialist | Admitting: Physical Therapy

## 2020-09-20 ENCOUNTER — Other Ambulatory Visit: Payer: Self-pay

## 2020-09-20 DIAGNOSIS — M25562 Pain in left knee: Secondary | ICD-10-CM | POA: Insufficient documentation

## 2020-09-20 DIAGNOSIS — G8929 Other chronic pain: Secondary | ICD-10-CM | POA: Diagnosis present

## 2020-09-20 DIAGNOSIS — R600 Localized edema: Secondary | ICD-10-CM | POA: Insufficient documentation

## 2020-09-20 DIAGNOSIS — M6281 Muscle weakness (generalized): Secondary | ICD-10-CM | POA: Insufficient documentation

## 2020-09-20 DIAGNOSIS — M25662 Stiffness of left knee, not elsewhere classified: Secondary | ICD-10-CM | POA: Insufficient documentation

## 2020-09-20 NOTE — Therapy (Addendum)
[facility] [place], [place], [address] Phone: [phone]   Fax:  [phone]  Physical Therapy Treatment  Patient Details  Name: Joshua Bowers MRN: [id number] Date of Birth: 1951-03-29 Referring Provider (PT): [name] MD   Encounter Date: 09/20/2020   PT End of Session - 09/20/20 1446     Visit Number 15    Number of Visits 20    Date for PT Re-Evaluation 10/04/20    Authorization Type FOTO 14th visit score 48   PROGRESS NOTE AT 10TH VISIT.  KX MODIFIER AFTER 15 VISITS.    PT Start Time 0237    PT Stop Time 0313    PT Time Calculation (min) 36 min    Activity Tolerance Patient tolerated treatment well    Behavior During Therapy [facility] for tasks assessed/performed             Past Medical History:  Diagnosis Date   Arthritis    hands   Bladder outlet obstruction    BPH (benign prostatic hyperplasia)    Elevated PSA    per pt 2013  prostate bx negative for cancer   Exposure to asbestos    per pt followed by a law firm in [place],  told last CT 2018 okay   GERD (gastroesophageal reflux disease)    History of bladder cancer    per pt 2011-- s/p  TURBT superficial per pt   Hypertension    Incomplete right bundle branch block    Lower urinary tract symptoms (LUTS)     Past Surgical History:  Procedure Laterality Date   APPENDECTOMY  age 70s   [place] Right 12/27/2015   Procedure: HERNIA REPAIR RIGHT INGUINAL ADULT WITH MESH;  Surgeon: [name], MD;  Location: [place];  Service: General;  Laterality: Right;   INSERTION OF MESH Right 12/27/2015   Procedure: INSERTION OF MESH;  Surgeon: [name], MD;  Location: [place];  Service: General;  Laterality: Right;   TONSILLECTOMY  age 70   TOTAL KNEE ARTHROPLASTY Left 07/14/2020   Procedure: TOTAL KNEE ARTHROPLASTY;  Surgeon: [name], MD;  [facility];  Service: Orthopedics;   Laterality: Left;  2.5 hrs   TRANSURETHRAL RESECTION OF PROSTATE N/A 11/20/2017   Procedure: TRANSURETHRAL RESECTION OF THE PROSTATE (TURP);  Surgeon: [name], MD;  Location: [facility];  Service: Urology;  Laterality: N/A;   ULNAR TUNNEL RELEASE Bilateral 2012;  2009   WRIST SURGERY Left 2013   no hardware    There were no vitals filed for this visit.   Subjective Assessment - 09/20/20 1437     Subjective COVID-19 screen performed prior to patient entering clinic. Patient reported doing well today. Today will be last day and on hold.    Pertinent History HTN, OA, BPH, hernia repair, left wrist surgery, bilateral Ulnar nerve release surgery, left wrist surgery,incomplete right BBB, GERD.    How long can you walk comfortably? Around home mostly.    Currently in Pain? No/denies                [facility] PT Assessment - 09/20/20 0001       AROM   AROM Assessment Site Knee    Right/Left Knee Left    Left Knee Extension -2    Left Knee Flexion 115      PROM   Left Knee Extension 0      Strength   Strength  Assessment Site Hip;Knee    Right/Left Hip Left    Left Hip Flexion 5/5    Left Hip Extension 5/5    Left Hip ABduction 5/5    Left Knee Flexion 5/5    Left Knee Extension 5/5                           [facility] Adult PT Treatment/Exercise - 09/20/20 0001       Knee/Hip Exercises: Aerobic   Recumbent Bike L4, seat 5 x10 min      Knee/Hip Exercises: Machines for Strengthening   Cybex Knee Extension 20# 4x10    Cybex Knee Flexion 30#  4x10 reps    Cybex Leg Press 2.5 pl, seat 7 2x20 reps      Vasopneumatic   Number Minutes Vasopneumatic  10 minutes    Vasopnuematic Location  Knee    Vasopneumatic Pressure Medium    Vasopneumatic Temperature  34 for edema and pain relief      Manual Therapy   Manual Therapy Passive ROM    Manual therapy comments Manual complete separate than rest of tx    Passive ROM manual PROM for left knee  flexion and ext with low load holds to improve mobility   5 min                        PT Long Term Goals - 09/20/20 1439       PT LONG TERM GOAL #1   Title Independent with a HEP.    Time 4    Period Weeks    Status Achieved      PT LONG TERM GOAL #2   Title Full left active knee extension in order to normalize gait.    Baseline AROM -2 degrees 5/2//22    Time 4    Period Weeks    Status Not Met      PT LONG TERM GOAL #3   Title Active knee flexion to 115 degrees+ so the patient can perform functional tasks and do so with pain not > 2-3/10.    Baseline AROM 115 degrees 09/13/20    Time 4    Period Weeks    Status Achieved      PT LONG TERM GOAL #4   Title Increase knee strength to a solid 5/5 to provide good stability for accomplishment of functional activities.    Baseline 09/20/20    Time 4    Period Weeks    Status Achieved      PT LONG TERM GOAL #5   Title Perform a reciprocating stair gait with one railing with pain not > 2-3/10.    Baseline met 09/13/20    Time 4    Period Weeks    Status Achieved                   Plan - 09/20/20 1441     Clinical Impression Statement Patient tolerated treatment well today. Patient has overall improved LE strength and ROM. Patient continues to have swelling in knee some days and manages it with ice, elevation and rest. Patient met all current goals except ext by 2 degrees. Patient to be on hold to DC. Patient independent with self stretches, ADL's and exercises.    Personal Factors and Comorbidities Comorbidity 1;Comorbidity 2;Other    Comorbidities HTN, OA, BPH, hernia repair, left wrist surgery, bilateral Ulnar nerve release surgery left wrist  surgery,incomplete right BBB, GERD.    Examination-Activity Limitations Other;Locomotion Level    Examination-Participation Restrictions Other    Stability/Clinical Decision Making Stable/Uncomplicated    Rehab Potential Excellent    PT Frequency 3x / week    PT  Duration 4 weeks    PT Treatment/Interventions ADLs/Self Care Home Management;Cryotherapy;Electrical Stimulation;Moist Heat;Gait training;Stair training;Functional mobility training;Therapeutic activities;Therapeutic exercise;Neuromuscular re-education;Manual techniques;Patient/family education;Passive range of motion;Vasopneumatic Device    PT Next Visit Plan on hold to DC    Consulted and Agree with Plan of Care Patient             Patient will benefit from skilled therapeutic intervention in order to improve the following deficits and impairments:  Abnormal gait,Decreased activity tolerance,Decreased range of motion,Decreased strength,Increased edema,Pain  Visit Diagnosis: Chronic pain of left knee  Stiffness of left knee, not elsewhere classified  Localized edema  Muscle weakness (generalized)     Problem List Patient Active Problem List   Diagnosis Date Noted   S/P total knee replacement 07/15/2020   S/P TKR (total knee replacement) using cement 07/14/2020   BPH with obstruction/lower urinary tract symptoms 11/20/2017   S/P hernia repair 12/27/2015    Afreen Siebels P, PTA 09/20/2020, 3:13 PM  [facility] [address] [place], [place], [address] Phone: [phone]   Fax:  [phone]  Name: SHANTANU STRAUCH MRN: [id number] Date of Birth: March 23, 1951  PHYSICAL THERAPY DISCHARGE SUMMARY  Visits from Start of Care: 15.  Current functional level related to goals / functional outcomes: See above.   Remaining deficits: See goal section.   Education / Equipment: HEP.   Patient agrees to discharge. Patient goals were partially met. Patient is being discharged due to being pleased with the current functional level.    Mali Applegate MPT

## 2021-07-25 ENCOUNTER — Ambulatory Visit: Payer: Self-pay | Admitting: Orthopedic Surgery

## 2021-07-25 DIAGNOSIS — M1711 Unilateral primary osteoarthritis, right knee: Secondary | ICD-10-CM

## 2021-07-25 DIAGNOSIS — M1909 Primary osteoarthritis, other specified site: Secondary | ICD-10-CM

## 2021-07-25 DIAGNOSIS — M79604 Pain in right leg: Secondary | ICD-10-CM

## 2021-08-09 NOTE — Progress Notes (Signed)
DUE TO COVID-19 ONLY ONE VISITOR IS ALLOWED TO COME WITH YOU AND STAY IN THE WAITING ROOM ONLY DURING PRE OP AND PROCEDURE DAY OF SURGERY.  2 VISITOR  MAY VISIT WITH YOU AFTER SURGERY IN YOUR PRIVATE ROOM DURING VISITING HOURS ONLY! ?YOU MAY HAVE ONE PERSON SPEND THE NITE WITH YOU IN YOUR ROOM AFTER SURGERY.   ? ? ? ? Your procedure is scheduled on:  ?     08/24/2021.  ? Report to [facility] Main  Entrance ? ? Report to admitting at     0600           AM ?DO NOT BRING INSURANCE CARD, PICTURE ID OR WALLET DAY OF SURGERY.  ?  ? ? Call this number if you have problems the morning of surgery [phone]  ? ? REMEMBER: NO  SOLID FOODS , CANDY, GUM OR MINTS AFTER MIDNITE THE NITE BEFORE SURGERY .       Marland Kitchen CLEAR LIQUIDS UNTIL    0530am             DAY OF SURGERY.      PLEASE FINISH ENSURE DRINK PER SURGEON ORDER  WHICH NEEDS TO BE COMPLETED AT   0530am       MORNING OF SURGERY.   ? ? ? ? ?CLEAR LIQUID DIET ? ? ?Foods Allowed      ?WATER ?BLACK COFFEE ( SUGAR OK, NO MILK, CREAM OR CREAMER) REGULAR AND DECAF  ?TEA ( SUGAR OK NO MILK, CREAM, OR CREAMER) REGULAR AND DECAF  ?PLAIN JELLO ( NO RED)  ?FRUIT ICES ( NO RED, NO FRUIT PULP)  ?POPSICLES ( NO RED)  ?JUICE- APPLE, WHITE GRAPE AND WHITE CRANBERRY  ?SPORT DRINK LIKE GATORADE ( NO RED)  ?CLEAR BROTH ( VEGETABLE , CHICKEN OR BEEF)                                                               ? ?    ? ?BRUSH YOUR TEETH MORNING OF SURGERY AND RINSE YOUR MOUTH OUT, NO CHEWING GUM CANDY OR MINTS. ?  ? ? Take these medicines the morning of surgery with A SIP OF WATER:  amlodipine  ? ? ?DO NOT TAKE ANY DIABETIC MEDICATIONS DAY OF YOUR SURGERY ?                  ?            You may not have any metal on your body including hair pins and  ?            piercings  Do not wear jewelry, make-up, lotions, powders or perfumes, deodorant ?            Do not wear nail polish on your fingernails.   ?           IF YOU ARE A MALE AND WANT TO SHAVE UNDER ARMS OR LEGS PRIOR TO SURGERY  YOU MUST DO SO AT LEAST 48 HOURS PRIOR TO SURGERY.  ?            Men may shave face and neck. ? ? Do not bring valuables to the hospital. [facility] NOT ?            RESPONSIBLE  FOR VALUABLES. ? Contacts, dentures or bridgework may not be worn into surgery. ? Leave suitcase in the car. After surgery it may be brought to your room. ? ?  ? Patients discharged the day of surgery will not be allowed to drive home. IF YOU ARE HAVING SURGERY AND GOING HOME THE SAME DAY, YOU MUST HAVE AN ADULT TO DRIVE YOU HOME AND BE WITH YOU FOR 24 HOURS. YOU MAY GO HOME BY TAXI OR UBER OR ORTHERWISE, BUT AN ADULT MUST ACCOMPANY YOU HOME AND STAY WITH YOU FOR 24 HOURS. ?  ? ?            Please read over the following fact sheets you were given: ?_____________________________________________________________________ ? ?[facility] - Preparing for Surgery ?Before surgery, you can play an important role.  Because skin is not sterile, your skin needs to be as free of germs as possible.  You can reduce the number of germs on your skin by washing with CHG (chlorahexidine gluconate) soap before surgery.  CHG is an antiseptic cleaner which kills germs and bonds with the skin to continue killing germs even after washing. ?Please DO NOT use if you have an allergy to CHG or antibacterial soaps.  If your skin becomes reddened/irritated stop using the CHG and inform your nurse when you arrive at Short Stay. ?Do not shave (including legs and underarms) for at least 48 hours prior to the first CHG shower.  You may shave your face/neck. ?Please follow these instructions carefully: ? 1.  Shower with CHG Soap the night before surgery and the  morning of Surgery. ? 2.  If you choose to wash your hair, wash your hair first as usual with your  normal  shampoo. ? 3.  After you shampoo, rinse your hair and body thoroughly to remove the  shampoo.                           4.  Use CHG as you would any other liquid soap.  You can apply chg directly  to the  skin and wash  ?                     Gently with a scrungie or clean washcloth. ? 5.  Apply the CHG Soap to your body ONLY FROM THE NECK DOWN.   Do not use on face/ open      ?                     Wound or open sores. Avoid contact with eyes, ears mouth and genitals (private parts).  ?                     Production manager,  Genitals (private parts) with your normal soap. ?            6.  Wash thoroughly, paying special attention to the area where your surgery  will be performed. ? 7.  Thoroughly rinse your body with warm water from the neck down. ? 8.  DO NOT shower/wash with your normal soap after using and rinsing off  the CHG Soap. ?               9.  Pat yourself dry with a clean towel. ?           10.  Wear clean pajamas. ?  11.  Place clean sheets on your bed the night of your first shower and do not  sleep with pets. ?Day of Surgery : ?Do not apply any lotions/deodorants the morning of surgery.  Please wear clean clothes to the hospital/surgery center. ? ?FAILURE TO FOLLOW THESE INSTRUCTIONS MAY RESULT IN THE CANCELLATION OF YOUR SURGERY ?PATIENT SIGNATURE_________________________________ ? ?NURSE SIGNATURE__________________________________ ? ?________________________________________________________________________  ? ? ?           ?

## 2021-08-09 NOTE — Progress Notes (Addendum)
Anesthesia Review: ? ?PCP: DR Monico Blitz in [place]- Called and requested L:OV note, labs and clearance.  LVMM LOV 07/13/21 on chart  ?Cardiologist : none  ?Chest x-ray : ?EKG : 08/12/21  ?Echo : ?Stress test: ?Cardiac Cath :  ?Activity level: can do a flgiht of stairs without difficulty   ?Sleep Study/ CPAP : none  ?Fasting Blood Sugar :      / Checks Blood Sugar -- times a day:   ?Blood Thinner/ Instructions /Last Dose: ?ASA / Instructions/ Last Dose :   ?81 mg aspirin  ?Cbc DONE 08/12/21 routed to DR Beane.- HGB-17.7 Armando Reichert aware.  ?

## 2021-08-12 ENCOUNTER — Encounter (HOSPITAL_COMMUNITY): Payer: Self-pay

## 2021-08-12 ENCOUNTER — Other Ambulatory Visit: Payer: Self-pay

## 2021-08-12 ENCOUNTER — Encounter (HOSPITAL_COMMUNITY)
Admission: RE | Admit: 2021-08-12 | Discharge: 2021-08-12 | Disposition: A | Discharge status: Home / Self Care | Payer: Medicare Other | Source: Ambulatory Visit | Attending: Specialist | Admitting: Specialist

## 2021-08-12 VITALS — BP 159/99 | HR 78 | Temp 98.5°F | Resp 16 | Ht 71.5 in | Wt 189.0 lb

## 2021-08-12 DIAGNOSIS — M7989 Other specified soft tissue disorders: Secondary | ICD-10-CM | POA: Insufficient documentation

## 2021-08-12 DIAGNOSIS — Z01818 Encounter for other preprocedural examination: Secondary | ICD-10-CM | POA: Diagnosis present

## 2021-08-12 DIAGNOSIS — M79604 Pain in right leg: Secondary | ICD-10-CM | POA: Insufficient documentation

## 2021-08-12 DIAGNOSIS — M1711 Unilateral primary osteoarthritis, right knee: Secondary | ICD-10-CM | POA: Insufficient documentation

## 2021-08-12 DIAGNOSIS — M1909 Primary osteoarthritis, other specified site: Secondary | ICD-10-CM | POA: Diagnosis not present

## 2021-08-12 LAB — URINALYSIS, ROUTINE W REFLEX MICROSCOPIC
Bilirubin Urine: NEGATIVE
Glucose, UA: NEGATIVE mg/dL
Hgb urine dipstick: NEGATIVE
Ketones, ur: NEGATIVE mg/dL
Leukocytes,Ua: NEGATIVE
Nitrite: NEGATIVE
Protein, ur: NEGATIVE mg/dL
Specific Gravity, Urine: 1.02 (ref 1.005–1.030)
pH: 6 (ref 5.0–8.0)

## 2021-08-12 LAB — PROTIME-INR
INR: 1 (ref 0.8–1.2)
Prothrombin Time: 13.3 seconds (ref 11.4–15.2)

## 2021-08-12 LAB — CBC
HCT: 52.9 % — ABNORMAL HIGH (ref 39.0–52.0)
Hemoglobin: 17.7 g/dL — ABNORMAL HIGH (ref 13.0–17.0)
MCH: 30.3 pg (ref 26.0–34.0)
MCHC: 33.5 g/dL (ref 30.0–36.0)
MCV: 90.6 fL (ref 80.0–100.0)
Platelets: 339 10*3/uL (ref 150–400)
RBC: 5.84 MIL/uL — ABNORMAL HIGH (ref 4.22–5.81)
RDW: 13.8 % (ref 11.5–15.5)
WBC: 7.4 10*3/uL (ref 4.0–10.5)
nRBC: 0 % (ref 0.0–0.2)

## 2021-08-12 LAB — APTT: aPTT: 30 seconds (ref 24–36)

## 2021-08-12 LAB — BASIC METABOLIC PANEL
Anion gap: 10 (ref 5–15)
BUN: 22 mg/dL (ref 8–23)
CO2: 26 mmol/L (ref 22–32)
Calcium: 9.4 mg/dL (ref 8.9–10.3)
Chloride: 102 mmol/L (ref 98–111)
Creatinine, Ser: 1.39 mg/dL — ABNORMAL HIGH (ref 0.61–1.24)
GFR, Estimated: 55 mL/min — ABNORMAL LOW (ref >60)
Glucose, Bld: 85 mg/dL (ref 70–99)
Potassium: 3.7 mmol/L (ref 3.5–5.1)
Sodium: 138 mmol/L (ref 135–145)

## 2021-08-12 LAB — SURGICAL PCR SCREEN
MRSA, PCR: NEGATIVE
Staphylococcus aureus: POSITIVE — AB

## 2021-08-17 ENCOUNTER — Ambulatory Visit: Payer: Self-pay | Admitting: Orthopedic Surgery

## 2021-08-17 NOTE — H&P (Signed)
Joshua Bowers is an 71 y.o. male.   ?Chief Complaint: Right knee pain ?HPI: Patient is here for his H&P. The patient is scheduled for a right total knee replacement by Dr. Tonita Cong at [facility] on 08/24/21. ? ?He has failed conservative treatment and desires to proceed with total knee replacement. Pain is interfering with quality of life and activities of daily living at this point and he has exhausted conservative treatment. ? ?Dr. Tonita Cong and the patient mutually agreed to proceed with a total knee replacement. Risks and benefits of the procedure were discussed including stiffness, suboptimal range of motion, persistent pain, infection requiring removal of prosthesis and reinsertion, need for prophylactic antibiotics in the future, for example, dental procedures, possible need for manipulation, revision in the future and also anesthetic complications including DVT, PE, etc. We discussed the perioperative course, time in the hospital, postoperative recovery and the need for elevation to control swelling. We also discussed the predicted range of motion and the probability that squatting and kneeling would be unobtainable in the future. In addition, postoperative anticoagulation was discussed. We have obtained preoperative medical clearance as necessary. Provided illustrated handout and discussed it in detail. They will enroll in the total joint replacement educational forum at the hospital. ?Past Medical History:  ?Diagnosis Date  ? 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 Bladder outlet obstruction   ? BPH (benign prostatic hyperplasia)   ? Elevated PSA   ? per pt 2013  prostate bx negative for cancer  ? Exposure to asbestos   ? per pt followed by a law firm in [place],  told last CT 2018 okay  ? GERD (gastroesophageal reflux disease)   ? History of bladder cancer   ? per pt 2011-- s/p  TURBT superficial per pt  ? Hypertension   ? Incomplete right bundle branch block   ? Lower urinary tract symptoms (LUTS)   ? ? ?Past  Surgical History:  ?Procedure Laterality Date  ? APPENDECTOMY  age 18s  ? INGUINAL HERNIA REPAIR Right 12/27/2015  ? Procedure: HERNIA REPAIR RIGHT INGUINAL ADULT WITH MESH;  Surgeon: [name], MD;  Location: [place];  Service: General;  Laterality: Right;  ? INSERTION OF MESH Right 12/27/2015  ? Procedure: INSERTION OF MESH;  Surgeon: [name], MD;  Location: [place];  Service: General;  Laterality: Right;  ? TONSILLECTOMY  age 85  ? TOTAL KNEE ARTHROPLASTY Left 07/14/2020  ? Procedure: TOTAL KNEE ARTHROPLASTY;  Surgeon: [name], MD;  Location: [facility];  Service: Orthopedics;  Laterality: Left;  2.5 hrs  ? TRANSURETHRAL RESECTION OF PROSTATE N/A 11/20/2017  ? Procedure: TRANSURETHRAL RESECTION OF THE PROSTATE (TURP);  Surgeon: [name], MD;  Location: [facility];  Service: Urology;  Laterality: N/A;  ? ULNAR TUNNEL RELEASE Bilateral 2012;  2009  ? WRIST SURGERY Left 2013  ? no hardware  ? ? ?Family History  ?Problem Relation Age of Onset  ? Colon cancer Neg Hx   ? ?Social History:  reports that he has never smoked. He has never used smokeless tobacco. He reports that he does not drink alcohol and does not use drugs. ? ?Allergies: No Known Allergies ? ?Meds: ?amLODIPine 5 mg tablet ?aspirin 81 mg tablet,delayed release ?cephALEXin 500 mg capsule ?hydroCHLOROthiazide 25 mg tablet ?HYDROcodone 5 mg-acetaminophen 325 mg tablet ?losartan 100 mg tablet ?sildenafiL 100 mg tablet ? ?Review of Systems  ?Constitutional: Negative.   ?HENT: Negative.    ?Eyes: Negative.   ?  Respiratory: Negative.    ?Cardiovascular: Negative.   ?Gastrointestinal: Negative.   ?Endocrine: Negative.   ?Genitourinary: Negative.   ?Musculoskeletal:  Positive for arthralgias, gait problem and joint swelling.  ?Hematological: Negative.   ?Psychiatric/Behavioral: Negative.    ? ?There were no vitals taken for this visit. ?Physical Exam ?Constitutional:   ?   Appearance: Normal  appearance.  ?HENT:  ?   Head: Normocephalic and atraumatic.  ?   Right Ear: External ear normal.  ?   Left Ear: External ear normal.  ?   Nose: Nose normal.  ?   Mouth/Throat:  ?   Pharynx: Oropharynx is clear.  ?Eyes:  ?   Conjunctiva/sclera: Conjunctivae normal.  ?Cardiovascular:  ?   Rate and Rhythm: Normal rate and regular rhythm.  ?   Pulses: Normal pulses.  ?Pulmonary:  ?   Effort: Pulmonary effort is normal.  ?Abdominal:  ?   General: Bowel sounds are normal.  ?Musculoskeletal:  ?   Cervical back: Normal range of motion.  ?   Comments: Patient is awake, alert, oriented ?3. Well-nourished and well-developed. Antalgic gait with no assistive devices. ? ?On examination of the right knee, tender on palpation of the medial joint line. Nontender lateral joint line, patellar tendon, quadriceps tendon, patella, peroneal nerve and popliteal space. No calf pain or sign of DVT. No pain or laxity with varus or valgus stress. No instability noted. Negative McMurray's. Trace effusion noted. Range of motion 0 to 120 degrees. Positive patellofemoral crepitus. No patellofemoral pain on compression. Sensation intact distally. ?Prior standing x-rays from November reviewed today with bone-on-bone end-stage medial joint space narrowing of right knee with a slight varus deformity. Status post left total knee replacement in excellent alignment with no signs of osteolysis or loosening.  ?Skin: ?   General: Skin is warm and dry.  ?Neurological:  ?   Mental Status: He is alert.  ?  ? ?Assessment/Plan ?Impression: End-stage right knee osteoarthritis ?Plan: Pt with end-stage right knee DJD, bone-on-bone, refractory to conservative tx, scheduled for right total knee replacement by Dr. Tonita Cong on April 5. We again discussed the procedure itself as well as risks, complications and alternatives, including but not limited to DVT, PE, infx, bleeding, failure of procedure, need for secondary procedure including manipulation, nerve injury,  ongoing pain/symptoms, anesthesia risk, even stroke or death. Also discussed typical post-op protocols, activity restrictions, need for PT, flexion/extension exercises, time out of work. Discussed need for DVT ppx post-op per protocol. Discussed dental ppx and infx prevention. Also discussed limitations post-operatively such as kneeling and squatting. All questions were answered. Patient desires to proceed with surgery as scheduled. ?Will hold supplements, ASA and NSAIDs accordingly. Will remain NPO after midnight the night before surgery. Will present to [facility] for pre-op testing. Anticipate hospital stay to include at least 2 midnights given medical history and to ensure proper pain control. Plan ASA for DVT ppx post-op. Plan oxycodone, Robaxin, Colace, Miralax all to be [facility]. Plan outpatient PT post-op with family members at home for assistance. Will follow up 10-14 days post-op for staple removal and xrays. ? ?Plan Right total knee replacement ? ?[name], PA-C ?08/17/2021, 4:46 PM ? ? ? ?

## 2021-08-17 NOTE — H&P (View-Only) (Signed)
Joshua Bowers is an 71 y.o. male.   ?Chief Complaint: Right knee pain ?HPI: Patient is here for his H&P. The patient is scheduled for a right total knee replacement by Dr. Tonita Cong at [facility] on 08/24/21. ? ?He has failed conservative treatment and desires to proceed with total knee replacement. Pain is interfering with quality of life and activities of daily living at this point and he has exhausted conservative treatment. ? ?Dr. Tonita Cong and the patient mutually agreed to proceed with a total knee replacement. Risks and benefits of the procedure were discussed including stiffness, suboptimal range of motion, persistent pain, infection requiring removal of prosthesis and reinsertion, need for prophylactic antibiotics in the future, for example, dental procedures, possible need for manipulation, revision in the future and also anesthetic complications including DVT, PE, etc. We discussed the perioperative course, time in the hospital, postoperative recovery and the need for elevation to control swelling. We also discussed the predicted range of motion and the probability that squatting and kneeling would be unobtainable in the future. In addition, postoperative anticoagulation was discussed. We have obtained preoperative medical clearance as necessary. Provided illustrated handout and discussed it in detail. They will enroll in the total joint replacement educational forum at the hospital. ?Past Medical History:  ?Diagnosis Date  ? 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 Bladder outlet obstruction   ? BPH (benign prostatic hyperplasia)   ? Elevated PSA   ? per pt 2013  prostate bx negative for cancer  ? Exposure to asbestos   ? per pt followed by a law firm in [place],  told last CT 2018 okay  ? GERD (gastroesophageal reflux disease)   ? History of bladder cancer   ? per pt 2011-- s/p  TURBT superficial per pt  ? Hypertension   ? Incomplete right bundle branch block   ? Lower urinary tract symptoms (LUTS)   ? ? ?Past  Surgical History:  ?Procedure Laterality Date  ? APPENDECTOMY  age 73s  ? INGUINAL HERNIA REPAIR Right 12/27/2015  ? Procedure: HERNIA REPAIR RIGHT INGUINAL ADULT WITH MESH;  Surgeon: [name], MD;  Location: [place];  Service: General;  Laterality: Right;  ? INSERTION OF MESH Right 12/27/2015  ? Procedure: INSERTION OF MESH;  Surgeon: [name], MD;  Location: [place];  Service: General;  Laterality: Right;  ? TONSILLECTOMY  age 58  ? TOTAL KNEE ARTHROPLASTY Left 07/14/2020  ? Procedure: TOTAL KNEE ARTHROPLASTY;  Surgeon: [name], MD;  [facility]: [facility];  Service: Orthopedics;  Laterality: Left;  2.5 hrs  ? TRANSURETHRAL RESECTION OF PROSTATE N/A 11/20/2017  ? Procedure: TRANSURETHRAL RESECTION OF THE PROSTATE (TURP);  Surgeon: [name], MD;  Location: [facility];  Service: Urology;  Laterality: N/A;  ? ULNAR TUNNEL RELEASE Bilateral 2012;  2009  ? WRIST SURGERY Left 2013  ? no hardware  ? ? ?Family History  ?Problem Relation Age of Onset  ? Colon cancer Neg Hx   ? ?Social History:  reports that he has never smoked. He has never used smokeless tobacco. He reports that he does not drink alcohol and does not use drugs. ? ?Allergies: No Known Allergies ? ?Meds: ?amLODIPine 5 mg tablet ?aspirin 81 mg tablet,delayed release ?cephALEXin 500 mg capsule ?hydroCHLOROthiazide 25 mg tablet ?HYDROcodone 5 mg-acetaminophen 325 mg tablet ?losartan 100 mg tablet ?sildenafiL 100 mg tablet ? ?Review of Systems  ?Constitutional: Negative.   ?HENT: Negative.    ?Eyes: Negative.   ?  Respiratory: Negative.    ?Cardiovascular: Negative.   ?Gastrointestinal: Negative.   ?Endocrine: Negative.   ?Genitourinary: Negative.   ?Musculoskeletal:  Positive for arthralgias, gait problem and joint swelling.  ?Hematological: Negative.   ?Psychiatric/Behavioral: Negative.    ? ?There were no vitals taken for this visit. ?Physical Exam ?Constitutional:   ?   Appearance: Normal  appearance.  ?HENT:  ?   Head: Normocephalic and atraumatic.  ?   Right Ear: External ear normal.  ?   Left Ear: External ear normal.  ?   Nose: Nose normal.  ?   Mouth/Throat:  ?   Pharynx: Oropharynx is clear.  ?Eyes:  ?   Conjunctiva/sclera: Conjunctivae normal.  ?Cardiovascular:  ?   Rate and Rhythm: Normal rate and regular rhythm.  ?   Pulses: Normal pulses.  ?Pulmonary:  ?   Effort: Pulmonary effort is normal.  ?Abdominal:  ?   General: Bowel sounds are normal.  ?Musculoskeletal:  ?   Cervical back: Normal range of motion.  ?   Comments: Patient is awake, alert, oriented ?3. Well-nourished and well-developed. Antalgic gait with no assistive devices. ? ?On examination of the right knee, tender on palpation of the medial joint line. Nontender lateral joint line, patellar tendon, quadriceps tendon, patella, peroneal nerve and popliteal space. No calf pain or sign of DVT. No pain or laxity with varus or valgus stress. No instability noted. Negative McMurray's. Trace effusion noted. Range of motion 0 to 120 degrees. Positive patellofemoral crepitus. No patellofemoral pain on compression. Sensation intact distally. ?Prior standing x-rays from November reviewed today with bone-on-bone end-stage medial joint space narrowing of right knee with a slight varus deformity. Status post left total knee replacement in excellent alignment with no signs of osteolysis or loosening.  ?Skin: ?   General: Skin is warm and dry.  ?Neurological:  ?   Mental Status: He is alert.  ?  ? ?Assessment/Plan ?Impression: End-stage right knee osteoarthritis ?Plan: Pt with end-stage right knee DJD, bone-on-bone, refractory to conservative tx, scheduled for right total knee replacement by Dr. Tonita Cong on April 5. We again discussed the procedure itself as well as risks, complications and alternatives, including but not limited to DVT, PE, infx, bleeding, failure of procedure, need for secondary procedure including manipulation, nerve injury,  ongoing pain/symptoms, anesthesia risk, even stroke or death. Also discussed typical post-op protocols, activity restrictions, need for PT, flexion/extension exercises, time out of work. Discussed need for DVT ppx post-op per protocol. Discussed dental ppx and infx prevention. Also discussed limitations post-operatively such as kneeling and squatting. All questions were answered. Patient desires to proceed with surgery as scheduled. ?Will hold supplements, ASA and NSAIDs accordingly. Will remain NPO after midnight the night before surgery. Will present to [facility] for pre-op testing. Anticipate hospital stay to include at least 2 midnights given medical history and to ensure proper pain control. Plan ASA for DVT ppx post-op. Plan oxycodone, Robaxin, Colace, Miralax all [facility]. Plan outpatient PT post-op with family members at home for assistance. Will follow up 10-14 days post-op for staple removal and xrays. ? ?Plan Right total knee replacement ? ?[name], PA-C ?08/17/2021, 4:46 PM ? ? ? ?

## 2021-08-24 ENCOUNTER — Encounter (HOSPITAL_COMMUNITY)
Admission: RE | Disposition: A | Discharge status: Home Health | Payer: Self-pay | Source: Home / Self Care | Attending: Specialist

## 2021-08-24 ENCOUNTER — Other Ambulatory Visit: Payer: Self-pay

## 2021-08-24 ENCOUNTER — Encounter (HOSPITAL_COMMUNITY): Payer: Self-pay | Admitting: Specialist

## 2021-08-24 ENCOUNTER — Ambulatory Visit (HOSPITAL_COMMUNITY): Payer: Medicare Other | Admitting: Physician Assistant

## 2021-08-24 ENCOUNTER — Ambulatory Visit (HOSPITAL_COMMUNITY): Payer: Medicare Other | Admitting: Certified Registered Nurse Anesthetist

## 2021-08-24 ENCOUNTER — Inpatient Hospital Stay (HOSPITAL_COMMUNITY): Payer: Medicare Other

## 2021-08-24 ENCOUNTER — Inpatient Hospital Stay (HOSPITAL_COMMUNITY)
Admission: RE | Admit: 2021-08-24 | Discharge: 2021-08-26 | DRG: 470 | Disposition: A | Discharge status: Home Health | Payer: Medicare Other | Attending: Specialist | Admitting: Specialist

## 2021-08-24 DIAGNOSIS — Z96652 Presence of left artificial knee joint: Secondary | ICD-10-CM | POA: Diagnosis present

## 2021-08-24 DIAGNOSIS — Z8551 Personal history of malignant neoplasm of bladder: Secondary | ICD-10-CM | POA: Diagnosis not present

## 2021-08-24 DIAGNOSIS — Z79891 Long term (current) use of opiate analgesic: Secondary | ICD-10-CM

## 2021-08-24 DIAGNOSIS — N289 Disorder of kidney and ureter, unspecified: Secondary | ICD-10-CM | POA: Diagnosis not present

## 2021-08-24 DIAGNOSIS — I10 Essential (primary) hypertension: Secondary | ICD-10-CM | POA: Diagnosis present

## 2021-08-24 DIAGNOSIS — M1711 Unilateral primary osteoarthritis, right knee: Secondary | ICD-10-CM

## 2021-08-24 DIAGNOSIS — Z7982 Long term (current) use of aspirin: Secondary | ICD-10-CM | POA: Diagnosis not present

## 2021-08-24 DIAGNOSIS — K219 Gastro-esophageal reflux disease without esophagitis: Secondary | ICD-10-CM | POA: Diagnosis present

## 2021-08-24 DIAGNOSIS — I4510 Unspecified right bundle-branch block: Secondary | ICD-10-CM | POA: Diagnosis present

## 2021-08-24 DIAGNOSIS — Z77090 Contact with and (suspected) exposure to asbestos: Secondary | ICD-10-CM | POA: Diagnosis present

## 2021-08-24 DIAGNOSIS — G8929 Other chronic pain: Secondary | ICD-10-CM | POA: Diagnosis present

## 2021-08-24 DIAGNOSIS — Z9049 Acquired absence of other specified parts of digestive tract: Secondary | ICD-10-CM | POA: Diagnosis not present

## 2021-08-24 DIAGNOSIS — M25561 Pain in right knee: Secondary | ICD-10-CM | POA: Diagnosis present

## 2021-08-24 DIAGNOSIS — Z01818 Encounter for other preprocedural examination: Secondary | ICD-10-CM

## 2021-08-24 DIAGNOSIS — Z79899 Other long term (current) drug therapy: Secondary | ICD-10-CM

## 2021-08-24 DIAGNOSIS — Z9079 Acquired absence of other genital organ(s): Secondary | ICD-10-CM

## 2021-08-24 DIAGNOSIS — N400 Enlarged prostate without lower urinary tract symptoms: Secondary | ICD-10-CM | POA: Diagnosis present

## 2021-08-24 HISTORY — PX: TOTAL KNEE ARTHROPLASTY: SHX125

## 2021-08-24 LAB — SURGICAL PCR SCREEN
MRSA, PCR: NEGATIVE
Staphylococcus aureus: NEGATIVE

## 2021-08-24 SURGERY — ARTHROPLASTY, KNEE, TOTAL
Anesthesia: Monitor Anesthesia Care | Site: Knee | Laterality: Right

## 2021-08-24 MED ORDER — METOCLOPRAMIDE HCL 5 MG/ML IJ SOLN: 5.0000 mg | Freq: Three times a day (TID) | INTRAMUSCULAR | Status: DC | PRN

## 2021-08-24 MED ORDER — ASPIRIN EC 81 MG PO TBEC: 81.0000 mg | DELAYED_RELEASE_TABLET | Freq: Two times a day (BID) | ORAL | 1 refills | Status: DC

## 2021-08-24 MED ORDER — LACTATED RINGERS IV SOLN: INTRAVENOUS | Status: DC

## 2021-08-24 MED ORDER — DIPHENHYDRAMINE HCL 12.5 MG/5ML PO ELIX: 12.5000 mg | ORAL_SOLUTION | ORAL | Status: DC | PRN

## 2021-08-24 MED ORDER — DOCUSATE SODIUM 100 MG PO CAPS
100.0000 mg | ORAL_CAPSULE | Freq: Two times a day (BID) | ORAL | Status: DC
  Administered 2021-08-24 – 2021-08-26 (×4): 100 mg via ORAL
  Filled 2021-08-24 (×4): qty 1

## 2021-08-24 MED ORDER — ONDANSETRON HCL 4 MG/2ML IJ SOLN
INTRAMUSCULAR | Status: AC
  Filled 2021-08-24: qty 2

## 2021-08-24 MED ORDER — 0.9 % SODIUM CHLORIDE (POUR BTL) OPTIME
TOPICAL | Status: DC | PRN
  Administered 2021-08-24: 1000 mL

## 2021-08-24 MED ORDER — DEXAMETHASONE SODIUM PHOSPHATE 10 MG/ML IJ SOLN
INTRAMUSCULAR | Status: AC
  Filled 2021-08-24: qty 1

## 2021-08-24 MED ORDER — PROPOFOL 1000 MG/100ML IV EMUL
INTRAVENOUS | Status: AC
  Filled 2021-08-24: qty 100

## 2021-08-24 MED ORDER — ADULT MULTIVITAMIN W/MINERALS CH
1.0000 | ORAL_TABLET | Freq: Every day | ORAL | Status: DC
  Administered 2021-08-24 – 2021-08-26 (×3): 1 via ORAL
  Filled 2021-08-24 (×3): qty 1

## 2021-08-24 MED ORDER — LORAZEPAM 0.5 MG PO TABS
0.5000 mg | ORAL_TABLET | ORAL | Status: DC | PRN
  Administered 2021-08-24: 1 mg via ORAL
  Administered 2021-08-25: 0.5 mg via ORAL
  Administered 2021-08-25: 1 mg via ORAL
  Filled 2021-08-24 (×2): qty 2
  Filled 2021-08-24: qty 1

## 2021-08-24 MED ORDER — ONDANSETRON HCL 4 MG/2ML IJ SOLN
INTRAMUSCULAR | Status: DC | PRN
  Administered 2021-08-24: 4 mg via INTRAVENOUS

## 2021-08-24 MED ORDER — SODIUM CHLORIDE 0.9% FLUSH
INTRAVENOUS | Status: DC | PRN
  Administered 2021-08-24: 40 mL via INTRAVENOUS

## 2021-08-24 MED ORDER — ASPIRIN EC 81 MG PO TBEC: 81.0000 mg | DELAYED_RELEASE_TABLET | Freq: Two times a day (BID) | ORAL | 1 refills | Status: AC

## 2021-08-24 MED ORDER — PHENOL 1.4 % MT LIQD: 1.0000 | OROMUCOSAL | Status: DC | PRN

## 2021-08-24 MED ORDER — TRANEXAMIC ACID-NACL 1000-0.7 MG/100ML-% IV SOLN
1000.0000 mg | INTRAVENOUS | Status: AC
Start: 2021-08-24 — End: 2021-08-24
  Administered 2021-08-24: 1000 mg via INTRAVENOUS
  Filled 2021-08-24: qty 100

## 2021-08-24 MED ORDER — TRANEXAMIC ACID 1000 MG/10ML IV SOLN
2000.0000 mg | Freq: Once | INTRAVENOUS | Status: AC
  Administered 2021-08-24: 2000 mg via TOPICAL
  Filled 2021-08-24: qty 20

## 2021-08-24 MED ORDER — POLYETHYLENE GLYCOL 3350 17 G PO PACK: 17.0000 g | PACK | Freq: Every day | ORAL | Status: DC | PRN

## 2021-08-24 MED ORDER — OXYCODONE HCL 5 MG/5ML PO SOLN: 5.0000 mg | Freq: Once | ORAL | Status: DC | PRN

## 2021-08-24 MED ORDER — ACETAMINOPHEN 10 MG/ML IV SOLN
1000.0000 mg | INTRAVENOUS | Status: AC
  Administered 2021-08-24: 1000 mg via INTRAVENOUS
  Filled 2021-08-24: qty 100

## 2021-08-24 MED ORDER — VITAMIN D 25 MCG (1000 UNIT) PO TABS
1000.0000 [IU] | ORAL_TABLET | Freq: Every day | ORAL | Status: DC
  Administered 2021-08-24 – 2021-08-26 (×3): 1000 [IU] via ORAL
  Filled 2021-08-24 (×3): qty 1

## 2021-08-24 MED ORDER — OXYCODONE HCL 5 MG PO TABS
5.0000 mg | ORAL_TABLET | ORAL | Status: DC | PRN
  Administered 2021-08-24 (×2): 10 mg via ORAL
  Filled 2021-08-24 (×2): qty 2

## 2021-08-24 MED ORDER — BUPIVACAINE LIPOSOME 1.3 % IJ SUSP
INTRAMUSCULAR | Status: AC
  Filled 2021-08-24: qty 20

## 2021-08-24 MED ORDER — BUPIVACAINE-EPINEPHRINE (PF) 0.25% -1:200000 IJ SOLN
INTRAMUSCULAR | Status: AC
  Filled 2021-08-24: qty 30

## 2021-08-24 MED ORDER — ONDANSETRON HCL 4 MG PO TABS: 4.0000 mg | ORAL_TABLET | Freq: Four times a day (QID) | ORAL | Status: DC | PRN

## 2021-08-24 MED ORDER — HYDROMORPHONE HCL 1 MG/ML IJ SOLN: 0.2500 mg | INTRAMUSCULAR | Status: DC | PRN

## 2021-08-24 MED ORDER — CEFAZOLIN SODIUM-DEXTROSE 2-4 GM/100ML-% IV SOLN
2.0000 g | INTRAVENOUS | Status: AC
  Administered 2021-08-24: 2 g via INTRAVENOUS
  Filled 2021-08-24: qty 100

## 2021-08-24 MED ORDER — OXYCODONE HCL 5 MG PO TABS: 5.0000 mg | ORAL_TABLET | Freq: Once | ORAL | Status: DC | PRN

## 2021-08-24 MED ORDER — HYDROMORPHONE HCL 1 MG/ML IJ SOLN
0.5000 mg | INTRAMUSCULAR | Status: DC | PRN
  Administered 2021-08-25 – 2021-08-26 (×4): 1 mg via INTRAVENOUS
  Filled 2021-08-24 (×4): qty 1

## 2021-08-24 MED ORDER — ASPIRIN 81 MG PO CHEW
81.0000 mg | CHEWABLE_TABLET | Freq: Two times a day (BID) | ORAL | Status: DC
  Administered 2021-08-25 – 2021-08-26 (×3): 81 mg via ORAL
  Filled 2021-08-24 (×3): qty 1

## 2021-08-24 MED ORDER — OXYCODONE HCL 5 MG PO TABS
10.0000 mg | ORAL_TABLET | ORAL | Status: DC | PRN
  Administered 2021-08-24 – 2021-08-25 (×3): 10 mg via ORAL
  Administered 2021-08-25 – 2021-08-26 (×4): 15 mg via ORAL
  Filled 2021-08-24: qty 3
  Filled 2021-08-24: qty 2
  Filled 2021-08-24: qty 3
  Filled 2021-08-24: qty 2
  Filled 2021-08-24 (×2): qty 3
  Filled 2021-08-24: qty 2

## 2021-08-24 MED ORDER — MENTHOL 3 MG MT LOZG: 1.0000 | LOZENGE | OROMUCOSAL | Status: DC | PRN

## 2021-08-24 MED ORDER — BISACODYL 5 MG PO TBEC: 5.0000 mg | DELAYED_RELEASE_TABLET | Freq: Every day | ORAL | Status: DC | PRN

## 2021-08-24 MED ORDER — TIZANIDINE HCL 4 MG PO TABS
4.0000 mg | ORAL_TABLET | Freq: Three times a day (TID) | ORAL | Status: DC | PRN
  Administered 2021-08-24 – 2021-08-26 (×4): 4 mg via ORAL
  Filled 2021-08-24 (×4): qty 1

## 2021-08-24 MED ORDER — FENTANYL CITRATE (PF) 100 MCG/2ML IJ SOLN
INTRAMUSCULAR | Status: AC
  Filled 2021-08-24: qty 2

## 2021-08-24 MED ORDER — MIDAZOLAM HCL 5 MG/5ML IJ SOLN
INTRAMUSCULAR | Status: DC | PRN
  Administered 2021-08-24: 2 mg via INTRAVENOUS

## 2021-08-24 MED ORDER — BUPIVACAINE LIPOSOME 1.3 % IJ SUSP
INTRAMUSCULAR | Status: DC | PRN
  Administered 2021-08-24: 20 mL

## 2021-08-24 MED ORDER — STERILE WATER FOR IRRIGATION IR SOLN
Status: DC | PRN
  Administered 2021-08-24: 2000 mL

## 2021-08-24 MED ORDER — DEXMEDETOMIDINE (PRECEDEX) IN NS 20 MCG/5ML (4 MCG/ML) IV SYRINGE
PREFILLED_SYRINGE | INTRAVENOUS | Status: DC | PRN
  Administered 2021-08-24: 12 ug via INTRAVENOUS
  Administered 2021-08-24: 8 ug via INTRAVENOUS

## 2021-08-24 MED ORDER — BUPIVACAINE-EPINEPHRINE 0.25% -1:200000 IJ SOLN
INTRAMUSCULAR | Status: DC | PRN
  Administered 2021-08-24: 30 mL

## 2021-08-24 MED ORDER — AMLODIPINE BESYLATE 5 MG PO TABS
5.0000 mg | ORAL_TABLET | Freq: Every morning | ORAL | Status: DC
  Administered 2021-08-25 – 2021-08-26 (×2): 5 mg via ORAL
  Filled 2021-08-24 (×2): qty 1

## 2021-08-24 MED ORDER — PHENYLEPHRINE 40 MCG/ML (10ML) SYRINGE FOR IV PUSH (FOR BLOOD PRESSURE SUPPORT)
PREFILLED_SYRINGE | INTRAVENOUS | Status: DC | PRN
  Administered 2021-08-24 (×3): 80 ug via INTRAVENOUS
  Administered 2021-08-24: 40 ug via INTRAVENOUS

## 2021-08-24 MED ORDER — DEXMEDETOMIDINE (PRECEDEX) IN NS 20 MCG/5ML (4 MCG/ML) IV SYRINGE
PREFILLED_SYRINGE | INTRAVENOUS | Status: AC
  Filled 2021-08-24: qty 5

## 2021-08-24 MED ORDER — PROPOFOL 500 MG/50ML IV EMUL
INTRAVENOUS | Status: AC
  Filled 2021-08-24: qty 50

## 2021-08-24 MED ORDER — ALUM & MAG HYDROXIDE-SIMETH 200-200-20 MG/5ML PO SUSP
30.0000 mL | ORAL | Status: DC | PRN
  Administered 2021-08-25: 30 mL via ORAL
  Filled 2021-08-24: qty 30

## 2021-08-24 MED ORDER — TIZANIDINE HCL 4 MG PO CAPS: 4.0000 mg | ORAL_CAPSULE | Freq: Three times a day (TID) | ORAL | 1 refills | Status: DC | PRN

## 2021-08-24 MED ORDER — POLYETHYLENE GLYCOL 3350 17 G PO PACK: 17.0000 g | PACK | Freq: Every day | ORAL | 0 refills | Status: DC

## 2021-08-24 MED ORDER — ONDANSETRON HCL 4 MG/2ML IJ SOLN: 4.0000 mg | Freq: Once | INTRAMUSCULAR | Status: DC | PRN

## 2021-08-24 MED ORDER — METOCLOPRAMIDE HCL 5 MG PO TABS
5.0000 mg | ORAL_TABLET | Freq: Three times a day (TID) | ORAL | Status: DC | PRN
  Administered 2021-08-25: 10 mg via ORAL
  Filled 2021-08-24: qty 2

## 2021-08-24 MED ORDER — KCL IN DEXTROSE-NACL 20-5-0.45 MEQ/L-%-% IV SOLN
INTRAVENOUS | Status: AC
  Filled 2021-08-24 (×2): qty 1000

## 2021-08-24 MED ORDER — CEFAZOLIN SODIUM-DEXTROSE 2-4 GM/100ML-% IV SOLN
2.0000 g | Freq: Four times a day (QID) | INTRAVENOUS | Status: AC
  Administered 2021-08-24 (×2): 2 g via INTRAVENOUS
  Filled 2021-08-24 (×2): qty 100

## 2021-08-24 MED ORDER — PROPOFOL 10 MG/ML IV BOLUS
INTRAVENOUS | Status: DC | PRN
  Administered 2021-08-24: 40 mg via INTRAVENOUS
  Administered 2021-08-24: 30 mg via INTRAVENOUS

## 2021-08-24 MED ORDER — LOSARTAN POTASSIUM 50 MG PO TABS
100.0000 mg | ORAL_TABLET | Freq: Every morning | ORAL | Status: DC
  Administered 2021-08-25 – 2021-08-26 (×2): 100 mg via ORAL
  Filled 2021-08-24 (×2): qty 2

## 2021-08-24 MED ORDER — BUPIVACAINE IN DEXTROSE 0.75-8.25 % IT SOLN
INTRATHECAL | Status: DC | PRN
  Administered 2021-08-24: 2 mL via INTRATHECAL

## 2021-08-24 MED ORDER — OXYCODONE HCL 5 MG PO TABS: 5.0000 mg | ORAL_TABLET | ORAL | 0 refills | Status: DC | PRN

## 2021-08-24 MED ORDER — SODIUM CHLORIDE 0.9 % IR SOLN
Status: DC | PRN
  Administered 2021-08-24: 1000 mL

## 2021-08-24 MED ORDER — ASCORBIC ACID 500 MG PO TABS: 500.0000 mg | ORAL_TABLET | Freq: Every day | ORAL | Status: DC | PRN

## 2021-08-24 MED ORDER — ROPIVACAINE HCL 5 MG/ML IJ SOLN
INTRAMUSCULAR | Status: DC | PRN
  Administered 2021-08-24: 30 mL via PERINEURAL

## 2021-08-24 MED ORDER — DEXAMETHASONE SODIUM PHOSPHATE 10 MG/ML IJ SOLN
INTRAMUSCULAR | Status: DC | PRN
  Administered 2021-08-24: 10 mg

## 2021-08-24 MED ORDER — MIDAZOLAM HCL 2 MG/2ML IJ SOLN
INTRAMUSCULAR | Status: AC
  Filled 2021-08-24: qty 2

## 2021-08-24 MED ORDER — ZINC SULFATE 220 (50 ZN) MG PO CAPS
220.0000 mg | ORAL_CAPSULE | Freq: Every day | ORAL | Status: DC
  Administered 2021-08-24 – 2021-08-26 (×3): 220 mg via ORAL
  Filled 2021-08-24 (×3): qty 1

## 2021-08-24 MED ORDER — AMISULPRIDE (ANTIEMETIC) 5 MG/2ML IV SOLN: 10.0000 mg | Freq: Once | INTRAVENOUS | Status: DC | PRN

## 2021-08-24 MED ORDER — SODIUM CHLORIDE (PF) 0.9 % IJ SOLN
INTRAMUSCULAR | Status: AC
  Filled 2021-08-24: qty 50

## 2021-08-24 MED ORDER — PHENYLEPHRINE HCL-NACL 20-0.9 MG/250ML-% IV SOLN
INTRAVENOUS | Status: AC
  Filled 2021-08-24: qty 500

## 2021-08-24 MED ORDER — PROPOFOL 500 MG/50ML IV EMUL
INTRAVENOUS | Status: DC | PRN
  Administered 2021-08-24: 125 ug/kg/min via INTRAVENOUS

## 2021-08-24 MED ORDER — PHENYLEPHRINE HCL-NACL 20-0.9 MG/250ML-% IV SOLN
INTRAVENOUS | Status: DC | PRN
  Administered 2021-08-24: 20 ug/min via INTRAVENOUS

## 2021-08-24 MED ORDER — ONDANSETRON HCL 4 MG/2ML IJ SOLN
4.0000 mg | Freq: Four times a day (QID) | INTRAMUSCULAR | Status: DC | PRN
  Administered 2021-08-25: 4 mg via INTRAVENOUS
  Filled 2021-08-24: qty 2

## 2021-08-24 MED ORDER — ACETAMINOPHEN 500 MG PO TABS
1000.0000 mg | ORAL_TABLET | Freq: Four times a day (QID) | ORAL | Status: AC
  Administered 2021-08-24 – 2021-08-25 (×3): 1000 mg via ORAL
  Filled 2021-08-24 (×3): qty 2

## 2021-08-24 MED ORDER — FENTANYL CITRATE (PF) 100 MCG/2ML IJ SOLN
INTRAMUSCULAR | Status: DC | PRN
  Administered 2021-08-24: 100 ug via INTRAVENOUS

## 2021-08-24 MED ORDER — RISAQUAD PO CAPS
1.0000 | ORAL_CAPSULE | Freq: Every day | ORAL | Status: DC
  Administered 2021-08-24 – 2021-08-26 (×3): 1 via ORAL
  Filled 2021-08-24 (×3): qty 1

## 2021-08-24 MED ORDER — DOCUSATE SODIUM 100 MG PO CAPS: 100.0000 mg | ORAL_CAPSULE | Freq: Two times a day (BID) | ORAL | 1 refills | Status: DC | PRN

## 2021-08-24 MED ORDER — ACETAMINOPHEN 325 MG PO TABS
325.0000 mg | ORAL_TABLET | Freq: Four times a day (QID) | ORAL | Status: DC | PRN
  Administered 2021-08-26: 650 mg via ORAL
  Filled 2021-08-24: qty 2

## 2021-08-24 SURGICAL SUPPLY — 87 items
AGENT HMST SPONGE THK3/8 (HEMOSTASIS)
ATTUNE MED DOME PAT 38 KNEE (Knees) ×1 IMPLANT
ATTUNE PS FEM RT SZ 7 CEM KNEE (Femur) ×1 IMPLANT
ATTUNE PSRP INSR SZ7 6 KNEE (Insert) ×1 IMPLANT
BAG COUNTER SPONGE SURGICOUNT (BAG) IMPLANT
BAG DECANTER FOR FLEXI CONT (MISCELLANEOUS) ×3 IMPLANT
BAG SPEC THK2 15X12 ZIP CLS (MISCELLANEOUS)
BAG SPNG CNTER NS LX DISP (BAG)
BAG ZIPLOCK 12X15 (MISCELLANEOUS) IMPLANT
BASE TIBIAL ROT PLAT SZ 7 KNEE (Knees) IMPLANT
BLADE SAW SGTL 11.0X1.19X90.0M (BLADE) ×3 IMPLANT
BLADE SAW SGTL 13.0X1.19X90.0M (BLADE) ×3 IMPLANT
BLADE SURG SZ10 CARB STEEL (BLADE) ×6 IMPLANT
BNDG CMPR STD VLCR NS LF 5.8X4 (GAUZE/BANDAGES/DRESSINGS) ×1
BNDG COHESIVE 4X5 TAN ST LF (GAUZE/BANDAGES/DRESSINGS) ×3 IMPLANT
BNDG ELASTIC 4X5.8 VLCR NS LF (GAUZE/BANDAGES/DRESSINGS) ×1 IMPLANT
BNDG ELASTIC 4X5.8 VLCR STR LF (GAUZE/BANDAGES/DRESSINGS) ×3 IMPLANT
BNDG ELASTIC 6X5.8 VLCR STR LF (GAUZE/BANDAGES/DRESSINGS) ×3 IMPLANT
BOWL SMART MIX CTS (DISPOSABLE) ×3 IMPLANT
BSPLAT TIB 7 CMNT ROT PLAT STR (Knees) ×1 IMPLANT
CEMENT HV SMART SET (Cement) ×5 IMPLANT
CLSR STERI-STRIP ANTIMIC 1/2X4 (GAUZE/BANDAGES/DRESSINGS) ×1 IMPLANT
COVER SURGICAL LIGHT HANDLE (MISCELLANEOUS) ×3 IMPLANT
CUFF TOURN SGL QUICK 34 (TOURNIQUET CUFF) ×2
CUFF TRNQT CYL 34X4.125X (TOURNIQUET CUFF) ×2 IMPLANT
DRAPE INCISE IOBAN 66X45 STRL (DRAPES) ×3 IMPLANT
DRAPE ORTHO SPLIT 77X108 STRL (DRAPES) ×4
DRAPE SHEET LG 3/4 BI-LAMINATE (DRAPES) ×6 IMPLANT
DRAPE SURG ORHT 6 SPLT 77X108 (DRAPES) ×4 IMPLANT
DRAPE U-SHAPE 47X51 STRL (DRAPES) ×3 IMPLANT
DRESSING AQUACEL AG SP 3.5X10 (GAUZE/BANDAGES/DRESSINGS) IMPLANT
DRSG AQUACEL AG ADV 3.5X10 (GAUZE/BANDAGES/DRESSINGS) ×3 IMPLANT
DRSG AQUACEL AG SP 3.5X10 (GAUZE/BANDAGES/DRESSINGS) ×2
DRSG TEGADERM 4X4.75 (GAUZE/BANDAGES/DRESSINGS) IMPLANT
DURAPREP 26ML APPLICATOR (WOUND CARE) ×3 IMPLANT
ELECT BLADE TIP CTD 4 INCH (ELECTRODE) ×3 IMPLANT
ELECT REM PT RETURN 15FT ADLT (MISCELLANEOUS) ×3 IMPLANT
EVACUATOR 1/8 PVC DRAIN (DRAIN) IMPLANT
GAUZE SPONGE 2X2 8PLY STRL LF (GAUZE/BANDAGES/DRESSINGS) IMPLANT
GAUZE SPONGE 4X4 12PLY STRL (GAUZE/BANDAGES/DRESSINGS) ×1 IMPLANT
GLOVE SRG 8 PF TXTR STRL LF DI (GLOVE) ×2 IMPLANT
GLOVE SURG POLYISO LF SZ7.5 (GLOVE) ×6 IMPLANT
GLOVE SURG POLYISO LF SZ8 (GLOVE) ×6 IMPLANT
GLOVE SURG UNDER POLY LF SZ7.5 (GLOVE) ×3 IMPLANT
GLOVE SURG UNDER POLY LF SZ8 (GLOVE) ×2
GOWN STRL REUS W/ TWL XL LVL3 (GOWN DISPOSABLE) ×4 IMPLANT
GOWN STRL REUS W/TWL XL LVL3 (GOWN DISPOSABLE) ×4
HANDPIECE INTERPULSE COAX TIP (DISPOSABLE) ×2
HEMOSTAT SPONGE AVITENE ULTRA (HEMOSTASIS) IMPLANT
HOLDER FOLEY CATH W/STRAP (MISCELLANEOUS) IMPLANT
IMMOBILIZER KNEE 20 (SOFTGOODS) ×2
IMMOBILIZER KNEE 20 THIGH 36 (SOFTGOODS) ×2 IMPLANT
JET LAVAGE IRRISEPT WOUND (IRRIGATION / IRRIGATOR) ×2
KIT TURNOVER KIT A (KITS) IMPLANT
LAVAGE JET IRRISEPT WOUND (IRRIGATION / IRRIGATOR) ×2 IMPLANT
MANIFOLD NEPTUNE II (INSTRUMENTS) ×3 IMPLANT
NDL SAFETY ECLIPSE 18X1.5 (NEEDLE) IMPLANT
NEEDLE HYPO 18GX1.5 SHARP (NEEDLE)
NS IRRIG 1000ML POUR BTL (IV SOLUTION) IMPLANT
PACK TOTAL KNEE CUSTOM (KITS) ×3 IMPLANT
PAD CAST 3X4 CTTN HI CHSV (CAST SUPPLIES) IMPLANT
PADDING CAST COTTON 3X4 STRL (CAST SUPPLIES) ×2
PROTECTOR NERVE ULNAR (MISCELLANEOUS) ×3 IMPLANT
SAW OSC TIP CART 19.5X105X1.3 (SAW) ×3 IMPLANT
SEALER BIPOLAR AQUA 6.0 (INSTRUMENTS) ×3 IMPLANT
SET HNDPC FAN SPRY TIP SCT (DISPOSABLE) ×2 IMPLANT
SPIKE FLUID TRANSFER (MISCELLANEOUS) ×3 IMPLANT
SPONGE GAUZE 2X2 STER 10/PKG (GAUZE/BANDAGES/DRESSINGS)
SPONGE SURGIFOAM ABS GEL 100 (HEMOSTASIS) IMPLANT
STAPLER VISISTAT (STAPLE) IMPLANT
STRIP CLOSURE SKIN 1/2X4 (GAUZE/BANDAGES/DRESSINGS) IMPLANT
SUT BONE WAX W31G (SUTURE) ×3 IMPLANT
SUT MNCRL AB 4-0 PS2 18 (SUTURE) IMPLANT
SUT STRATAFIX 0 PDS 27 VIOLET (SUTURE) ×2
SUT VIC AB 1 CT1 27 (SUTURE) ×4
SUT VIC AB 1 CT1 27XBRD ANTBC (SUTURE) ×4 IMPLANT
SUT VIC AB 1 CTX 36 (SUTURE)
SUT VIC AB 1 CTX36XBRD ANBCTR (SUTURE) IMPLANT
SUT VIC AB 2-0 CT1 27 (SUTURE) ×6
SUT VIC AB 2-0 CT1 TAPERPNT 27 (SUTURE) ×6 IMPLANT
SUTURE STRATFX 0 PDS 27 VIOLET (SUTURE) ×2 IMPLANT
SYR 3ML LL SCALE MARK (SYRINGE) IMPLANT
TIBIAL BASE ROT PLAT SZ 7 KNEE (Knees) ×2 IMPLANT
TRAY FOLEY MTR SLVR 16FR STAT (SET/KITS/TRAYS/PACK) ×3 IMPLANT
WATER STERILE IRR 1000ML POUR (IV SOLUTION) ×3 IMPLANT
WIPE CHG CHLORHEXIDINE 2% (PERSONAL CARE ITEMS) ×3 IMPLANT
WRAP KNEE MAXI GEL POST OP (GAUZE/BANDAGES/DRESSINGS) ×3 IMPLANT

## 2021-08-24 NOTE — Transfer of Care (Signed)
Immediate Anesthesia Transfer of Care Note ? ?Patient: Joshua Bowers ? ?Procedure(s) Performed: TOTAL KNEE ARTHROPLASTY (Right: Knee) ? ?Patient Location: PACU ? ?Anesthesia Type:Spinal and MAC combined with regional for post-op pain ? ?Level of Consciousness: drowsy ? ?Airway & Oxygen Therapy: Patient Spontanous Breathing and Patient connected to face mask oxygen ? ?Post-op Assessment: Report given to RN and Post -op Vital signs reviewed and stable ? ?Post vital signs: Reviewed and stable ? ?Last Vitals:  ?Vitals Value Taken Time  ?BP 106/66 08/24/21 1115  ?Temp    ?Pulse 77 08/24/21 1116  ?Resp 12 08/24/21 1116  ?SpO2 97 % 08/24/21 1116  ?Vitals shown include unvalidated device data. ? ?Last Pain:  ?Vitals:  ? 08/24/21 0715  ?TempSrc: Oral  ?PainSc:   ?   ? ?  ? ?Complications: No notable events documented. ?

## 2021-08-24 NOTE — Anesthesia Procedure Notes (Signed)
Anesthesia Regional Block: Adductor canal block  ? ?Pre-Anesthetic Checklist: , timeout performed,  Correct Patient, Correct Site, Correct Laterality,  Correct Procedure, Correct Position, site marked,  Risks and benefits discussed,  Surgical consent,  Pre-op evaluation,  At surgeon's request and post-op pain management ? ?Laterality: Right ? ?Prep: Maximum Sterile Barrier Precautions used, chloraprep     ?  ?Needles:  ?Injection technique: Single-shot ? ?Needle Type: Echogenic Stimulator Needle   ? ? ?Needle Length: 9cm  ?Needle Gauge: 22  ? ? ? ?Additional Needles: ? ? ?Procedures:,,,, ultrasound used (permanent image in chart),,    ?Narrative:  ?Start time: 08/24/2021 7:55 AM ?End time: 08/24/2021 8:00 AM ?Injection made incrementally with aspirations every 5 mL. ? ?Performed by: Personally  ?Anesthesiologist: Pervis Hocking, DO ? ?Additional Notes: ?Monitors applied. No increased pain on injection. No increased resistance to injection. Injection made in 5cc increments. Good needle visualization. Patient tolerated procedure well.  ? ? ? ? ?

## 2021-08-24 NOTE — Care Plan (Signed)
Ortho Bundle Case Management Note ? ?Patient Details  ?Name: Joshua Bowers ?MRN: [id number] ?Date of Birth: [birth year] ? ?                ?R TKA on 08-24-21 DCP: Home with wife DME: No needs, has a RW PT: [facility] on 08-29-21 at 1:45 pm. ? ? ?DME Arranged:  N/A ?DME Agency:    ? ?HH Arranged:    ?[place] Agency:    ? ?Additional Comments: ?Please contact me with any questions of if this plan should need to change. ? ?Larwance Rote  [phone] ?08/24/2021, 8:11 AM ?  ?

## 2021-08-24 NOTE — Anesthesia Postprocedure Evaluation (Signed)
Anesthesia Post Note ? ?Patient: Joshua Bowers ? ?Procedure(s) Performed: TOTAL KNEE ARTHROPLASTY (Right: Knee) ? ?  ? ?Patient location during evaluation: PACU ?Anesthesia Type: Regional, MAC and Spinal ?Level of consciousness: awake and alert and oriented ?Pain management: pain level controlled ?Vital Signs Assessment: post-procedure vital signs reviewed and stable ?Respiratory status: spontaneous breathing, nonlabored ventilation and respiratory function stable ?Cardiovascular status: blood pressure returned to baseline and stable ?Postop Assessment: no headache, no backache, spinal receding and no apparent nausea or vomiting ?Anesthetic complications: no ? ? ?No notable events documented. ? ?Last Vitals:  ?Vitals:  ? 08/24/21 0715 08/24/21 1115  ?BP: (!) 151/90 106/66  ?Pulse: 61 70  ?Resp: 18 12  ?Temp: 36.7 ?C   ?SpO2: 96% 97%  ?  ?Last Pain:  ?Vitals:  ? 08/24/21 1115  ?TempSrc:   ?PainSc: 0-No pain  ? ? ?  ?  ?  ?  ?  ?  ? ?Jarome Matin Takoda Siedlecki ? ? ? ? ?

## 2021-08-24 NOTE — Anesthesia Procedure Notes (Signed)
Procedure Name: [place] ?Date/Time: 08/24/2021 8:24 AM ?Performed by: West Pugh, CRNA ?Pre-anesthesia Checklist: Patient identified, Emergency Drugs available, Suction available, Patient being monitored and Timeout performed ?Patient Re-evaluated:Patient Re-evaluated prior to induction ?Oxygen Delivery Method: Simple face mask ?Preoxygenation: Pre-oxygenation with 100% oxygen ?Induction Type: IV induction ?Placement Confirmation: positive ETCO2 ?Dental Injury: Teeth and Oropharynx as per pre-operative assessment  ? ? ? ? ?

## 2021-08-24 NOTE — Evaluation (Signed)
Physical Therapy Evaluation ?Patient Details ?Name: Joshua Bowers ?MRN: [id number] ?DOB: 12/04/1950 ?Today's Date: 08/24/2021 ? ?History of Present Illness ? Pt is a 71yo male presenting s/p R-TKA on 08/24/21. PMH: OA, GERD, hx of bladder ca, HTN, L-TKA 2022  ?Clinical Impression ? Joshua Bowers is a 71 y.o. male POD 0 s/p R-TKA. Patient reports Independence with mobility at baseline. Patient is now limited by functional impairments (see PT problem list below) and requires min guard for transfers and gait with RW. Patient was able to ambulate 20 feet with RW and min guard assist. Patient instructed in exercise to facilitate ROM and circulation to manage edema. Patient will benefit from continued skilled PT interventions to address impairments and progress towards PLOF. Acute PT will follow to progress mobility and stair training in preparation for safe discharge home. ?   ?   ? ?Recommendations for follow up therapy are one component of a multi-disciplinary discharge planning process, led by the attending physician.  Recommendations may be updated based on patient status, additional functional criteria and insurance authorization. ? ?Follow Up Recommendations Follow physician's recommendations for discharge plan and follow up therapies ? ?  ?Assistance Recommended at Discharge Set up Supervision/Assistance  ?Patient can return home with the following ? A little help with walking and/or transfers;A little help with bathing/dressing/bathroom;Assistance with cooking/housework;Help with stairs or ramp for entrance;Assist for transportation ? ?  ?Equipment Recommendations None recommended by PT (Pt has recommended DME)  ?Recommendations for Other Services ?    ?  ?Functional Status Assessment Patient has had a recent decline in their functional status and demonstrates the ability to make significant improvements in function in a reasonable and predictable amount of time.  ? ?  ?Precautions / Restrictions  Precautions ?Precautions: Fall ?Restrictions ?Weight Bearing Restrictions: No  ? ?  ? ?Mobility ? Bed Mobility ?Overal bed mobility: Needs Assistance ?Bed Mobility: Supine to Sit ?  ?  ?Supine to sit: Min guard ?  ?  ?General bed mobility comments: Pt min guard for safety, no physical assist required. ?  ? ?Transfers ?Overall transfer level: Needs assistance ?Equipment used: Rolling walker (2 wheels) ?Transfers: Sit to/from Stand ?Sit to Stand: Min guard ?  ?  ?  ?  ?  ?General transfer comment: Pt min guard for safety only, no physical assist required. Moderate verbal cues for hand placement and pushing off of bed/chair during standing and not pulling up with RW; pt verbalized understanding but continued to pull up on RW. ?  ? ?Ambulation/Gait ?Ambulation/Gait assistance: Min guard ?Gait Distance (Feet): 20 Feet ?Assistive device: Rolling walker (2 wheels) ?Gait Pattern/deviations: Step-to pattern, Decreased step length - right, Decreased stance time - right, Knee flexed in stance - right ?Gait velocity: decreased ?  ?  ?General Gait Details: Pt ambulated with min guard assist for safety, no physical assist required. Demonstrated decreased stance time on R and reduced step length on R; feel pt is wary of weightbearing on operative side but it became more functional with increased distance. No overt LOB. ? ?Stairs ?  ?  ?  ?  ?  ? ?Wheelchair Mobility ?  ? ?Modified Rankin (Stroke Patients Only) ?  ? ?  ? ?Balance Overall balance assessment: Needs assistance ?Sitting-balance support: Feet supported, No upper extremity supported ?Sitting balance-Leahy Scale: Fair ?  ?  ?Standing balance support: Reliant on assistive device for balance, During functional activity, Bilateral upper extremity supported ?Standing balance-Leahy Scale: Poor ?  ?  ?  ?  ?  ?  ?  ?  ?  ?  ?  ?  ?   ? ? ? ?  Pertinent Vitals/Pain Pain Assessment ?Pain Assessment: 0-10 ?Pain Score: 7  ?Pain Location: R knee ?Pain Descriptors / Indicators:  Operative site guarding, Pins and needles, Burning, Sore ?Pain Intervention(s): Limited activity within patient's tolerance, Monitored during session, Repositioned, Ice applied  ? ? ?Home Living Family/patient expects to be discharged to:: Private residence ?Living Arrangements: Spouse/significant other ?Available Help at Discharge: Family;Available 24 hours/day ?Type of Home: House ?Home Access: Stairs to enter ?  ?Entrance Stairs-Number of Steps: 1 ?  ?Home Layout: One level ?Home Equipment: BSC/3in1;Rolling Walker (2 wheels) ?   ?  ?Prior Function Prior Level of Function : Independent/Modified Independent ?  ?  ?  ?  ?  ?  ?Mobility Comments: IND ?ADLs Comments: IND ?  ? ? ?Hand Dominance  ?   ? ?  ?Extremity/Trunk Assessment  ? Upper Extremity Assessment ?Upper Extremity Assessment: Overall WFL for tasks assessed ?  ? ?Lower Extremity Assessment ?Lower Extremity Assessment: RLE deficits/detail;LLE deficits/detail ?RLE Deficits / Details: MMT: ank PF/DF 4+/5, no extensor lag noted ?RLE Sensation: WNL ?LLE Deficits / Details: MMT: ank pf/df 4/5 ?LLE Sensation: WNL ?  ? ?Cervical / Trunk Assessment ?Cervical / Trunk Assessment: Normal  ?Communication  ? Communication: No difficulties  ?Cognition Arousal/Alertness: Awake/alert ?Behavior During Therapy: [facility] for tasks assessed/performed ?Overall Cognitive Status: Within Functional Limits for tasks assessed ?  ?  ?  ?  ?  ?  ?  ?  ?  ?  ?  ?  ?  ?  ?  ?  ?  ?  ?  ? ?  ?General Comments   ? ?  ?Exercises Total Joint Exercises ?Ankle Circles/Pumps: AROM, 20 reps, Both  ? ?Assessment/Plan  ?  ?PT Assessment Patient needs continued PT services  ?PT Problem List Decreased strength;Decreased range of motion;Decreased activity tolerance;Decreased balance;Decreased mobility;Decreased coordination;Decreased knowledge of use of DME;Pain ? ?   ?  ?PT Treatment Interventions DME instruction;Gait training;Stair training;Functional mobility training;Therapeutic  activities;Therapeutic exercise;Balance training;Patient/family education;Neuromuscular re-education   ? ?PT Goals (Current goals can be found in the Care Plan section)  ?Acute Rehab PT Goals ?Patient Stated Goal: get out of a chair without hurting ?PT Goal Formulation: With patient ?Time For Goal Achievement: 08/31/21 ?Potential to Achieve Goals: Good ? ?  ?Frequency 7X/week ?  ? ? ?Co-evaluation   ?  ?  ?  ?  ? ? ?  ?AM-PAC PT "6 Clicks" Mobility  ?Outcome Measure Help needed turning from your back to your side while in a flat bed without using bedrails?: A Little ?Help needed moving from lying on your back to sitting on the side of a flat bed without using bedrails?: A Little ?Help needed moving to and from a bed to a chair (including a wheelchair)?: A Little ?Help needed standing up from a chair using your arms (e.g., wheelchair or bedside chair)?: A Little ?Help needed to walk in hospital room?: A Little ?Help needed climbing 3-5 steps with a railing? : A Little ?6 Click Score: 18 ? ?  ?End of Session Equipment Utilized During Treatment: Gait belt ?Activity Tolerance: Patient tolerated treatment well ?Patient left: in chair;with call bell/phone within reach;with chair alarm set ?Nurse Communication: Mobility status ?PT Visit Diagnosis: Pain;Difficulty in walking, not elsewhere classified (R26.2) ?Pain - Right/Left: Right ?Pain - part of body: Knee ?  ? ?Time: 4431-5400 ?PT Time Calculation (min) (ACUTE ONLY): 33 min ? ? ?Charges:   PT Evaluation ?$PT Eval Low Complexity: 1 Low ?PT Treatments ?$Gait Training: 8-22  mins ?  ?   ? ? ?Coolidge Breeze, PT, DPT ?[facility] ?Office: [phone] ?Pager: [phone] ? ? ?Coolidge Breeze ?08/24/2021, 4:19 PM ? ?

## 2021-08-24 NOTE — Interval H&P Note (Signed)
History and Physical Interval Note: ? ?08/24/2021 ?8:19 AM ? ?Joshua Bowers  has presented today for surgery, with the diagnosis of Right knee degenerative joint disease.  The various methods of treatment have been discussed with the patient and family. After consideration of risks, benefits and other options for treatment, the patient has consented to  Procedure(s): ?TOTAL KNEE ARTHROPLASTY (Right) as a surgical intervention.  The patient's history has been reviewed, patient examined, no change in status, stable for surgery.  I have reviewed the patient's chart and labs.  Questions were answered to the patient's satisfaction.   ? ? ?Joshua Bowers ? ? ?

## 2021-08-24 NOTE — Anesthesia Procedure Notes (Signed)
Spinal ? ?Patient location during procedure: OR ?Start time: 08/24/2021 8:26 AM ?End time: 08/24/2021 8:29 AM ?Reason for block: surgical anesthesia ?Staffing ?Performed: resident/CRNA  ?Anesthesiologist: Finucane, Elizabeth M, DO ?Resident/CRNA: Walker, Karen L, CRNA ?Preanesthetic Checklist ?Completed: patient identified, IV checked, site marked, risks and benefits discussed, surgical consent, monitors and equipment checked, pre-op evaluation and timeout performed ?Spinal Block ?Patient position: sitting ?Prep: DuraPrep ?Patient monitoring: heart rate, cardiac monitor, continuous pulse ox and blood pressure ?Approach: midline ?Location: L3-4 ?Injection technique: single-shot ?Needle ?Needle type: Pencan and Introducer  ?Needle gauge: 24 G ?Needle length: 10 cm ?Assessment ?Sensory level: T4 ?Events: CSF return ?Additional Notes ?IV functioning, monitors applied to pt. Expiration date of kit checked and confirmed to be in date. Sterile prep and drape, hand hygiene and sterile gloved used. Pt was positioned and spine was prepped in sterile fashion. Skin was anesthetized with lidocaine. Free flow of clear CSF obtained prior to injecting local anesthetic into CSF. Spinal needle aspirated freely following injection. Needle was carefully withdrawn, and pt tolerated procedure well. Loss of motor and sensory on exam post injection.  ? ? ? ?

## 2021-08-24 NOTE — Brief Op Note (Signed)
08/24/2021 ? ?10:54 AM ? ?PATIENT:  Joshua Bowers  71 y.o. male ? ?PRE-OPERATIVE DIAGNOSIS:  Right knee degenerative joint disease ? ?POST-OPERATIVE DIAGNOSIS:  Right knee degenerative joint disease ? ?PROCEDURE:  Procedure(s): ?TOTAL KNEE ARTHROPLASTY (Right) ? ?SURGEON:  Surgeon(s) and Role: ?   [name], MD - Primary ? ?PHYSICIAN ASSISTANT:  ? ?ASSISTANTS: Bissell  ? ?ANESTHESIA:   spinal ? ?EBL:  50 mL  ? ?BLOOD ADMINISTERED:none ? ?DRAINS: none  ? ?LOCAL MEDICATIONS USED:  MARCAINE    ? ?SPECIMEN:  No Specimen ? ?DISPOSITION OF SPECIMEN:  N/A ? ?COUNTS:  YES ? ?TOURNIQUET:   ?Total Tourniquet Time Documented: ?Thigh (Right) - 73 minutes ?Total: Thigh (Right) - 73 minutes ? ? ?DICTATION: .Other Dictation: Dictation Number   [id number] ? ?PLAN OF CARE: Admit to inpatient  ? ?PATIENT DISPOSITION:  PACU - hemodynamically stable. ?  ?Delay start of Pharmacological VTE agent (>24hrs) due to surgical blood loss or risk of bleeding: no ? ?

## 2021-08-24 NOTE — Anesthesia Preprocedure Evaluation (Addendum)
Anesthesia Evaluation  ?Patient identified by MRN, date of birth, ID band ?Patient awake ? ? ? ?Reviewed: ?Allergy & Precautions, NPO status , Patient's Chart, lab work & pertinent test results ? ?Airway ?Mallampati: III ? ?TM Distance: >3 FB ?Neck ROM: Full ? ? ? Dental ? ?(+) Teeth Intact, Dental Advisory Given ?  ?Pulmonary ?neg pulmonary ROS,  ?  ?Pulmonary exam normal ?breath sounds clear to auscultation ? ? ? ? ? ? Cardiovascular ?hypertension (153/95 in preop), Pt. on medications ?Normal cardiovascular exam ?Rhythm:Regular Rate:Normal ? ? ?  ?Neuro/Psych ?negative neurological ROS ? negative psych ROS  ? GI/Hepatic ?Neg liver ROS, GERD  Controlled,  ?Endo/Other  ?negative endocrine ROS ? Renal/GU ?Renal InsufficiencyRenal diseaseCr 1.39  ?negative genitourinary ?  ?Musculoskeletal ?negative musculoskeletal ROS ?(+)  ? Abdominal ?  ?Peds ?negative pediatric ROS ?(+)  Hematology ? ?(+) Blood dyscrasia, , Hb 17.7   ?Anesthesia Other Findings ? ? Reproductive/Obstetrics ?negative OB ROS ? ?  ? ? ? ? ? ? ? ? ? ? ? ? ? ?  ?  ? ? ? ? ? ? ? ?Anesthesia Physical ?Anesthesia Plan ? ?ASA: 2 ? ?Anesthesia Plan: Spinal, Regional and MAC  ? ?Post-op Pain Management: Ofirmev IV (intra-op)*  ? ?Induction:  ? ?PONV Risk Score and Plan: 2 and Propofol infusion and TIVA ? ?Airway Management Planned: Natural Airway and Nasal Cannula ? ?Additional Equipment: None ? ?Intra-op Plan:  ? ?Post-operative Plan:  ? ?Informed Consent: I have reviewed the patients History and Physical, chart, labs and discussed the procedure including the risks, benefits and alternatives for the proposed anesthesia with the patient or authorized representative who has indicated his/her understanding and acceptance.  ? ? ? ? ? ?Plan Discussed with: CRNA ? ?Anesthesia Plan Comments: (TKR 06/2021 under spinal no issues)  ? ? ? ? ? ? ?Anesthesia Quick Evaluation ? ?

## 2021-08-24 NOTE — Discharge Instructions (Signed)
Elevate leg above heart 6x a day for 12mnutes each ?Use knee immobilizer while walking until can SLR x 10 ?Use knee immobilizer in bed to keep knee in extension ?Aquacel dressing may remain in place until follow up. May shower with aquacel dressing in place. If the dressing becomes saturated or peels off, you may remove aquacel dressing. Do not remove steri-strips if they are present. Place new dressing with gauze and tape or ACE bandage which should be kept clean and dry and changed daily.  ? ?INSTRUCTIONS AFTER JOINT REPLACEMENT  ? ?Remove items at home which could result in a fall. This includes throw rugs or furniture in walking pathways ?ICE to the affected joint every three hours while awake for 30 minutes at a time, for at least the first 3-5 days, and then as needed for pain and swelling.  Continue to use ice for pain and swelling. You may notice swelling that will progress down to the foot and ankle.  This is normal after surgery.  Elevate your leg when you are not up walking on it.   ?Continue to use the breathing machine you got in the hospital (incentive spirometer) which will help keep your temperature down.  It is common for your temperature to cycle up and down following surgery, especially at night when you are not up moving around and exerting yourself.  The breathing machine keeps your lungs expanded and your temperature down. ? ? ?DIET:  As you were doing prior to hospitalization, we recommend a well-balanced diet. ? ?DRESSING / WOUND CARE / SHOWERING ? ?Keep the surgical dressing until follow up.  The dressing is water proof, so you can shower without any extra covering.  IF THE DRESSING FALLS OFF or the wound gets wet inside, change the dressing with sterile gauze.  Please use good hand washing techniques before changing the dressing.  Do not use any lotions or creams on the incision until instructed by your surgeon.   ? ?ACTIVITY ? ?Increase activity slowly as tolerated, but follow the weight  bearing instructions below.   ?No driving for 6 weeks or until further direction given by your physician.  You cannot drive while taking narcotics.  ?No lifting or carrying greater than 10 lbs. until further directed by your surgeon. ?Avoid periods of inactivity such as sitting longer than an hour when not asleep. This helps prevent blood clots.  ?You may return to work once you are authorized by your doctor.  ? ? ? ?WEIGHT BEARING  ? ?Weight bearing as tolerated with assist device (walker, cane, etc) as directed, use it as long as suggested by your surgeon or therapist, typically at least 4-6 weeks. ? ? ?EXERCISES ? ?Results after joint replacement surgery are often greatly improved when you follow the exercise, range of motion and muscle strengthening exercises prescribed by your doctor. Safety measures are also important to protect the joint from further injury. Any time any of these exercises cause you to have increased pain or swelling, decrease what you are doing until you are comfortable again and then slowly increase them. If you have problems or questions, call your caregiver or physical therapist for advice.  ? ?Rehabilitation is important following a joint replacement. After just a few days of immobilization, the muscles of the leg can become weakened and shrink (atrophy).  These exercises are designed to build up the tone and strength of the thigh and leg muscles and to improve motion. Often times heat used for twenty to thirty  minutes before working out will loosen up your tissues and help with improving the range of motion but do not use heat for the first two weeks following surgery (sometimes heat can increase post-operative swelling).  ? ?These exercises can be done on a training (exercise) mat, on the floor, on a table or on a bed. Use whatever works the best and is most comfortable for you.    Use music or television while you are exercising so that the exercises are a pleasant break in your day.  This will make your life better with the exercises acting as a break in your routine that you can look forward to.   Perform all exercises about fifteen times, three times per day or as directed.  You should exercise both the operative leg and the other leg as well. ? ?Exercises include: ?  ?Quad Sets - Tighten up the muscle on the front of the thigh (Quad) and hold for 5-10 seconds.   ?Straight Leg Raises - With your knee straight (if you were given a brace, keep it on), lift the leg to 60 degrees, hold for 3 seconds, and slowly lower the leg.  Perform this exercise against resistance later as your leg gets stronger.  ?Leg Slides: Lying on your back, slowly slide your foot toward your buttocks, bending your knee up off the floor (only go as far as is comfortable). Then slowly slide your foot back down until your leg is flat on the floor again.  ?Angel Wings: Lying on your back spread your legs to the side as far apart as you can without causing discomfort.  ?Hamstring Strength:  Lying on your back, push your heel against the floor with your leg straight by tightening up the muscles of your buttocks.  Repeat, but this time bend your knee to a comfortable angle, and push your heel against the floor.  You may put a pillow under the heel to make it more comfortable if necessary.  ? ?A rehabilitation program following joint replacement surgery can speed recovery and prevent re-injury in the future due to weakened muscles. Contact your doctor or a physical therapist for more information on knee rehabilitation.  ? ? ?CONSTIPATION ? ?Constipation is defined medically as fewer than three stools per week and severe constipation as less than one stool per week.  Even if you have a regular bowel pattern at home, your normal regimen is likely to be disrupted due to multiple reasons following surgery.  Combination of anesthesia, postoperative narcotics, change in appetite and fluid intake all can affect your bowels.  ? ?YOU MUST  use at least one of the following options; they are listed in order of increasing strength to get the job done.  They are all available over the counter, and you may need to use some, POSSIBLY even all of these options:   ? ?Drink plenty of fluids (prune juice may be helpful) and high fiber foods ?Colace 100 mg by mouth twice a day  ?Senokot for constipation as directed and as needed Dulcolax (bisacodyl), take with full glass of water  ?Miralax (polyethylene glycol) once or twice a day as needed. ? ?If you have tried all these things and are unable to have a bowel movement in the first 3-4 days after surgery call either your surgeon or your primary doctor.   ? ?If you experience loose stools or diarrhea, hold the medications until you stool forms back up.  If your symptoms do not get better within  1 week or if they get worse, check with your doctor.  If you experience "the worst abdominal pain ever" or develop nausea or vomiting, please contact the office immediately for further recommendations for treatment. ? ? ?ITCHING:  If you experience itching with your medications, try taking only a single pain pill, or even half a pain pill at a time.  You can also use Benadryl over the counter for itching or also to help with sleep.  ? ?TED HOSE STOCKINGS:  Use stockings on both legs until for at least 2 weeks or as directed by physician office. They may be removed at night for sleeping. ? ?MEDICATIONS:  See your medication summary on the ?After Visit Summary? that nursing will review with you.  You may have some home medications which will be placed on hold until you complete the course of blood thinner medication.  It is important for you to complete the blood thinner medication as prescribed. ? ?PRECAUTIONS:  If you experience chest pain or shortness of breath - call 911 immediately for transfer to the hospital emergency department.  ? ?If you develop a fever greater that 101 F, purulent drainage from wound, increased  redness or drainage from wound, foul odor from the wound/dressing, or calf pain - CONTACT YOUR SURGEON.   ?                                                ?FOLLOW-UP APPOINTMENTS:  If you do not alread

## 2021-08-24 NOTE — Plan of Care (Signed)
?  Problem: Activity: ?Goal: Risk for activity intolerance will decrease ?Outcome: Progressing ?  ?Problem: Safety: ?Goal: Ability to remain free from injury will improve ?Outcome: Progressing ?  ?Problem: Pain Managment: ?Goal: General experience of comfort will improve ?Outcome: Progressing ?  ?

## 2021-08-25 ENCOUNTER — Encounter (HOSPITAL_COMMUNITY): Payer: Self-pay | Admitting: Specialist

## 2021-08-25 LAB — BASIC METABOLIC PANEL
Anion gap: 8 (ref 5–15)
BUN: 16 mg/dL (ref 8–23)
CO2: 25 mmol/L (ref 22–32)
Calcium: 8.5 mg/dL — ABNORMAL LOW (ref 8.9–10.3)
Chloride: 105 mmol/L (ref 98–111)
Creatinine, Ser: 1.04 mg/dL (ref 0.61–1.24)
GFR, Estimated: >60 mL/min (ref >60)
Glucose, Bld: 124 mg/dL — ABNORMAL HIGH (ref 70–99)
Potassium: 4 mmol/L (ref 3.5–5.1)
Sodium: 138 mmol/L (ref 135–145)

## 2021-08-25 LAB — CBC
HCT: 42.5 % (ref 39.0–52.0)
Hemoglobin: 14.9 g/dL (ref 13.0–17.0)
MCH: 31.6 pg (ref 26.0–34.0)
MCHC: 35.1 g/dL (ref 30.0–36.0)
MCV: 90.2 fL (ref 80.0–100.0)
Platelets: 287 10*3/uL (ref 150–400)
RBC: 4.71 MIL/uL (ref 4.22–5.81)
RDW: 13.4 % (ref 11.5–15.5)
WBC: 21.6 10*3/uL — ABNORMAL HIGH (ref 4.0–10.5)
nRBC: 0 % (ref 0.0–0.2)

## 2021-08-25 NOTE — Op Note (Signed)
NAME: Joshua Bowers, Joshua Bowers ?MEDICAL RECORD NO: [id number] ?ACCOUNT NO: [id number] ?DATE OF BIRTH: [birth year] ?FACILITY: [facility] ?LOCATION: [facility] ?PHYSICIAN: [name], MD ? ?Operative Report  ? ?DATE OF PROCEDURE: 08/24/2021 ? ? ?PREOPERATIVE DIAGNOSES:  End-stage osteoarthritis of the right knee. ? ?POSTOPERATIVE DIAGNOSES:  End-stage osteoarthritis of the right knee. ? ?PROCEDURE PERFORMED:  Right total knee arthroplasty utilizing Attune rotating platform 7 femur, 7 tibia, 6 mm insert, 38 patella. ? ?ANESTHESIA:  Spinal. ? ?ASSISTANT:  Lacie Draft, PA  ? ?HISTORY:  A 71 year old with end-stage osteoarthrosis symptomatic of the right knee, bone-on-bone, negatively affecting his activities of daily living.  Failing conservative treatment indicated for replacement of the degenerated joint.  Risks and  ?benefits discussed including bleeding, infection, damage to neurovascular structures, no change in symptoms, worsening symptoms, DVT, PE, anesthetic complications, etc. ? ?DESCRIPTION OF PROCEDURE:  With the patient in supine position after induction of adequate spinal anesthesia, 2 grams Kefzol, the right lower extremity was prepped and draped and exsanguinated in the usual sterile fashion.  Thigh tourniquet inflated to  ?225 mmHg.  Midline incision was then made over the knee.  Full thickness flaps were developed.  Median parapatellar arthrotomy performed.  Patella was everted, knee was flexed gently.  Tricompartmental osteoarthrosis was noted, bone-on-bone medial  ?compartment.  Elevated soft tissues medially, preserving the MCL.  Leksell rongeur was utilized to fashion a notch just above the femoral notch.  Has an entry point into the femoral canal.  I then drilled to gain access and then inserted after irrigation ? gently a T-handle reamer followed by intramedullary guide without difficulty.  5-degree left, 9 off the distal femur was selected.  This was pinned and the distal femoral cut was then performed, sized  off the anterior cortex to a 7.  This was pinned in  ?3 degrees of external rotation.  Distal femoral cutting block was applied and the anterior, posterior and chamfer cuts were then performed.  Soft tissue protected posteriorly at all times.  Subluxed the tibia.  Remnants of medial and lateral menisci were ? removed as well as the ACL.  Eburnated bone was noted.  The medial femoral condyle, medial tibial plateau and lateral tibial plateau.  Using external alignment guide, 2 off the defect was posteromedially bisecting tibiotalar joint 3-degree slope  ?parallel to the shaft.  This was pinned.  I performed a proximal tibial cut.  I then tested our extension gap and flexion gap.  It was equivalent with a 6 mm insert.  Next I resubluxed the tibia, sized it to a 7.  Maximizing the coverage just to the  ?medial aspect of the tibial tubercle.  This was then pinned, harvested bone centrally and impacted it in distal femur.  I then removed Eburnated bone medially from the pin with a Beyer rongeur and then I impacted the pin.  Following this, I turned  ?attention to the femur for the box cut.  We used a box cut jig, placed centrally bisecting the canal.  This was pinned.  I performed a box cut eburnated and very hard bone was noted throughout.  After a box cut was performed, I then placed a trial femur. ?  I slightly sat up.  I removed that and removed additional bone with a Beyer rongeur.  I replaced our distal femoral cutting block and recut the nerve bevels and anterior and posterior cuts.  Extra bone was taken from the eburnated bone anteriorly.  After  ?this, the trial femur fit  flush.  I drilled the lug holes.  I then placed a 6 mm insert after impacting the trial femur and I had full extension, full flexion, good stability to varus valgus stressing at 0 and 30 degrees, negative anterior drawer.  Next, ? I everted the patella, measured it to a 25, planned it to 16 utilizing the patellar jig.  I then sized it to a 38  medializing our peg holes.  These were then drilled.  I then placed a trial 38 patella and reduced it, had excellent patellofemoral  ?tracking.  Full extension and full flexion and good stability. ? ?Next, all instrumentation was removed.  Pulsatile lavage used to clean the bony surfaces after I checked posteriorly, removed any remnants of the menisci.  Capsule and popliteus was intact.  Cauterized geniculates with the Aquamantys.  The knee was then  ?flexed.  Tibia subluxed, all surfaces thoroughly dried.  Mixed cement on back table in appropriate fashion.  I drilled holes through the eburnated bone on the medial tibial plateau.  I then placed cement in the proximal tibia, digitally pressurizing it.  ? Cement was placed on the tibial component.  I then inserted and impacted the tibial component.  Redundant cement removed, I cemented and impacted the femur.  Redundant cement removed.  I placed a 6 trial insert, reduced it, held in axial load throughout ? the curing of the cement.  I cemented and clamped the patella.  Marcaine with epinephrine was placed into the joint during curing of the patella as well as Prontosan.  Next, after appropriate curing of the cement, tourniquet was deflated at 72 minutes.  ? Revascularization was noted.  Minimal bleeding was cauterized.  We meticulously removed all redundant cement with osteotome.  Good flexion, good extension and good stability to varus valgus stressing at 0 and 30 degrees.  Negative anterior drawer.   ?Flexed the knee, everted the patella.  Trial removed, again removed redundant cement.  Copiously irrigated with pulsatile lavage followed by Prontosan.  Six permanent insert was selected and placed it, reduced it and had full extension, full flexion.   ?Again, good stability to testing.  Next, in slight flexion the patellar arthrotomy was repaired with 1 Vicryl in interrupted figure-of-eight sutures followed by running Stratafix.  Following this, I had excellent  patellofemoral tracking and excellent  ?stability.  Copiously irrigated the subcutaneous tissues once again.  This was followed by subcutaneous with 2-0, skin with staples.  Wound was dressed sterilely.  Placed in immobilizer, transported to the recovery room in satisfactory condition.  ? ?The patient tolerated the procedure well.  No complications.  ? ?ASSISTANT:  Lacie Draft, PA ? ?Blood loss was 30 mL.  ? ?Of note, initially following the initial incision through the subcutaneous tissue, there appeared to be a branch of the infrapatellar portion of the saphenous nerve that crossed the patellar ligament and the incision site and extended medially crossing the region of the intended arthrotomy.  This would have precluded placement  ?of the prosthesis and proceeding with the surgery.  I therefore used a new 10 blade.  Resected portion the nerve proximal medially in the soft tissues and then also the same laterally.  Both ends were placed deep into the soft tissues. ? ? ? ?[place] ?D: 08/24/2021 11:04:24 am T: 08/25/2021 1:41:00 am  ?JOB: [id number]/ [id number]  ?

## 2021-08-25 NOTE — Progress Notes (Signed)
Physical Therapy Treatment ?Patient Details ?Name: Joshua Bowers ?MRN: [id number] ?DOB: August 10, 1950 ?Today's Date: 08/25/2021 ? ? ?History of Present Illness Pt is a 71yo male presenting s/p R-TKA on 08/24/21. PMH: OA, GERD, hx of bladder ca, HTN, L-TKA 2022 ? ?  ?PT Comments  ? ? Pt is slowly progress toward acute PT goals this session with progression to stair training, HEP, and slight increase in ambulation distance. Pt ambulated ~24f with supervision, no LOB observed. Pt performed safe stair negotiation with supervision with use of RW for UE support and cues for sequencing and safe hand placement. Pt educated on correct positioning of family members when assisting at home. Pt limited with further ambulation distance following stair negotiation due to pain, onset of dizziness and nausea during session (see mobility section- general stairs comments). PT reviewed LE HEP, pt demonstrated understanding. Pt will benefit from continued skilled PT to increase their independence and maximize safety with mobility.  ?   ?Recommendations for follow up therapy are one component of a multi-disciplinary discharge planning process, led by the attending physician.  Recommendations may be updated based on patient status, additional functional criteria and insurance authorization. ? ?Follow Up Recommendations ? Follow physician's recommendations for discharge plan and follow up therapies ?  ?  ?Assistance Recommended at Discharge Set up Supervision/Assistance  ?Patient can return home with the following A little help with walking and/or transfers;A little help with bathing/dressing/bathroom;Assistance with cooking/housework;Help with stairs or ramp for entrance;Assist for transportation ?  ?Equipment Recommendations ? None recommended by PT (Pt has recommended DME)  ?  ?Recommendations for Other Services   ? ? ?  ?Precautions / Restrictions Precautions ?Precautions: Fall ?Restrictions ?Weight Bearing Restrictions: No  ?  ? ?Mobility ?  Bed Mobility ?Overal bed mobility: Needs Assistance ?Bed Mobility: Supine to Sit, Sit to Supine ?  ?  ?Supine to sit: Supervision ?Sit to supine: Supervision ?  ?General bed mobility comments: supervision for safety, minimal use of bed rails ?  ? ?Transfers ?Overall transfer level: Needs assistance ?Equipment used: Rolling walker (2 wheels) ?Transfers: Sit to/from Stand ?Sit to Stand: Supervision ?  ?  ?  ?  ?  ?General transfer comment: Supervision for safety, cues for proper hand placement but pt still using B UEs on RW for power up. staggered standce withL knee behind to assist with standing from EOB. ?  ? ?Ambulation/Gait ?Ambulation/Gait assistance: Supervision ?Gait Distance (Feet): 34 Feet ?Assistive device: Rolling walker (2 wheels) ?Gait Pattern/deviations: Step-to pattern, Decreased step length - right, Decreased stance time - right, Knee flexed in stance - right ?Gait velocity: decreased ?  ?  ?General Gait Details: Pt ambulated with supervision for safety, no physical assist required. Demonstrated decreased stance time on R and reduced step length with R knee flexed. No overt LOB. ? ? ?Stairs ?Stairs: Yes ?Stairs assistance: Supervision ?Stair Management: No rails, Forwards, With walker ?Number of Stairs: 1 ?General stair comments: reported dizziness following stair training, assisted back to supine. Vitals 119/76, 72bpm, 97% O2. Pt performing ther ex in bed with reports of "not feeling right" and onset of nausea. Vitals taken again reading 104/70, 67bpm, 97% O2 sat. [facility] 105/73, 92%, 69bpm, pt reports improved symptoms, stated BP readings are low for his baseline. Vitals end of session HOB elevated  20deg 117/75, 68bpm, 92%. ? ? ?Wheelchair Mobility ?  ? ?Modified Rankin (Stroke Patients Only) ?  ? ? ?  ?Balance Overall balance assessment: Needs assistance ?Sitting-balance support:  Feet supported, No upper extremity supported ?Sitting balance-Leahy Scale: Fair ?  ?  ?Standing  balance support: Reliant on assistive device for balance, During functional activity, Bilateral upper extremity supported ?Standing balance-Leahy Scale: Poor ?  ?  ?  ?  ?  ?  ?  ?  ?  ?  ?  ?  ?  ? ?  ?Cognition Arousal/Alertness: Awake/alert ?Behavior During Therapy: [facility] for tasks assessed/performed ?Overall Cognitive Status: Within Functional Limits for tasks assessed ?  ?  ?  ?  ?  ?  ?  ?  ?  ?  ?  ?  ?  ?  ?  ?  ?  ?  ?  ? ?  ?Exercises Total Joint Exercises ?Ankle Circles/Pumps: AROM, 20 reps, Both ?Quad Sets: AROM, Right, 10 reps, Supine ?Short Arc Quad: AROM, Right, 10 reps, Supine ?Heel Slides: AROM, Right, 5 reps, Supine, Limitations ?Heel Slides Limitations: limited reps due to pain and feeling of nausea ?Hip ABduction/ADduction: AROM, Right, 10 reps, Supine ?Straight Leg Raises: AROM, Right, 10 reps, Supine ?Long Arc Quad: AROM, Right, 10 reps, Supine ? ?  ?General Comments   ?  ?  ? ?Pertinent Vitals/Pain Pain Assessment ?Pain Assessment: 0-10 ?Pain Score: 7  ?Pain Location: R knee ?Pain Descriptors / Indicators: Sore, Sharp ?Pain Intervention(s): Limited activity within patient's tolerance, Monitored during session, Repositioned, Ice applied  ? ? ?Home Living   ?  ?  ?  ?  ?  ?  ?  ?  ?  ?   ?  ?Prior Function    ?  ?  ?   ? ?PT Goals (current goals can now be found in the care plan section) Acute Rehab PT Goals ?Patient Stated Goal: get out of a chair without hurting ?PT Goal Formulation: With patient ?Time For Goal Achievement: 08/31/21 ?Potential to Achieve Goals: Good ?Progress towards PT goals: Progressing toward goals ? ?  ?Frequency ? ? ? 7X/week ? ? ? ?  ?PT Plan Current plan remains appropriate  ? ? ?Co-evaluation   ?  ?  ?  ?  ? ?  ?AM-PAC PT "6 Clicks" Mobility   ?Outcome Measure ? Help needed turning from your back to your side while in a flat bed without using bedrails?: A Little ?Help needed moving from lying on your back to sitting on the side of a flat bed without using bedrails?: A  Little ?Help needed moving to and from a bed to a chair (including a wheelchair)?: A Little ?Help needed standing up from a chair using your arms (e.g., wheelchair or bedside chair)?: A Little ?Help needed to walk in hospital room?: A Little ?Help needed climbing 3-5 steps with a railing? : A Little ?6 Click Score: 18 ? ?  ?End of Session Equipment Utilized During Treatment: Gait belt ?Activity Tolerance: Treatment limited secondary to medical complications (Comment) (onset of dizziness/nausea) ?Patient left: with call bell/phone within reach;in bed;with bed alarm set ?Nurse Communication: Mobility status;Other (comment) (pt symptoms during session) ?PT Visit Diagnosis: Pain;Difficulty in walking, not elsewhere classified (R26.2);Other abnormalities of gait and mobility (R26.89) ?Pain - Right/Left: Right ?Pain - part of body: Knee ?  ? ? ?Time: 3664-4034 ?PT Time Calculation (min) (ACUTE ONLY): 38 min ? ?Charges:  $Therapeutic Exercise: 8-22 mins ?$Therapeutic Activity: 23-37 mins          ?          ? ?Festus Barren., PT, DPT  ?Acute Rehabilitation Services  ?Office  [phone] ? ? ?08/25/2021, 10:56 AM ? ?

## 2021-08-25 NOTE — Plan of Care (Signed)
Problem: Education: ?Goal: Knowledge of General Education information will improve ?Description: Including pain rating scale, medication(s)/side effects and non-pharmacologic comfort measures ?Outcome: Progressing ?  ?Problem: Health Behavior/Discharge Planning: ?Goal: Ability to manage health-related needs will improve ?Outcome: Progressing ?  ?Problem: Clinical Measurements: ?Goal: Ability to maintain clinical measurements within normal limits will improve ?Outcome: Progressing ?  ?Ivan Anchors, RN ?08/25/21 ?8:20 AM ? ?

## 2021-08-25 NOTE — Progress Notes (Addendum)
Subjective: ?1 Day Post-Op Procedure(s) (LRB): ?TOTAL KNEE ARTHROPLASTY (Right) ?Patient reports pain as moderate.   ? ?Patient has complaints of knee pain ? We will start therapy today. Plan is to go home after hospital stay. ? ?Objective: ?Vital signs in last 24 hours: ?Temp:  [97.7 ?F (36.5 ?C)-98.5 ?F (36.9 ?C)] 98.4 ?F (36.9 ?C) (04/06 1317) ?Pulse Rate:  [61-79] 74 (04/06 1317) ?Resp:  [14-17] 17 (04/06 1317) ?BP: (128-153)/(80-93) 135/86 (04/06 1317) ?SpO2:  [94 %-97 %] 95 % (04/06 1317) ? ?Intake/Output from previous day: ? ?Intake/Output Summary (Last 24 hours) at 08/25/2021 1420 ?Last data filed at 08/25/2021 1131 ?Gross per 24 hour  ?Intake 1710.01 ml  ?Output 1725 ml  ?Net -14.99 ml  ?  ?Intake/Output this shift: ?Total I/O ?In: -  ?Out: 125 [Urine:125] ? ?Labs: ?Results for orders placed or performed during the hospital encounter of 08/24/21  ?Surgical PCR Screen  ? Specimen: Nasal Mucosa; Nasal Swab  ?Result Value Ref Range  ? MRSA, PCR NEGATIVE NEGATIVE  ? Staphylococcus aureus NEGATIVE NEGATIVE  ?CBC  ?Result Value Ref Range  ? WBC 21.6 (H) 4.0 - 10.5 K/uL  ? RBC 4.71 4.22 - 5.81 MIL/uL  ? Hemoglobin 14.9 13.0 - 17.0 g/dL  ? HCT 42.5 39.0 - 52.0 %  ? MCV 90.2 80.0 - 100.0 fL  ? MCH 31.6 26.0 - 34.0 pg  ? MCHC 35.1 30.0 - 36.0 g/dL  ? RDW 13.4 11.5 - 15.5 %  ? Platelets 287 150 - 400 K/uL  ? nRBC 0.0 0.0 - 0.2 %  ?Basic metabolic panel  ?Result Value Ref Range  ? Sodium 138 135 - 145 mmol/L  ? Potassium 4.0 3.5 - 5.1 mmol/L  ? Chloride 105 98 - 111 mmol/L  ? CO2 25 22 - 32 mmol/L  ? Glucose, Bld 124 (H) 70 - 99 mg/dL  ? BUN 16 8 - 23 mg/dL  ? Creatinine, Ser 1.04 0.61 - 1.24 mg/dL  ? Calcium 8.5 (L) 8.9 - 10.3 mg/dL  ? GFR, Estimated >60 >60 mL/min  ? Anion gap 8 5 - 15  ? ? ?Exam - Neurologically intact ?ABD soft ?Neurovascular intact ?Sensation intact distally ?Intact pulses distally ?Dorsiflexion/Plantar flexion intact ?Incision: dressing C/D/I and no drainage ?No cellulitis present ?Compartment  soft ? ?Dressing - clean, dry, no drainage ?Motor function intact - moving foot and toes well on exam.  ?No calf pain or sign of DVT ? ?Assessment/Plan: ?1 Day Post-Op Procedure(s) (LRB): ?TOTAL KNEE ARTHROPLASTY (Right) ? ?Advance diet ?Up with therapy ?D/C IV fluids ?Past Medical History:  ?Diagnosis Date  ? 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 Bladder outlet obstruction   ? BPH (benign prostatic hyperplasia)   ? Elevated PSA   ? per pt 2013  prostate bx negative for cancer  ? Exposure to asbestos   ? per pt followed by a law firm in [place],  told last CT 2018 okay  ? GERD (gastroesophageal reflux disease)   ? History of bladder cancer   ? per pt 2011-- s/p  TURBT superficial per pt  ? Hypertension   ? Incomplete right bundle branch block   ? Lower urinary tract symptoms (LUTS)   ? ? ?DVT Prophylaxis -ASA Protocol ?Weight-Bearing as tolerated to Right leg ?No vaccines ?Plan d/c tomorrow ?Patient bundled - social work /case mgr to arrange 1 week of home health prior to outpt PT ?Seen by Dr Tonita Cong today ? ?Anticipated LOS equal to or greater than 2 midnights  due to ?- Age 56 and older with one or more of the following: ? - Obesity ? - Expected need for hospital services (PT, OT, Nursing) required for safe  discharge ? - Anticipated need for postoperative skilled nursing care or inpatient rehab ? - Active co-morbidities: Chronic pain requiring opiods ?OR  ? ?- Unanticipated findings during/Post Surgery: Slow post-op progression: GI, pain control, mobility  ?- Patient is a high risk of re-admission due to: None  ? ?[name] ?08/25/2021, 2:20 PM ? ?  ?

## 2021-08-25 NOTE — Progress Notes (Signed)
Physical Therapy Treatment ?Patient Details ?Name: Joshua Bowers ?MRN: [id number] ?DOB: 09-13-1950 ?Today's Date: 08/25/2021 ? ? ?History of Present Illness Pt is a 71yo male presenting s/p R-TKA on 08/24/21. PMH: OA, GERD, hx of bladder ca, HTN, L-TKA 2022 ? ?  ?PT Comments  ? ? Pt is slowly progressing toward acute PT goals this session with increased ambulation distance. Pt ambulated ~64f with supervision and use of RW, no LOB observed. Pt performed safe stair negotiation with supervision and MIN A for RW stability and cues for sequencing and safe hand placement. Pt's wife demonstrated safe guarding technique following cues from therapist.  PT reviewed LE HEP, pt demonstrated understanding- limited with there ex repetitions due to pain. Pt will benefit from continued skilled PT to increase their independence and maximize safety with mobility.  ?   ?Recommendations for follow up therapy are one component of a multi-disciplinary discharge planning process, led by the attending physician.  Recommendations may be updated based on patient status, additional functional criteria and insurance authorization. ? ?Follow Up Recommendations ? Follow physician's recommendations for discharge plan and follow up therapies ?  ?  ?Assistance Recommended at Discharge Set up Supervision/Assistance  ?Patient can return home with the following A little help with walking and/or transfers;A little help with bathing/dressing/bathroom;Assistance with cooking/housework;Help with stairs or ramp for entrance;Assist for transportation ?  ?Equipment Recommendations ? None recommended by PT (Pt has recommended DME)  ?  ?Recommendations for Other Services   ? ? ?  ?Precautions / Restrictions Precautions ?Precautions: Fall ?Restrictions ?Weight Bearing Restrictions: No  ?  ? ?Mobility ? Bed Mobility ?Overal bed mobility: Needs Assistance ?Bed Mobility: Supine to Sit, Sit to Supine ?  ?  ?Supine to sit: Supervision ?Sit to supine: Min guard ?   ?General bed mobility comments: supervision for safety, minimal use of bed rails. MIN guard for R LE back into bed. Reports feeling "weird" BP 158/101, HR 78bpm. ?  ? ?Transfers ?Overall transfer level: Needs assistance ?Equipment used: Rolling walker (2 wheels) ?Transfers: Sit to/from Stand ?Sit to Stand: Supervision ?  ?  ?  ?  ?  ?General transfer comment: Supervision for safety, cues for proper hand placement.  staggered stance with L knee behind to assist with standing from EOB. ?  ? ?Ambulation/Gait ?Ambulation/Gait assistance: Supervision ?Gait Distance (Feet): 70 Feet ?Assistive device: Rolling walker (2 wheels) ?Gait Pattern/deviations: Step-to pattern, Decreased step length - right, Decreased stance time - right, Knee flexed in stance - right ?Gait velocity: decreased ?  ?  ?General Gait Details: Pt ambulated with supervision for safety, no physical assist required. Demonstrated decreased stance time on R and reduced step length with R knee flexed. No overt LOB. Limited with ambulation, pt requesting to retun to room. BP following activity 142/92, HR 79bpm. ? ? ?Stairs ?Stairs: Yes ?Stairs assistance: Supervision ?Stair Management: No rails, Forwards, With walker ?Number of Stairs: 1 ?General stair comments: Cues provided for sequencing, wife present and educated on safe guarding position when assisting at home, able to demonstrate proper technique with cues from therapist. ? ? ?Wheelchair Mobility ?  ? ?Modified Rankin (Stroke Patients Only) ?  ? ? ?  ?Balance Overall balance assessment: Needs assistance ?Sitting-balance support: Feet supported, No upper extremity supported ?Sitting balance-Leahy Scale: Fair ?  ?  ?Standing balance support: Reliant on assistive device for balance, During functional activity, Bilateral upper extremity supported ?Standing balance-Leahy Scale: Poor ?  ?  ?  ?  ?  ?  ?  ?  ?  ?  ?  ?  ?  ? ?  ?  Cognition Arousal/Alertness: Awake/alert ?Behavior During Therapy: [facility] for tasks  assessed/performed ?Overall Cognitive Status: Within Functional Limits for tasks assessed ?  ?  ?  ?  ?  ?  ?  ?  ?  ?  ?  ?  ?  ?  ?  ?  ?  ?  ?  ? ?  ?Exercises Total Joint Exercises ?Ankle Circles/Pumps: AROM, 20 reps, Both ?Quad Sets: AROM, Right, 10 reps, Supine ?Short Arc Quad: AROM, Right, 10 reps, Supine ?Heel Slides: Right, 5 reps, Supine, Limitations, AAROM ?Heel Slides Limitations: limited reps due to pain and feeling of nausea ?Hip ABduction/ADduction: AROM, Right, 10 reps, Supine ?Straight Leg Raises: AROM, Right, 10 reps, Supine ?Long Arc Quad: AROM, Right, 10 reps, Supine ? ?  ?General Comments   ?  ?  ? ?Pertinent Vitals/Pain Pain Assessment ?Pain Assessment: 0-10 ?Pain Score: 8  ?Pain Location: R knee ?Pain Descriptors / Indicators: Sore, Sharp ?Pain Intervention(s): Limited activity within patient's tolerance, Monitored during session, Premedicated before session (RN heading in to apply ice pack)  ? ? ?Home Living   ?  ?  ?  ?  ?  ?  ?  ?  ?  ?   ?  ?Prior Function    ?  ?  ?   ? ?PT Goals (current goals can now be found in the care plan section) Acute Rehab PT Goals ?Patient Stated Goal: get out of a chair without hurting ?PT Goal Formulation: With patient ?Time For Goal Achievement: 08/31/21 ?Potential to Achieve Goals: Good ?Progress towards PT goals: Progressing toward goals ? ?  ?Frequency ? ? ? 7X/week ? ? ? ?  ?PT Plan Current plan remains appropriate  ? ? ?Co-evaluation   ?  ?  ?  ?  ? ?  ?AM-PAC PT "6 Clicks" Mobility   ?Outcome Measure ? Help needed turning from your back to your side while in a flat bed without using bedrails?: A Little ?Help needed moving from lying on your back to sitting on the side of a flat bed without using bedrails?: A Little ?Help needed moving to and from a bed to a chair (including a wheelchair)?: A Little ?Help needed standing up from a chair using your arms (e.g., wheelchair or bedside chair)?: A Little ?Help needed to walk in hospital room?: A Little ?Help  needed climbing 3-5 steps with a railing? : A Little ?6 Click Score: 18 ? ?  ?End of Session Equipment Utilized During Treatment: Gait belt ?Activity Tolerance: Patient limited by pain;Patient tolerated treatment well ?Patient left: with call bell/phone within reach;in bed;with family/visitor present ?Nurse Communication: Mobility status;Other (comment) (pt vitals during session) ?PT Visit Diagnosis: Pain;Difficulty in walking, not elsewhere classified (R26.2);Other abnormalities of gait and mobility (R26.89) ?Pain - Right/Left: Right ?Pain - part of body: Knee ?  ? ? ?Time: 1735-6701 ?PT Time Calculation (min) (ACUTE ONLY): 29 min ? ?Charges:  $Therapeutic Exercise: 8-22 mins ?$Therapeutic Activity: 8-22 mins          ?          ? ?Festus Barren., PT, DPT  ?Acute Rehabilitation Services  ?Office [phone] ? ?08/25/2021, 4:01 PM ? ?

## 2021-08-26 LAB — CBC
HCT: 42.2 % (ref 39.0–52.0)
Hemoglobin: 14.3 g/dL (ref 13.0–17.0)
MCH: 30.9 pg (ref 26.0–34.0)
MCHC: 33.9 g/dL (ref 30.0–36.0)
MCV: 91.1 fL (ref 80.0–100.0)
Platelets: 272 10*3/uL (ref 150–400)
RBC: 4.63 MIL/uL (ref 4.22–5.81)
RDW: 13.7 % (ref 11.5–15.5)
WBC: 13.6 10*3/uL — ABNORMAL HIGH (ref 4.0–10.5)
nRBC: 0 % (ref 0.0–0.2)

## 2021-08-26 NOTE — Plan of Care (Signed)
Problem: Health Behavior/Discharge Planning: ?Goal: Ability to manage health-related needs will improve ?Outcome: Progressing ?  ?Problem: Clinical Measurements: ?Goal: Ability to maintain clinical measurements within normal limits will improve ?Outcome: Progressing ?  ?Problem: Coping: ?Goal: Level of anxiety will decrease ?Outcome: Progressing ?  ?Ivan Anchors, RN ?08/26/21 ?7:42 AM ? ?

## 2021-08-26 NOTE — Plan of Care (Signed)
Patient discharging home via private vehicle with wife. ?Ivan Anchors, RN ?08/26/21 ?12:00 PM ? ?

## 2021-08-26 NOTE — Progress Notes (Signed)
Patient ID: Joshua Bowers, male   DOB: Aug 25, 1950, 71 y.o.   MRN: [id number] ? ?Subjective: ?2 Days Post-Op Procedure(s) (LRB): ?TOTAL KNEE ARTHROPLASTY (Right) ?Patient reports pain as mild.   ? ?Patient has complaints of R knee pain. Bad overnight, did not receive meds between 10pm til about 4am. 3 OxyIR seemed to help enough to get his pain under better control this AM. Nausea yesterday is better today.  ? ?Objective: ?Vital signs in last 24 hours: ?Temp:  [98.4 ?F (36.9 ?C)-99.4 ?F (37.4 ?C)] 99.4 ?F (37.4 ?C) (04/07 0456) ?Pulse Rate:  [74-95] 95 (04/07 0456) ?Resp:  [17-18] 18 (04/07 0456) ?BP: (135-175)/(86-102) 175/102 (04/07 0456) ?SpO2:  [91 %-95 %] 92 % (04/07 0456) ? ?Intake/Output from previous day: ? ?Intake/Output Summary (Last 24 hours) at 08/26/2021 0856 ?Last data filed at 08/26/2021 7591 ?Gross per 24 hour  ?Intake 1020.18 ml  ?Output 1350 ml  ?Net -329.82 ml  ?  ?Intake/Output this shift: ?Total I/O ?In: -  ?Out: 350 [Urine:350] ? ?Labs: ?Results for orders placed or performed during the hospital encounter of 08/24/21  ?Surgical PCR Screen  ? Specimen: Nasal Mucosa; Nasal Swab  ?Result Value Ref Range  ? MRSA, PCR NEGATIVE NEGATIVE  ? Staphylococcus aureus NEGATIVE NEGATIVE  ?CBC  ?Result Value Ref Range  ? WBC 21.6 (H) 4.0 - 10.5 K/uL  ? RBC 4.71 4.22 - 5.81 MIL/uL  ? Hemoglobin 14.9 13.0 - 17.0 g/dL  ? HCT 42.5 39.0 - 52.0 %  ? MCV 90.2 80.0 - 100.0 fL  ? MCH 31.6 26.0 - 34.0 pg  ? MCHC 35.1 30.0 - 36.0 g/dL  ? RDW 13.4 11.5 - 15.5 %  ? Platelets 287 150 - 400 K/uL  ? nRBC 0.0 0.0 - 0.2 %  ?Basic metabolic panel  ?Result Value Ref Range  ? Sodium 138 135 - 145 mmol/L  ? Potassium 4.0 3.5 - 5.1 mmol/L  ? Chloride 105 98 - 111 mmol/L  ? CO2 25 22 - 32 mmol/L  ? Glucose, Bld 124 (H) 70 - 99 mg/dL  ? BUN 16 8 - 23 mg/dL  ? Creatinine, Ser 1.04 0.61 - 1.24 mg/dL  ? Calcium 8.5 (L) 8.9 - 10.3 mg/dL  ? GFR, Estimated >60 >60 mL/min  ? Anion gap 8 5 - 15  ?CBC  ?Result Value Ref Range  ? WBC 13.6 (H) 4.0 -  10.5 K/uL  ? RBC 4.63 4.22 - 5.81 MIL/uL  ? Hemoglobin 14.3 13.0 - 17.0 g/dL  ? HCT 42.2 39.0 - 52.0 %  ? MCV 91.1 80.0 - 100.0 fL  ? MCH 30.9 26.0 - 34.0 pg  ? MCHC 33.9 30.0 - 36.0 g/dL  ? RDW 13.7 11.5 - 15.5 %  ? Platelets 272 150 - 400 K/uL  ? nRBC 0.0 0.0 - 0.2 %  ? ? ?Exam - Neurologically intact ?ABD soft ?Neurovascular intact ?Sensation intact distally ?Intact pulses distally ?Dorsiflexion/Plantar flexion intact ?Incision: dressing C/D/I and no drainage ?No cellulitis present ?Compartment soft ?No calf pain or sign of DVT ?Dressing/Incision - clean, dry, no drainage ?Motor function intact - moving foot and toes well on exam.  ? ?Assessment/Plan: ?2 Days Post-Op Procedure(s) (LRB): ?TOTAL KNEE ARTHROPLASTY (Right) ? ?Advance diet ?Up with therapy ?D/C IV fluids ?Past Medical History:  ?Diagnosis Date  ? 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 Bladder outlet obstruction   ? BPH (benign prostatic hyperplasia)   ? Elevated PSA   ? per pt 2013  prostate bx  negative for cancer  ? Exposure to asbestos   ? per pt followed by a law firm in [place],  told last CT 2018 okay  ? GERD (gastroesophageal reflux disease)   ? History of bladder cancer   ? per pt 2011-- s/p  TURBT superficial per pt  ? Hypertension   ? Incomplete right bundle branch block   ? Lower urinary tract symptoms (LUTS)   ? ? ?DVT Prophylaxis - ASA Protocol ?Weight-Bearing as tolerated to Right leg ?Plan D/C today as long as pain remains well controlled (oxy taking 2-3 tabs at a time) ?Continue ice and elevation ?Switch from ACE to TED stocking today ?Will discuss with Dr Tonita Cong ? ?[name] ?08/26/2021, 8:56 AM ? ? ?  ?

## 2021-08-26 NOTE — TOC Transition Note (Signed)
Transition of Care (TOC) - CM/SW Discharge Note ? ? ?Patient Details  ?Name: GIOVONNIE TRETTEL ?MRN: [id number] ?Date of Birth: 03-24-1951 ? ?Transition of Care (TOC) CM/SW Contact:  ?Shylynn Bruning, LCSW ?Phone Number: ?08/26/2021, 12:20 PM ? ? ?Clinical Narrative:    ?Met yesterday with pt and confirming change in plan to HHPT prior to starting OP.  No agency preference - referral place with [facility].  No DME needs. ? ? ?Final next level of care: [place] ?  ? ? ?Patient Goals and CMS Choice ?  ?  ?  ? ?Discharge Placement ?  ?           ?  ?  ?  ?  ? ?Discharge Plan and Services ?  ?  ?           ?DME Arranged: N/A ?  ?  ?  ?  ?HH Arranged: PT ?[place] Agency: [place] ?Date HH Agency Contacted: 08/26/21 ?Time [place]: [phone] ?Representative spoke with at [place]: Tommi Rumps ? ?Social Determinants of Health (SDOH) Interventions ?  ? ? ?Readmission Risk Interventions ?   ? View : No data to display.  ?  ?  ?  ? ? ? ? ? ?

## 2021-08-26 NOTE — Progress Notes (Signed)
Physical Therapy Treatment ?Patient Details ?Name: Joshua Bowers ?MRN: [id number] ?DOB: April 23, 1951 ?Today's Date: 08/26/2021 ? ? ?History of Present Illness Pt is a 71yo male presenting s/p R-TKA on 08/24/21. PMH: OA, GERD, hx of bladder ca, HTN, L-TKA 2022 ? ?  ?PT Comments  ? ? Pt continues to progress toward acute PT goals this session with increased ambulation distance and improved activity tolerance with decreased pain levels compared to yesterday. Pt ambulated ~159f with supervision and use of RW, no LOB observed. Decreased WB/increased R knee flexion during ambulation, improved with increased ambulation distance. Pt performed safe stair negotiation with supervision and MIN A for RW stability and cues for sequencing. Review of proper guarding position for pt's family member when assisting at home. PT reviewed LE HEP, pt demonstrated understanding and improved tolerance today. Pt will benefit from continued skilled PT to increase their independence and maximize safety with mobility.   ?  ?Recommendations for follow up therapy are one component of a multi-disciplinary discharge planning process, led by the attending physician.  Recommendations may be updated based on patient status, additional functional criteria and insurance authorization. ? ?Follow Up Recommendations ? Follow physician's recommendations for discharge plan and follow up therapies ?  ?  ?Assistance Recommended at Discharge Set up Supervision/Assistance  ?Patient can return home with the following A little help with walking and/or transfers;A little help with bathing/dressing/bathroom;Assistance with cooking/housework;Help with stairs or ramp for entrance;Assist for transportation ?  ?Equipment Recommendations ? None recommended by PT (Pt has recommended DME)  ?  ?Recommendations for Other Services   ? ? ?  ?Precautions / Restrictions Precautions ?Precautions: Fall ?Restrictions ?Weight Bearing Restrictions: No  ?  ? ?Mobility ? Bed  Mobility ?Overal bed mobility: Needs Assistance ?Bed Mobility: Supine to Sit, Sit to Supine ?  ?  ?Supine to sit: Supervision ?Sit to supine: Supervision ?  ?General bed mobility comments: supervision for safety. Pt with use of gait belt to assist R LE into/out of bed due to pain ?  ? ?Transfers ?Overall transfer level: Needs assistance ?Equipment used: Rolling walker (2 wheels) ?Transfers: Sit to/from Stand ?Sit to Stand: Supervision ?  ?  ?  ?  ?  ?General transfer comment: Supervision for safety, cues for proper hand placement.  staggered stance with L knee behind to assist with standing from EOB. ?  ? ?Ambulation/Gait ?Ambulation/Gait assistance: Supervision ?Gait Distance (Feet): 100 Feet ?Assistive device: Rolling walker (2 wheels) ?Gait Pattern/deviations: Step-to pattern, Decreased step length - right, Decreased stance time - right, Knee flexed in stance - right, Decreased weight shift to right ?Gait velocity: decreased ?  ?  ?General Gait Details: Pt ambulated with supervision for safety, no physical assist required. Demonstrated reduced step length with R knee flexed, improved R knee extension with further ambulation distance. No overt LOB. ? ? ?Stairs ?  ?Stairs assistance: Supervision ?Stair Management: No rails, Forwards, With walker ?Number of Stairs: 1 ?General stair comments: Cues provided for sequencing and review of proper guarding position for family when assisting at home. ? ? ?Wheelchair Mobility ?  ? ?Modified Rankin (Stroke Patients Only) ?  ? ? ?  ?Balance Overall balance assessment: Needs assistance ?Sitting-balance support: Feet supported, No upper extremity supported ?Sitting balance-Leahy Scale: Fair ?  ?  ?Standing balance support: Reliant on assistive device for balance, During functional activity, Bilateral upper extremity supported ?Standing balance-Leahy Scale: Poor ?  ?  ?  ?  ?  ?  ?  ?  ?  ?  ?  ?  ?  ? ?  ?  Cognition Arousal/Alertness: Awake/alert ?Behavior During Therapy: [facility] for  tasks assessed/performed ?Overall Cognitive Status: Within Functional Limits for tasks assessed ?  ?  ?  ?  ?  ?  ?  ?  ?  ?  ?  ?  ?  ?  ?  ?  ?  ?  ?  ? ?  ?Exercises Total Joint Exercises ?Ankle Circles/Pumps: AROM, 20 reps, Both ?Quad Sets: AROM, Right, 10 reps, Supine ?Short Arc Quad: AAROM, Right, 10 reps, Supine ?Heel Slides: Right, Supine, AAROM, 10 reps ?Hip ABduction/ADduction: AROM, Right, 10 reps, Supine ?Straight Leg Raises: AAROM, Right, 10 reps, Supine ?Long Arc Quad: AAROM, Right, 10 reps, Seated ? ?  ?General Comments   ?  ?  ? ?Pertinent Vitals/Pain Pain Assessment ?Pain Assessment: 0-10 ?Pain Score: 4  ?Pain Location: R knee ?Pain Descriptors / Indicators: Sore ?Pain Intervention(s): Limited activity within patient's tolerance, Monitored during session, Repositioned, Ice applied  ? ? ?Home Living   ?  ?  ?  ?  ?  ?  ?  ?  ?  ?   ?  ?Prior Function    ?  ?  ?   ? ?PT Goals (current goals can now be found in the care plan section) Acute Rehab PT Goals ?Patient Stated Goal: get out of a chair without hurting ?PT Goal Formulation: With patient ?Time For Goal Achievement: 08/31/21 ?Potential to Achieve Goals: Good ?Progress towards PT goals: Progressing toward goals ? ?  ?Frequency ? ? ? 7X/week ? ? ? ?  ?PT Plan Current plan remains appropriate  ? ? ?Co-evaluation   ?  ?  ?  ?  ? ?  ?AM-PAC PT "6 Clicks" Mobility   ?Outcome Measure ? Help needed turning from your back to your side while in a flat bed without using bedrails?: A Little ?Help needed moving from lying on your back to sitting on the side of a flat bed without using bedrails?: A Little ?Help needed moving to and from a bed to a chair (including a wheelchair)?: A Little ?Help needed standing up from a chair using your arms (e.g., wheelchair or bedside chair)?: A Little ?Help needed to walk in hospital room?: A Little ?Help needed climbing 3-5 steps with a railing? : A Little ?6 Click Score: 18 ? ?  ?End of Session Equipment Utilized During  Treatment: Gait belt ?Activity Tolerance: Patient limited by pain;Patient tolerated treatment well ?Patient left: with call bell/phone within reach;in bed;with family/visitor present ?Nurse Communication: Mobility status ?PT Visit Diagnosis: Pain;Difficulty in walking, not elsewhere classified (R26.2);Other abnormalities of gait and mobility (R26.89) ?Pain - Right/Left: Right ?Pain - part of body: Knee ?  ? ? ?Time: 4098-1191 ?PT Time Calculation (min) (ACUTE ONLY): 30 min ? ?Charges:  $Therapeutic Exercise: 8-22 mins ?$Therapeutic Activity: 8-22 mins          ?          ? ?Festus Barren., PT, DPT  ?Acute Rehabilitation Services  ?Office [phone] ? ?08/26/2021, 10:58 AM ? ?

## 2021-08-29 ENCOUNTER — Ambulatory Visit: Payer: Medicare Other

## 2021-08-29 NOTE — Discharge Summary (Signed)
Physician Discharge Summary ?  ?Patient ID: ?Joshua Bowers ?MRN: [id number] ?DOB/AGE: [birth year] 69 y.o. ? ?Admit date: 08/24/2021 ?Discharge date: 08/26/2021 ? ?Primary Diagnosis: Right knee primary osteoarthritis  ?Admission Diagnoses:  ?Past Medical History:  ?Diagnosis Date  ? 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 Bladder outlet obstruction   ? BPH (benign prostatic hyperplasia)   ? Elevated PSA   ? per pt 2013  prostate bx negative for cancer  ? Exposure to asbestos   ? per pt followed by a law firm in [place],  told last CT 2018 okay  ? GERD (gastroesophageal reflux disease)   ? History of bladder cancer   ? per pt 2011-- s/p  TURBT superficial per pt  ? Hypertension   ? Incomplete right bundle branch block   ? Lower urinary tract symptoms (LUTS)   ? ?Discharge Diagnoses:   ?Principal Problem: ?  Right knee DJD ? ?Estimated body mass index is 25.98 kg/m? as calculated from the following: ?  Height as of this encounter: 5' 11.5" (1.816 m). ?  Weight as of this encounter: 85.7 kg. ? ?Procedure:  ?Procedure(s) (LRB): ?TOTAL KNEE ARTHROPLASTY (Right)  ? ?Consults: None ? ?HPI: see H&P ?Laboratory Data: ?Admission on 08/24/2021, Discharged on 08/26/2021  ?Component Date Value Ref Range Status  ? MRSA, PCR 08/24/2021 NEGATIVE  NEGATIVE Final  ? Staphylococcus aureus 08/24/2021 NEGATIVE  NEGATIVE Final  ? Comment: (NOTE) ?The Xpert SA Assay (FDA approved for NASAL specimens in patients 70 ?years of age and older), is one component of a comprehensive ?surveillance program. It is not intended to diagnose infection nor to ?guide or monitor treatment. ?Performed at [facility], [address] ?[place], [place] [address] ?  ? WBC 08/25/2021 21.6 (H)  4.0 - 10.5 K/uL Final  ? RBC 08/25/2021 4.71  4.22 - 5.81 MIL/uL Final  ? Hemoglobin 08/25/2021 14.9  13.0 - 17.0 g/dL Final  ? HCT 08/25/2021 42.5  39.0 - 52.0 % Final  ? MCV 08/25/2021 90.2  80.0 - 100.0 fL Final  ? MCH 08/25/2021 31.6  26.0 - 34.0 pg Final  ? MCHC  08/25/2021 35.1  30.0 - 36.0 g/dL Final  ? RDW 08/25/2021 13.4  11.5 - 15.5 % Final  ? Platelets 08/25/2021 287  150 - 400 K/uL Final  ? nRBC 08/25/2021 0.0  0.0 - 0.2 % Final  ? Performed at [facility], [place] [address], [place], [place] [address]  ? Sodium 08/25/2021 138  135 - 145 mmol/L Final  ? Potassium 08/25/2021 4.0  3.5 - 5.1 mmol/L Final  ? Chloride 08/25/2021 105  98 - 111 mmol/L Final  ? CO2 08/25/2021 25  22 - 32 mmol/L Final  ? Glucose, Bld 08/25/2021 124 (H)  70 - 99 mg/dL Final  ? Glucose reference range applies only to samples taken after fasting for at least 8 hours.  ? BUN 08/25/2021 16  8 - 23 mg/dL Final  ? Creatinine, Ser 08/25/2021 1.04  0.61 - 1.24 mg/dL Final  ? Calcium 08/25/2021 8.5 (L)  8.9 - 10.3 mg/dL Final  ? GFR, Estimated 08/25/2021 >60  >60 mL/min Final  ? Comment: (NOTE) ?Calculated using the CKD-EPI Creatinine Equation (2021) ?  ? Anion gap 08/25/2021 8  5 - 15 Final  ? Performed at [facility], [place] [address], [place], [place] [address]  ? WBC 08/26/2021 13.6 (H)  4.0 - 10.5 K/uL Final  ? RBC 08/26/2021 4.63  4.22 -  5.81 MIL/uL Final  ? Hemoglobin 08/26/2021 14.3  13.0 - 17.0 g/dL Final  ? HCT 08/26/2021 42.2  39.0 - 52.0 % Final  ? MCV 08/26/2021 91.1  80.0 - 100.0 fL Final  ? MCH 08/26/2021 30.9  26.0 - 34.0 pg Final  ? MCHC 08/26/2021 33.9  30.0 - 36.0 g/dL Final  ? RDW 08/26/2021 13.7  11.5 - 15.5 % Final  ? Platelets 08/26/2021 272  150 - 400 K/uL Final  ? nRBC 08/26/2021 0.0  0.0 - 0.2 % Final  ? Performed at [facility], [place] [address] [place], [place] [address]  ?Hospital Outpatient Visit on 08/12/2021  ?Component Date Value Ref Range Status  ? MRSA, PCR 08/12/2021 NEGATIVE  NEGATIVE Final  ? Staphylococcus aureus 08/12/2021 POSITIVE (A)  NEGATIVE Final  ? Comment: (NOTE) ?The Xpert SA Assay (FDA approved for NASAL specimens in patients 43 ?years of age and older), is one component of a  comprehensive ?surveillance program. It is not intended to diagnose infection nor to ?guide or monitor treatment. ?Performed at [facility], [address] ?[place], [place] [address] ?  ? WBC 08/12/2021 7.4  4.0 - 10.5 K/uL Final  ? RBC 08/12/2021 5.84 (H)  4.22 - 5.81 MIL/uL Final  ? Hemoglobin 08/12/2021 17.7 (H)  13.0 - 17.0 g/dL Final  ? HCT 08/12/2021 52.9 (H)  39.0 - 52.0 % Final  ? MCV 08/12/2021 90.6  80.0 - 100.0 fL Final  ? MCH 08/12/2021 30.3  26.0 - 34.0 pg Final  ? MCHC 08/12/2021 33.5  30.0 - 36.0 g/dL Final  ? RDW 08/12/2021 13.8  11.5 - 15.5 % Final  ? Platelets 08/12/2021 339  150 - 400 K/uL Final  ? nRBC 08/12/2021 0.0  0.0 - 0.2 % Final  ? Performed at [facility], [place] [address] [place], [place] [address]  ? Sodium 08/12/2021 138  135 - 145 mmol/L Final  ? Potassium 08/12/2021 3.7  3.5 - 5.1 mmol/L Final  ? Chloride 08/12/2021 102  98 - 111 mmol/L Final  ? CO2 08/12/2021 26  22 - 32 mmol/L Final  ? Glucose, Bld 08/12/2021 85  70 - 99 mg/dL Final  ? Glucose reference range applies only to samples taken after fasting for at least 8 hours.  ? BUN 08/12/2021 22  8 - 23 mg/dL Final  ? Creatinine, Ser 08/12/2021 1.39 (H)  0.61 - 1.24 mg/dL Final  ? Calcium 08/12/2021 9.4  8.9 - 10.3 mg/dL Final  ? GFR, Estimated 08/12/2021 55 (L)  >60 mL/min Final  ? Comment: (NOTE) ?Calculated using the CKD-EPI Creatinine Equation (2021) ?  ? Anion gap 08/12/2021 10  5 - 15 Final  ? Performed at [facility], [place] [address] [place], [place] [address]  ? aPTT 08/12/2021 30  24 - 36 seconds Final  ? Performed at [facility], [place] [address], [place],  [address]  ? Prothrombin Time 08/12/2021 13.3  11.4 - 15.2 seconds Final  ? INR 08/12/2021 1.0  0.8 - 1.2 Final  ? Comment: (NOTE) ?INR goal varies based on device and disease states. ?Performed at [facility], [address] ?[place],  [address] ?  ? Color,  Urine 08/12/2021 YELLOW  YELLOW Final  ? APPearance 08/12/2021 CLEAR  CLEAR Final  ? Specific Gravity, Urine 08/12/2021 1.020  1.005 - 1.030 Final  ? pH 08/12/2021 6.0  5.0 - 8.0 Final  ? Glucose, UA 08/12/2021  NEGATIVE  NEGATIVE mg/dL Final  ? Hgb urine dipstick 08/12/2021 NEGATIVE  NEGATIVE Final  ? Bilirubin Urine 08/12/2021 NEGATIVE  NEGATIVE Final  ? Ketones, ur 08/12/2021 NEGATIVE  NEGATIVE mg/dL Final  ? Protein, ur 08/12/2021 NEGATIVE  NEGATIVE mg/dL Final  ? Nitrite 08/12/2021 NEGATIVE  NEGATIVE Final  ? Leukocytes,Ua 08/12/2021 NEGATIVE  NEGATIVE Final  ? Performed at [facility], [place] [address], [place], [place] [address]  ?  ? ?X-Rays:DG Knee 1-2 Views Right ? ?Result Date: 08/24/2021 ?CLINICAL DATA:  Status post total knee arthroplasty. EXAM: RIGHT KNEE - 1-2 VIEW COMPARISON:  Radiographs 07/14/2020 FINDINGS: Well seated components of a total right knee arthroplasty are noted. No complicating features are identified. IMPRESSION: Well seated components of a total right knee arthroplasty. Electronically Signed   By: Marijo Sanes M.D.   On: 08/24/2021 11:37   ? ?EKG: ?Orders placed or performed during the hospital encounter of 08/12/21  ? EKG 12-Lead  ? EKG 12-Lead  ?  ? ?Hospital Course: CHASKA HAGGER is a 71 y.o. who was admitted to [facility]. They were brought to the operating room on 08/24/2021 and underwent Procedure(s): ?TOTAL KNEE ARTHROPLASTY.  Patient tolerated the procedure well and was later transferred to the recovery room and then to the orthopaedic floor for postoperative care.  They were given PO and IV analgesics for pain control following their surgery.  They were given 24 hours of postoperative antibiotics of  ?Anti-infectives (From admission, onward)  ? ? Start     Dose/Rate Route Frequency Ordered Stop  ? 08/24/21 1400  ceFAZolin (ANCEF) IVPB 2g/100 mL premix       ? 2 g ?200 mL/hr over 30 Minutes Intravenous Every 6 hours 08/24/21 1252 08/24/21 2130  ?  08/24/21 0630  ceFAZolin (ANCEF) IVPB 2g/100 mL premix       ? 2 g ?200 mL/hr over 30 Minutes Intravenous On call to O.R. 08/24/21 4259 08/24/21 0902  ? ?  ? and started on DVT prophylaxis in the form of Aspirin, TED hose, and

## 2021-09-19 ENCOUNTER — Ambulatory Visit: Payer: Medicare Other | Attending: Specialist

## 2021-09-19 DIAGNOSIS — M25561 Pain in right knee: Secondary | ICD-10-CM | POA: Diagnosis present

## 2021-09-19 DIAGNOSIS — G8929 Other chronic pain: Secondary | ICD-10-CM | POA: Insufficient documentation

## 2021-09-19 DIAGNOSIS — M25562 Pain in left knee: Secondary | ICD-10-CM | POA: Diagnosis present

## 2021-09-19 DIAGNOSIS — M25661 Stiffness of right knee, not elsewhere classified: Secondary | ICD-10-CM | POA: Diagnosis present

## 2021-09-19 NOTE — Therapy (Signed)
[place] ?[facility] ?[address] ?[place], [place], [address] ?Phone: [phone]   Fax:  [phone] ? ?Physical Therapy Evaluation ? ?Patient Details  ?Name: Joshua Bowers ?MRN: [id number] ?Date of Birth: 1950-10-15 ?Referring Provider (PT): Phoebe Sharps, [place] ? ? ?Encounter Date: 09/19/2021 ? ? PT End of Session - 09/19/21 1348   ? ? Visit Number 1   ? Number of Visits 12   ? Date for PT Re-Evaluation 10/21/21   ? PT Start Time 8588   ? PT Stop Time 1435   ? PT Time Calculation (min) 46 min   ? Activity Tolerance Patient tolerated treatment well   ? Behavior During Therapy [facility] for tasks assessed/performed   ? ?  ?  ? ?  ? ? ?Past Medical History:  ?Diagnosis Date  ? 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 Bladder outlet obstruction   ? BPH (benign prostatic hyperplasia)   ? Elevated PSA   ? per pt 2013  prostate bx negative for cancer  ? Exposure to asbestos   ? per pt followed by a law firm in [place],  told last CT 2018 okay  ? GERD (gastroesophageal reflux disease)   ? History of bladder cancer   ? per pt 2011-- s/p  TURBT superficial per pt  ? Hypertension   ? Incomplete right bundle branch block   ? Lower urinary tract symptoms (LUTS)   ? ? ?Past Surgical History:  ?Procedure Laterality Date  ? APPENDECTOMY  age 56s  ? INGUINAL HERNIA REPAIR Right 12/27/2015  ? Procedure: HERNIA REPAIR RIGHT INGUINAL ADULT WITH MESH;  Surgeon: [name], MD;  Location: [place];  Service: General;  Laterality: Right;  ? INSERTION OF MESH Right 12/27/2015  ? Procedure: INSERTION OF MESH;  Surgeon: [name], MD;  Location: [place];  Service: General;  Laterality: Right;  ? TONSILLECTOMY  age 35  ? TOTAL KNEE ARTHROPLASTY Left 07/14/2020  ? Procedure: TOTAL KNEE ARTHROPLASTY;  Surgeon: [name], MD;  [facility];  Service: Orthopedics;  Laterality: Left;  2.5 hrs  ? TOTAL KNEE ARTHROPLASTY Right 08/24/2021  ? Procedure: TOTAL KNEE ARTHROPLASTY;  Surgeon:  [name], MD;  Location[facility];  Service: Orthopedics;  Laterality: Right;  ? TRANSURETHRAL RESECTION OF PROSTATE N/A 11/20/2017  ? Procedure: TRANSURETHRAL RESECTION OF THE PROSTATE (TURP);  Surgeon: [name], MD;  Location[facility];  Service: Urology;  Laterality: N/A;  ? ULNAR TUNNEL RELEASE Bilateral 2012;  2009  ? WRIST SURGERY Left 2013  ? no hardware  ? ? ?There were no vitals filed for this visit. ? ? ? Subjective Assessment - 09/19/21 1349   ? ? Subjective Patient reports that he had a right TKA on 08/24/21. He feels like it is doing better than his left knee. He was having home health PT twice a week for the past two weeks. He has been using a cane mostly to get around, but he did not use it today. He has begun to have pain in his right hip and it is radiating down the back of his right leg. He is still having trouble sleeping at night due to the pain and discomfort.   ? Pertinent History left TKA (2022)   ? Limitations Standing;Walking   ? How long can you walk comfortably? 5-10 minutes   ? Patient Stated Goals go shopping, hiking   ? Currently in Pain? Yes   ? Pain Score 8    ?  Pain Location Knee   ? Pain Orientation Right   ? Pain Descriptors / Indicators Aching;Throbbing   ? Pain Type Surgical pain   ? Pain Radiating Towards intermittent bursts of pain in RLE   ? Pain Onset 1 to 4 weeks ago   ? Pain Frequency Constant   ? Aggravating Factors  standing, walking   ? Pain Relieving Factors medication, ice   ? Effect of Pain on Daily Activities not able to complete this daily activities   ? ?  ?  ? ?  ? ? ? ? ? [facility] PT Assessment - 09/19/21 0001   ? ?  ? Assessment  ? Medical Diagnosis Right TKA   ? Referring Provider (PT) Phoebe Sharps, PA-C   ? Onset Date/Surgical Date 08/24/21   ? Next MD Visit 10/07/21   ? Prior Therapy Yes, home health   ?  ? Precautions  ? Precautions None   ?  ? Restrictions  ? Weight Bearing Restrictions No   ?  ? Balance Screen  ? Has the patient fallen in the  past 6 months No   ? Has the patient had a decrease in activity level because of a fear of falling?  No   ? Is the patient reluctant to leave their home because of a fear of falling?  No   ?  ? Home Environment  ? Living Environment Private residence   ? Home Access Stairs to enter   ? Entrance Stairs-Number of Steps 1   ? Home Layout One level   ?  ? Prior Function  ? Level of Independence Independent   ? Vocation Full time employment   ? Vocation Requirements alot of transfers and walking   ? Leisure golf, hiking   ?  ? Cognition  ? Overall Cognitive Status Within Functional Limits for tasks assessed   ? Memory Appears intact   ? Awareness Appears intact   ? Problem Solving Appears intact   ?  ? Observation/Other Assessments  ? Observations incision appears to be well healing   ? Focus on Therapeutic Outcomes (FOTO)  20.68   ?  ? Sensation  ? Additional Comments Patient reports no numbnes or tingling   ?  ? ROM / Strength  ? AROM / PROM / Strength AROM;PROM   ?  ? AROM  ? AROM Assessment Site Knee   ? Right/Left Knee Right;Left   ? Right Knee Extension 12   from neutral; painful  ? Right Knee Flexion 95   painful  ? Left Knee Extension --   WFL; within 5 degrees of neutral  ? Left Knee Flexion 131   ?  ? PROM  ? PROM Assessment Site Knee   ? Right/Left Knee Right   ? Right Knee Flexion 110   limited by pain  ?  ? Palpation  ? Palpation comment TTP: right quadriceps, hip adductors, TFL, and IT band   ?  ? Transfers  ? Transfers Sit to Stand;Stand to Sit   ? Sit to Stand 6: Modified independent (Device/Increase time);With upper extremity assist;With armrests   ? Stand to Sit 6: Modified independent (Device/Increase time);With upper extremity assist;With armrests   ?  ? Ambulation/Gait  ? Ambulation/Gait Yes   ? Ambulation/Gait Assistance 6: Modified independent (Device/Increase time)   ? Assistive device None   ? Gait Pattern Step-through pattern;Decreased hip/knee flexion - right;Decreased hip/knee flexion -  left;Decreased dorsiflexion - right;Right flexed knee in stance   ? Ambulation Surface  Level;Indoor   ? Gait velocity decreased   ? ?  ?  ? ?  ? ? ? ? ? ? ? ? ? ? ? ? ? ?Objective measurements completed on examination: See above findings.  ? ? ? ? ? [place] Adult PT Treatment/Exercise - 09/19/21 0001   ? ?  ? Modalities  ? Modalities Vasopneumatic   ?  ? Vasopneumatic  ? Number Minutes Vasopneumatic  15 minutes   ? Vasopnuematic Location  Knee   ? Vasopneumatic Pressure Low   ? Vasopneumatic Temperature  34   ? ?  ?  ? ?  ? ? ? ? ? ? ? ? ? ? ? ? ? ? ? PT Long Term Goals - 09/19/21 1450   ? ?  ? PT LONG TERM GOAL #1  ? Title Patient will be independent with his HEP.   ? Time 4   ? Period Weeks   ? Status New   ? Target Date 10/17/21   ?  ? PT LONG TERM GOAL #2  ? Title Patient will be able to demonstrate at least 120 degrees of active right knee flexion for improved function navigating steps.   ? Time 4   ? Period Weeks   ? Status New   ? Target Date 10/17/21   ?  ? PT LONG TERM GOAL #3  ? Title Patient will be able to demonstrate active knee extension on the right knee within 5 degrees of neutral for improved gait mechanics.   ? Time 4   ? Period Weeks   ? Status New   ? Target Date 10/17/21   ?  ? PT LONG TERM GOAL #4  ? Title Patient will be able to walk at least 20 minutes without being limited by his right knee pain.   ? Time 4   ? Period Weeks   ? Status New   ? Target Date 10/17/21   ? ?  ?  ? ?  ? ? ? ? ? ? ? ? ? Plan - 09/19/21 1442   ? ? Clinical Impression Statement Patient is a 71 year old male presenting to physical therapy following a right TKA on 08/24/21. He presented to treatment with moderate pain severity and irritability. He exhibted reduced right knee active and PROM with pain being his primary limiting factor. Recommend that he continue with skilled physical therapy to addresss his remaining impairments to return to his prior level of function.   ? Personal Factors and Comorbidities Other   ?  Examination-Activity Limitations Locomotion Level;Transfers;Stand;Lift;Stairs;Dressing;Squat;Sleep   ? Examination-Participation Restrictions Occupation;Community Activity;Driving;Shop;Yard Work   ? Stability/Clinical

## 2021-09-21 ENCOUNTER — Ambulatory Visit: Payer: Medicare Other

## 2021-09-21 DIAGNOSIS — M25561 Pain in right knee: Secondary | ICD-10-CM

## 2021-09-21 DIAGNOSIS — M25661 Stiffness of right knee, not elsewhere classified: Secondary | ICD-10-CM

## 2021-09-21 NOTE — Therapy (Signed)
[place] ?[facility] ?[place] ?[place], [place], [address] ?Phone: [phone]   Fax:  [phone] ? ?Physical Therapy Treatment ? ?Patient Details  ?Name: Joshua Bowers ?MRN: [id number] ?Date of Birth: 03-26-1951 ?Referring Provider (PT): Phoebe Sharps, [place] ? ? ?Encounter Date: 09/21/2021 ? ? PT End of Session - 09/21/21 1349   ? ? Visit Number 2   ? Number of Visits 12   ? Date for PT Re-Evaluation 10/21/21   ? PT Start Time 1345   ? PT Stop Time 1435   ? PT Time Calculation (min) 50 min   ? Activity Tolerance Patient tolerated treatment well   ? Behavior During Therapy [facility] for tasks assessed/performed   ? ?  ?  ? ?  ? ? ?Past Medical History:  ?Diagnosis Date  ? 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 Bladder outlet obstruction   ? BPH (benign prostatic hyperplasia)   ? Elevated PSA   ? per pt 2013  prostate bx negative for cancer  ? Exposure to asbestos   ? per pt followed by a law firm in [place],  told last CT 2018 okay  ? GERD (gastroesophageal reflux disease)   ? History of bladder cancer   ? per pt 2011-- s/p  TURBT superficial per pt  ? Hypertension   ? Incomplete right bundle branch block   ? Lower urinary tract symptoms (LUTS)   ? ? ?Past Surgical History:  ?Procedure Laterality Date  ? APPENDECTOMY  age 89s  ? INGUINAL HERNIA REPAIR Right 12/27/2015  ? Procedure: HERNIA REPAIR RIGHT INGUINAL ADULT WITH MESH;  Surgeon: [name], MD;  Location: [place];  Service: General;  Laterality: Right;  ? INSERTION OF MESH Right 12/27/2015  ? Procedure: INSERTION OF MESH;  Surgeon: [name], MD;  Location: [place];  Service: General;  Laterality: Right;  ? TONSILLECTOMY  age 71  ? TOTAL KNEE ARTHROPLASTY Left 07/14/2020  ? Procedure: TOTAL KNEE ARTHROPLASTY;  Surgeon: [name], MD;  [facility];  Service: Orthopedics;  Laterality: Left;  2.5 hrs  ? TOTAL KNEE ARTHROPLASTY Right 08/24/2021  ? Procedure: TOTAL KNEE ARTHROPLASTY;  Surgeon:  [name], MD;  [facility];  Service: Orthopedics;  Laterality: Right;  ? TRANSURETHRAL RESECTION OF PROSTATE N/A 11/20/2017  ? Procedure: TRANSURETHRAL RESECTION OF THE PROSTATE (TURP);  Surgeon: [name], MD;  Location: [facility];  Service: Urology;  Laterality: N/A;  ? ULNAR TUNNEL RELEASE Bilateral 2012;  2009  ? WRIST SURGERY Left 2013  ? no hardware  ? ? ?There were no vitals filed for this visit. ? ? Subjective Assessment - 09/21/21 1349   ? ? Subjective Pt arrives for today's treatment session reporting 4-5/10 right knee pain.   ? Pertinent History left TKA (2022)   ? Limitations Standing;Walking   ? How long can you walk comfortably? 5-10 minutes   ? Patient Stated Goals go shopping, hiking   ? Currently in Pain? Yes   ? Pain Score 5    ? Pain Location Knee   ? Pain Orientation Right   ? Pain Onset 1 to 4 weeks ago   ? ?  ?  ? ?  ? ? ? ? ? ? ? ? ? ? ? ? ? ? ? ? ? ? ? ? [place] Adult PT Treatment/Exercise - 09/21/21 0001   ? ?  ? Exercises  ? Exercises Knee/Hip   ?  ? Knee/Hip Exercises:  Standing  ? Heel Raises Both;20 reps   ? Heel Raises Limitations Toe Raises x 20 reps   ? Forward Lunges Right;20 reps   ? Forward Lunges Limitations 6 inch step   ? Forward Step Up Both;20 reps;Hand Hold: 2;Step Height: 6"   ? Rocker Board 3 minutes   ?  ? Modalities  ? Modalities Vasopneumatic;Electrical Stimulation   ?  ? Electrical Stimulation  ? Electrical Stimulation Location Right knee   ? Electrical Stimulation Action IFC   ? Electrical Stimulation Parameters 80-150 Hz   ? Electrical Stimulation Goals Pain;Edema   ?  ? Vasopneumatic  ? Number Minutes Vasopneumatic  15 minutes   ? Vasopnuematic Location  Knee   ? Vasopneumatic Pressure Low   ? Vasopneumatic Temperature  34   ? ?  ?  ? ?  ? ? ? ? ? ? ? ? ? ? ? ? ? ? ? PT Long Term Goals - 09/19/21 1450   ? ?  ? PT LONG TERM GOAL #1  ? Title Patient will be independent with his HEP.   ? Time 4   ? Period Weeks   ? Status New   ? Target Date  10/17/21   ?  ? PT LONG TERM GOAL #2  ? Title Patient will be able to demonstrate at least 120 degrees of active right knee flexion for improved function navigating steps.   ? Time 4   ? Period Weeks   ? Status New   ? Target Date 10/17/21   ?  ? PT LONG TERM GOAL #3  ? Title Patient will be able to demonstrate active knee extension on the right knee within 5 degrees of neutral for improved gait mechanics.   ? Time 4   ? Period Weeks   ? Status New   ? Target Date 10/17/21   ?  ? PT LONG TERM GOAL #4  ? Title Patient will be able to walk at least 20 minutes without being limited by his right knee pain.   ? Time 4   ? Period Weeks   ? Status New   ? Target Date 10/17/21   ? ?  ?  ? ?  ? ? ? ? ? ? ? ? Plan - 09/21/21 1350   ? ? Clinical Impression Statement Pt arrives for today's treatment session reporting 4-5/10 right knee pain.  Pt states that he is also experiencing some sciatica that it causing him to have issues with sleeping.  Pt instructed in standing and seated LE exercises to increase strenght, function, and safety.  Pt requiring min cues for proper technique and pacing.  Pt also requiring cues to avoid leaning with seated exercises.  Normal responses to estim and vaso upon removal.  Pt reported 2/10 right knee pain at completion of today's treatment session.   ? Personal Factors and Comorbidities Other   ? Examination-Activity Limitations Locomotion Level;Transfers;Stand;Lift;Stairs;Dressing;Squat;Sleep   ? Examination-Participation Restrictions Occupation;Community Activity;Driving;Shop;Yard Work   ? Stability/Clinical Decision Making Stable/Uncomplicated   ? Rehab Potential Good   ? PT Frequency 3x / week   ? PT Duration 4 weeks   ? PT Treatment/Interventions ADLs/Self Care Home Management;Cryotherapy;Electrical Stimulation;Moist Heat;Neuromuscular re-education;Therapeutic exercise;Therapeutic activities;Functional mobility training;Stair training;Gait training;Patient/family education;Manual  techniques;Passive range of motion;Taping;Vasopneumatic Device   ? PT Next Visit Plan nustep, PROM, and interventions for improved knee mobility, modalities as needed   ? Consulted and Agree with Plan of Care Patient   ? ?  ?  ? ?  ? ? ?  Patient will benefit from skilled therapeutic intervention in order to improve the following deficits and impairments:  Abnormal gait, Decreased range of motion, Difficulty walking, Pain, Decreased activity tolerance, Hypomobility, Impaired flexibility, Decreased strength, Decreased mobility ? ?Visit Diagnosis: ?Acute pain of right knee ? ?Stiffness of right knee, not elsewhere classified ? ? ? ? ?Problem List ?Patient Active Problem List  ? Diagnosis Date Noted  ? 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 S/P total knee replacement 07/15/2020  ? 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 BPH with obstruction/lower urinary tract symptoms 11/20/2017  ? S/P hernia repair 12/27/2015  ? ? ?Kathrynn Ducking, PTA ?09/21/2021, 2:38 PM ? ?[place] ?[facility] ?[place] ?[place], [place], [address] ?Phone: [phone]   Fax:  [phone] ? ?Name: Joshua Bowers ?MRN: [id number] ?Date of Birth: 1950/08/10 ? ? ? ?

## 2021-09-22 ENCOUNTER — Ambulatory Visit: Payer: Medicare Other | Admitting: *Deleted

## 2021-09-22 DIAGNOSIS — M25661 Stiffness of right knee, not elsewhere classified: Secondary | ICD-10-CM

## 2021-09-22 DIAGNOSIS — M25561 Pain in right knee: Secondary | ICD-10-CM

## 2021-09-22 NOTE — Therapy (Signed)
[place] ?[facility] ?[place] ?[place], [place], [address] ?Phone: [phone]   Fax:  [phone] ? ?Physical Therapy Treatment ? ?Patient Details  ?Name: Joshua Bowers ?MRN: [id number] ?Date of Birth: 1950/12/15 ?Referring Provider (PT): Phoebe Sharps, [place] ? ? ?Encounter Date: 09/22/2021 ? ? PT End of Session - 09/22/21 1306   ? ? Visit Number 3   ? Number of Visits 12   ? Date for PT Re-Evaluation 10/21/21   ? PT Start Time 1300   ? PT Stop Time 1358   ? PT Time Calculation (min) 58 min   ? ?  ?  ? ?  ? ? ?Past Medical History:  ?Diagnosis Date  ? 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 Bladder outlet obstruction   ? BPH (benign prostatic hyperplasia)   ? Elevated PSA   ? per pt 2013  prostate bx negative for cancer  ? Exposure to asbestos   ? per pt followed by a law firm in [place],  told last CT 2018 okay  ? GERD (gastroesophageal reflux disease)   ? History of bladder cancer   ? per pt 2011-- s/p  TURBT superficial per pt  ? Hypertension   ? Incomplete right bundle branch block   ? Lower urinary tract symptoms (LUTS)   ? ? ?Past Surgical History:  ?Procedure Laterality Date  ? APPENDECTOMY  age 60s  ? INGUINAL HERNIA REPAIR Right 12/27/2015  ? Procedure: HERNIA REPAIR RIGHT INGUINAL ADULT WITH MESH;  Surgeon: [name], MD;  Location: [facility];  Service: General;  Laterality: Right;  ? INSERTION OF MESH Right 12/27/2015  ? Procedure: INSERTION OF MESH;  Surgeon: [name], MD;  Location: [place];  Service: General;  Laterality: Right;  ? TONSILLECTOMY  age 10  ? TOTAL KNEE ARTHROPLASTY Left 07/14/2020  ? Procedure: TOTAL KNEE ARTHROPLASTY;  Surgeon: [name], MD;  Location: [facility];  Service: Orthopedics;  Laterality: Left;  2.5 hrs  ? TOTAL KNEE ARTHROPLASTY Right 08/24/2021  ? Procedure: TOTAL KNEE ARTHROPLASTY;  Surgeon: [name], MD;  Location: [facility];  Service: Orthopedics;  Laterality: Right;  ? TRANSURETHRAL RESECTION OF PROSTATE  N/A 11/20/2017  ? Procedure: TRANSURETHRAL RESECTION OF THE PROSTATE (TURP);  Surgeon: [name], MD;  Location: [facility];  Service: Urology;  Laterality: N/A;  ? ULNAR TUNNEL RELEASE Bilateral 2012;  2009  ? WRIST SURGERY Left 2013  ? no hardware  ? ? ?There were no vitals filed for this visit. ? ? Subjective Assessment - 09/22/21 1305   ? ? Subjective Pt arrives for today's treatment session reporting 4-5/10 right knee pain, but his sciatica is also flared up today   ? Pertinent History left TKA (2022)   ? Limitations Standing;Walking   ? How long can you walk comfortably? 5-10 minutes   ? Patient Stated Goals go shopping, hiking   ? Currently in Pain? Yes   ? Pain Score 5    ? Pain Location Knee   ? ?  ?  ? ?  ? ? ? ? ? ? ? ? ? ? ? ? ? ? ? ? ? ? ? ? [facility] Adult PT Treatment/Exercise - 09/22/21 0001   ? ?  ? Exercises  ? Exercises Knee/Hip   ?  ? Knee/Hip Exercises: Standing  ? Heel Raises Both;20 reps   ? Heel Raises Limitations Toe Raises x 20 reps   ? Forward Step Up Both;20 reps;Hand Hold:  2;Step Height: 6"   ? Rocker Board 3 minutes   ?  ? Modalities  ? Modalities Vasopneumatic;Electrical Stimulation   ?  ? Electrical Stimulation  ? Electrical Stimulation Location Right knee   ? Electrical Stimulation Action IFC   ? Electrical Stimulation Parameters 80-'150hz'$  x 15 mins   ? Electrical Stimulation Goals Pain;Edema   ?  ? Vasopneumatic  ? Number Minutes Vasopneumatic  15 minutes   ? Vasopnuematic Location  Knee   ? Vasopneumatic Pressure Low   ? Vasopneumatic Temperature  34   ?  ? Manual Therapy  ? Manual Therapy Soft tissue mobilization;Passive ROM   ? Soft tissue mobilization STW to posterior capsule area and scar mobs   ? Passive ROM PROM for flexion. To 102 degrees today   ? ?  ?  ? ?  ? ? ? ? ? ? ? ? ? ? ? ? ? ? ? PT Long Term Goals - 09/19/21 1450   ? ?  ? PT LONG TERM GOAL #1  ? Title Patient will be independent with his HEP.   ? Time 4   ? Period Weeks   ? Status New   ? Target Date  10/17/21   ?  ? PT LONG TERM GOAL #2  ? Title Patient will be able to demonstrate at least 120 degrees of active right knee flexion for improved function navigating steps.   ? Time 4   ? Period Weeks   ? Status New   ? Target Date 10/17/21   ?  ? PT LONG TERM GOAL #3  ? Title Patient will be able to demonstrate active knee extension on the right knee within 5 degrees of neutral for improved gait mechanics.   ? Time 4   ? Period Weeks   ? Status New   ? Target Date 10/17/21   ?  ? PT LONG TERM GOAL #4  ? Title Patient will be able to walk at least 20 minutes without being limited by his right knee pain.   ? Time 4   ? Period Weeks   ? Status New   ? Target Date 10/17/21   ? ?  ?  ? ?  ? ? ? ? ? ? ? ? Plan - 09/22/21 1307   ? ? Clinical Impression Statement Pt arrived today doing fair wirh RT knee, but having RT side sciatica as well  today. Rx focused on ROM as well as strengthening for RT LE. He was able to reach 102 degrees of knee flexion today.   ? Personal Factors and Comorbidities Other   ? Examination-Activity Limitations Locomotion Level;Transfers;Stand;Lift;Stairs;Dressing;Squat;Sleep   ? Examination-Participation Restrictions Occupation;Community Activity;Driving;Shop;Yard Work   ? Stability/Clinical Decision Making Stable/Uncomplicated   ? Rehab Potential Good   ? PT Frequency 3x / week   ? PT Duration 4 weeks   ? PT Treatment/Interventions ADLs/Self Care Home Management;Cryotherapy;Electrical Stimulation;Moist Heat;Neuromuscular re-education;Therapeutic exercise;Therapeutic activities;Functional mobility training;Stair training;Gait training;Patient/family education;Manual techniques;Passive range of motion;Taping;Vasopneumatic Device   ? PT Next Visit Plan nustep, PROM, and interventions for improved knee mobility, modalities as needed   ? Consulted and Agree with Plan of Care Patient   ? ?  ?  ? ?  ? ? ?Patient will benefit from skilled therapeutic intervention in order to improve the following deficits  and impairments:  Abnormal gait, Decreased range of motion, Difficulty walking, Pain, Decreased activity tolerance, Hypomobility, Impaired flexibility, Decreased strength, Decreased mobility ? ?Visit Diagnosis: ?Acute pain of right knee ? ?  Stiffness of right knee, not elsewhere classified ? ? ? ? ?Problem List ?Patient Active Problem List  ? Diagnosis Date Noted  ? 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 S/P total knee replacement 07/15/2020  ? 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 BPH with obstruction/lower urinary tract symptoms 11/20/2017  ? S/P hernia repair 12/27/2015  ? ? ?Buna Cuppett,CHRIS, PTA ?09/22/2021, 2:14 PM ? ?[place] ?[facility] ?[place] ?[place], [place], [address] ?Phone: [phone]   Fax:  [phone] ? ?Name: Joshua Bowers ?MRN: [id number] ?Date of Birth: Jun 09, 1950 ? ? ? ?

## 2021-09-26 ENCOUNTER — Ambulatory Visit: Payer: Medicare Other | Admitting: Physical Therapy

## 2021-09-26 DIAGNOSIS — M25561 Pain in right knee: Secondary | ICD-10-CM | POA: Diagnosis not present

## 2021-09-26 DIAGNOSIS — M25661 Stiffness of right knee, not elsewhere classified: Secondary | ICD-10-CM

## 2021-09-26 NOTE — Therapy (Signed)
[place] ?[facility] ?[place] ?[place], [place], [address] ?Phone: [phone]   Fax:  [phone] ? ?Physical Therapy Treatment ? ?Patient Details  ?Name: Joshua Bowers ?MRN: [id number] ?Date of Birth: 05/03/1951 ?Referring Provider (PT): Phoebe Sharps, [place] ? ? ?Encounter Date: 09/26/2021 ? ? PT End of Session - 09/26/21 1518   ? ? Visit Number 4   ? Number of Visits 12   ? Date for PT Re-Evaluation 10/21/21   ? PT Start Time 0145   ? PT Stop Time 0243   ? PT Time Calculation (min) 58 min   ? Activity Tolerance Patient tolerated treatment well   ? Behavior During Therapy [facility] for tasks assessed/performed   ? ?  ?  ? ?  ? ? ?Past Medical History:  ?Diagnosis Date  ? 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 Bladder outlet obstruction   ? BPH (benign prostatic hyperplasia)   ? Elevated PSA   ? per pt 2013  prostate bx negative for cancer  ? Exposure to asbestos   ? per pt followed by a law firm in [place],  told last CT 2018 okay  ? GERD (gastroesophageal reflux disease)   ? History of bladder cancer   ? per pt 2011-- s/p  TURBT superficial per pt  ? Hypertension   ? Incomplete right bundle branch block   ? Lower urinary tract symptoms (LUTS)   ? ? ?Past Surgical History:  ?Procedure Laterality Date  ? APPENDECTOMY  age 52s  ? INGUINAL HERNIA REPAIR Right 12/27/2015  ? Procedure: HERNIA REPAIR RIGHT INGUINAL ADULT WITH MESH;  Surgeon: [name], MD;  Location: [facility];  Service: General;  Laterality: Right;  ? INSERTION OF MESH Right 12/27/2015  ? Procedure: INSERTION OF MESH;  Surgeon: [name], MD;  Location: [facility];  Service: General;  Laterality: Right;  ? TONSILLECTOMY  age 38  ? TOTAL KNEE ARTHROPLASTY Left 07/14/2020  ? Procedure: TOTAL KNEE ARTHROPLASTY;  Surgeon: [name], MD;  [facility];  Service: Orthopedics;  Laterality: Left;  2.5 hrs  ? TOTAL KNEE ARTHROPLASTY Right 08/24/2021  ? Procedure: TOTAL KNEE ARTHROPLASTY;  Surgeon:  [name], MD;  [facility];  Service: Orthopedics;  Laterality: Right;  ? TRANSURETHRAL RESECTION OF PROSTATE N/A 11/20/2017  ? Procedure: TRANSURETHRAL RESECTION OF THE PROSTATE (TURP);  Surgeon: [name], MD;  Location: [facility];  Service: Urology;  Laterality: N/A;  ? ULNAR TUNNEL RELEASE Bilateral 2012;  2009  ? WRIST SURGERY Left 2013  ? no hardware  ? ? ?There were no vitals filed for this visit. ? ? Subjective Assessment - 09/26/21 1519   ? ? Subjective Slept 8 hours last night.  First time since surgery.   ? Pertinent History left TKA (2022)   ? Limitations Standing;Walking   ? How long can you walk comfortably? 5-10 minutes   ? Patient Stated Goals go shopping, hiking   ? Currently in Pain? Yes   ? Pain Score 4    ? Pain Location Knee   ? Pain Orientation Right   ? Pain Descriptors / Indicators Aching;Throbbing   ? Pain Type Surgical pain   ? Pain Onset 1 to 4 weeks ago   ? ?  ?  ? ?  ? ? ? ? ? ? ? ? ? ? ? ? ? ? ? ? ? ? ? ? [facility] Adult PT Treatment/Exercise - 09/26/21 0001   ? ?  ?  Exercises  ? Exercises Knee/Hip   ?  ? Knee/Hip Exercises: Aerobic  ? Recumbent Bike 4 minutes at seat 8 on level 3.   ? Nustep Level 5 moving seat forward x 2 to increase flexion x 11 minutes.   ?  ? Knee/Hip Exercises: Supine  ? Quad Sets Limitations 16 minutes faciltated with Bi-Phasic electrical stimulation x 16 minutes with 10 sec extension holds f/b a 10 sec rest.   ?  ? Modalities  ? Modalities Electrical Stimulation;Vasopneumatic   ?  ? Electrical Stimulation  ? Electrical Stimulation Location Right knee.   ? Electrical Stimulation Action IFC   ? Electrical Stimulation Parameters 80-150 Hz. x 20 minutes.   ? Electrical Stimulation Goals Pain;Edema   ?  ? Vasopneumatic  ? Number Minutes Vasopneumatic  20 minutes   ? Vasopnuematic Location  --   Right knee.  ? Vasopneumatic Pressure Low   ? ?  ?  ? ?  ? ? ? ? ? ? ? ? ? ? ? ? ? ? ? PT Long Term Goals - 09/19/21 1450   ? ?  ? PT LONG TERM GOAL #1   ? Title Patient will be independent with his HEP.   ? Time 4   ? Period Weeks   ? Status New   ? Target Date 10/17/21   ?  ? PT LONG TERM GOAL #2  ? Title Patient will be able to demonstrate at least 120 degrees of active right knee flexion for improved function navigating steps.   ? Time 4   ? Period Weeks   ? Status New   ? Target Date 10/17/21   ?  ? PT LONG TERM GOAL #3  ? Title Patient will be able to demonstrate active knee extension on the right knee within 5 degrees of neutral for improved gait mechanics.   ? Time 4   ? Period Weeks   ? Status New   ? Target Date 10/17/21   ?  ? PT LONG TERM GOAL #4  ? Title Patient will be able to walk at least 20 minutes without being limited by his right knee pain.   ? Time 4   ? Period Weeks   ? Status New   ? Target Date 10/17/21   ? ?  ?  ? ?  ? ? ? ? ? ? ? ? Plan - 09/26/21 1523   ? ? Clinical Impression Statement The patient did an excellent job today, progressing to recumbent bike at seat 8 today.  Very good quad activation facilitaed with Bi-phasic electrical stimulation today.  Normal modality response following removal of modality.   ? Personal Factors and Comorbidities Other   ? Examination-Activity Limitations Locomotion Level;Transfers;Stand;Lift;Stairs;Dressing;Squat;Sleep   ? Examination-Participation Restrictions Occupation;Community Activity;Driving;Shop;Yard Work   ? Stability/Clinical Decision Making Stable/Uncomplicated   ? Rehab Potential Good   ? PT Frequency 3x / week   ? PT Duration 4 weeks   ? PT Treatment/Interventions ADLs/Self Care Home Management;Cryotherapy;Electrical Stimulation;Moist Heat;Neuromuscular re-education;Therapeutic exercise;Therapeutic activities;Functional mobility training;Stair training;Gait training;Patient/family education;Manual techniques;Passive range of motion;Taping;Vasopneumatic Device   ? PT Next Visit Plan Recumbent bike.   ? Consulted and Agree with Plan of Care Patient   ? ?  ?  ? ?  ? ? ?Patient will benefit from  skilled therapeutic intervention in order to improve the following deficits and impairments:  Abnormal gait, Decreased range of motion, Difficulty walking, Pain, Decreased activity tolerance, Hypomobility, Impaired flexibility, Decreased strength, Decreased mobility ? ?  Visit Diagnosis: ?Acute pain of right knee ? ?Stiffness of right knee, not elsewhere classified ? ? ? ? ?Problem List ?Patient Active Problem List  ? Diagnosis Date Noted  ? 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 S/P total knee replacement 07/15/2020  ? 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 BPH with obstruction/lower urinary tract symptoms 11/20/2017  ? S/P hernia repair 12/27/2015  ? ? ?Lunell Robart, [place], PT ?09/26/2021, 3:27 PM ? ?[place] ?[facility] ?[place] ?[place], [place], [address] ?Phone: [phone]   Fax:  [phone] ? ?Name: Joshua Bowers ?MRN: [id number] ?Date of Birth: [birth year] ? ? ? ?

## 2021-09-28 ENCOUNTER — Encounter: Payer: Self-pay | Admitting: Physical Therapy

## 2021-09-28 ENCOUNTER — Ambulatory Visit: Payer: Medicare Other | Admitting: Physical Therapy

## 2021-09-28 DIAGNOSIS — G8929 Other chronic pain: Secondary | ICD-10-CM

## 2021-09-28 DIAGNOSIS — M25561 Pain in right knee: Secondary | ICD-10-CM

## 2021-09-28 DIAGNOSIS — M25661 Stiffness of right knee, not elsewhere classified: Secondary | ICD-10-CM

## 2021-09-28 NOTE — Therapy (Signed)
[place] ?[facility] ?[place] ?[place], [place], [address] ?Phone: [phone]   Fax:  [phone] ? ?Physical Therapy Treatment ? ?Patient Details  ?Name: Joshua Bowers ?MRN: [id number] ?Date of Birth: [birth year] ?Referring Provider (PT): Phoebe Sharps, [place] ? ? ?Encounter Date: 09/28/2021 ? ? PT End of Session - 09/28/21 1357   ? ? Visit Number 5   ? Number of Visits 12   ? Date for PT Re-Evaluation 10/21/21   ? PT Start Time 0150   ? PT Stop Time 0235   ? PT Time Calculation (min) 45 min   ? Activity Tolerance Patient tolerated treatment well   ? Behavior During Therapy [facility] for tasks assessed/performed   ? ?  ?  ? ?  ? ? ?Past Medical History:  ?Diagnosis Date  ? 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 Bladder outlet obstruction   ? BPH (benign prostatic hyperplasia)   ? Elevated PSA   ? per pt 2013  prostate bx negative for cancer  ? Exposure to asbestos   ? per pt followed by a law firm in [place],  told last CT 2018 okay  ? GERD (gastroesophageal reflux disease)   ? History of bladder cancer   ? per pt 2011-- s/p  TURBT superficial per pt  ? Hypertension   ? Incomplete right bundle branch block   ? Lower urinary tract symptoms (LUTS)   ? ? ?Past Surgical History:  ?Procedure Laterality Date  ? APPENDECTOMY  age 54s  ? INGUINAL HERNIA REPAIR Right 12/27/2015  ? Procedure: HERNIA REPAIR RIGHT INGUINAL ADULT WITH MESH;  Surgeon: [name], MD;  Location: [facility];  Service: General;  Laterality: Right;  ? INSERTION OF MESH Right 12/27/2015  ? Procedure: INSERTION OF MESH;  Surgeon: [name], MD;  Location: [facility];  Service: General;  Laterality: Right;  ? TONSILLECTOMY  age 83  ? TOTAL KNEE ARTHROPLASTY Left 07/14/2020  ? Procedure: TOTAL KNEE ARTHROPLASTY;  Surgeon: [name], MD;  [facility];  Service: Orthopedics;  Laterality: Left;  2.5 hrs  ? TOTAL KNEE ARTHROPLASTY Right 08/24/2021  ? Procedure: TOTAL KNEE ARTHROPLASTY;  Surgeon:  [name], MD;  [facility];  Service: Orthopedics;  Laterality: Right;  ? TRANSURETHRAL RESECTION OF PROSTATE N/A 11/20/2017  ? Procedure: TRANSURETHRAL RESECTION OF THE PROSTATE (TURP);  Surgeon: [name], MD;  Location: [facility];  Service: Urology;  Laterality: N/A;  ? ULNAR TUNNEL RELEASE Bilateral 2012;  2009  ? WRIST SURGERY Left 2013  ? no hardware  ? ? ?There were no vitals filed for this visit. ? ? Subjective Assessment - 09/28/21 1358   ? ? Subjective No new complaints.  Scitica bothering hime more han knee (since surgery).   ? Pertinent History left TKA (2022)   ? Limitations Standing;Walking   ? How long can you walk comfortably? 5-10 minutes   ? Patient Stated Goals go shopping, hiking   ? Currently in Pain? Yes   ? Pain Score 4    ? Pain Location Knee   ? Pain Orientation Right   ? Pain Descriptors / Indicators Aching;Throbbing   ? Pain Type Surgical pain   ? Pain Onset More than a month ago   ? Pain Frequency Constant   ? ?  ?  ? ?  ? ? ? ? ? [facility] PT Assessment - 09/28/21 0001   ? ?  ? AROM  ? Right Knee Flexion  110   ?  ? PROM  ? Right Knee Flexion 120   ? ?  ?  ? ?  ? ? ? ? ? ? ? ? ? ? ? ? ? ? ? ? [facility] Adult PT Treatment/Exercise - 09/28/21 0001   ? ?  ? Exercises  ? Exercises Knee/Hip   ?  ? Knee/Hip Exercises: Aerobic  ? Recumbent Bike Level progressing to seat 8 x 15 minutes.   ?  ? Knee/Hip Exercises: Machines for Strengthening  ? Cybex Knee Extension 10# x 3 minutes.   ? Cybex Knee Flexion 30# x 3 minutes.   ?  ? Vasopneumatic  ? Number Minutes Vasopneumatic  15 minutes   ? Vasopnuematic Location  --   Right knee.  ? Vasopneumatic Pressure Low   ?  ? Manual Therapy  ? Manual Therapy Passive ROM   ? Passive ROM In supine:  2 minute sustained right knee flexion stretch.   ? ?  ?  ? ?  ? ? ? ? ? ? ? ? ? ? ? ? ? ? ? PT Long Term Goals - 09/19/21 1450   ? ?  ? PT LONG TERM GOAL #1  ? Title Patient will be independent with his HEP.   ? Time 4   ? Period Weeks   ? Status  New   ? Target Date 10/17/21   ?  ? PT LONG TERM GOAL #2  ? Title Patient will be able to demonstrate at least 120 degrees of active right knee flexion for improved function navigating steps.   ? Time 4   ? Period Weeks   ? Status New   ? Target Date 10/17/21   ?  ? PT LONG TERM GOAL #3  ? Title Patient will be able to demonstrate active knee extension on the right knee within 5 degrees of neutral for improved gait mechanics.   ? Time 4   ? Period Weeks   ? Status New   ? Target Date 10/17/21   ?  ? PT LONG TERM GOAL #4  ? Title Patient will be able to walk at least 20 minutes without being limited by his right knee pain.   ? Time 4   ? Period Weeks   ? Status New   ? Target Date 10/17/21   ? ?  ?  ? ?  ? ? ? ? ? ? ? ? Plan - 09/28/21 1424   ? ? Clinical Impression Statement Patient is progressing well but states his Sciatica has been bothering him since his knee surgery and he will be seeing Dr. Nelva Bush about this.  He achieved active right knee flexion to 110 degrees and 120 degrees passive.   ? Personal Factors and Comorbidities Other   ? Examination-Activity Limitations Locomotion Level;Transfers;Stand;Lift;Stairs;Dressing;Squat;Sleep   ? Examination-Participation Restrictions Occupation;Community Activity;Driving;Shop;Yard Work   ? Stability/Clinical Decision Making Stable/Uncomplicated   ? Rehab Potential Good   ? PT Frequency 3x / week   ? PT Duration 4 weeks   ? PT Treatment/Interventions ADLs/Self Care Home Management;Cryotherapy;Electrical Stimulation;Moist Heat;Neuromuscular re-education;Therapeutic exercise;Therapeutic activities;Functional mobility training;Stair training;Gait training;Patient/family education;Manual techniques;Passive range of motion;Taping;Vasopneumatic Device   ? PT Next Visit Plan Recumbent bike.   ? Consulted and Agree with Plan of Care Patient   ? ?  ?  ? ?  ? ? ?Patient will benefit from skilled therapeutic intervention in order to improve the following deficits and impairments:   Abnormal gait, Decreased range of motion,  Difficulty walking, Pain, Decreased activity tolerance, Hypomobility, Impaired flexibility, Decreased strength, Decreased mobility ? ?Visit Diagnosis: ?Acute pain of right knee ? ?Stiffness of right knee, not elsewhere classified ? ?Chronic pain of left knee ? ? ? ? ?Problem List ?Patient Active Problem List  ? Diagnosis Date Noted  ? 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 S/P total knee replacement 07/15/2020  ? 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 BPH with obstruction/lower urinary tract symptoms 11/20/2017  ? S/P hernia repair 12/27/2015  ? ? ?Treyana Sturgell, [place], PT ?09/28/2021, 2:35 PM ? ?[place] ?[facility] ?[place] ?[place], [place], [address] ?Phone: [phone]   Fax:  [phone] ? ?Name: Joshua Bowers ?MRN: [id number] ?Date of Birth: 12-11-1950 ? ? ? ?

## 2021-09-29 ENCOUNTER — Encounter: Payer: Medicare Other | Admitting: Physical Therapy

## 2021-10-03 ENCOUNTER — Ambulatory Visit: Payer: Medicare Other

## 2021-10-03 DIAGNOSIS — M25561 Pain in right knee: Secondary | ICD-10-CM | POA: Diagnosis not present

## 2021-10-03 DIAGNOSIS — M25661 Stiffness of right knee, not elsewhere classified: Secondary | ICD-10-CM

## 2021-10-03 NOTE — Therapy (Signed)
[place] ?[facility] ?[place] ?[place], [place], [address] ?Phone: [phone]   Fax:  [phone] ? ?Physical Therapy Treatment ? ?Patient Details  ?Name: Joshua Bowers ?MRN: [id number] ?Date of Birth: Dec 14, 1950 ?Referring Provider (PT): Phoebe Sharps, [place] ? ? ?Encounter Date: 10/03/2021 ? ? PT End of Session - 10/03/21 1347   ? ? Visit Number 6   ? Number of Visits 12   ? Date for PT Re-Evaluation 10/21/21   ? PT Start Time 1345   ? PT Stop Time 6195   ? PT Time Calculation (min) 46 min   ? Activity Tolerance Patient tolerated treatment well   ? Behavior During Therapy [facility] for tasks assessed/performed   ? ?  ?  ? ?  ? ? ?Past Medical History:  ?Diagnosis Date  ? 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 Bladder outlet obstruction   ? BPH (benign prostatic hyperplasia)   ? Elevated PSA   ? per pt 2013  prostate bx negative for cancer  ? Exposure to asbestos   ? per pt followed by a law firm in [place],  told last CT 2018 okay  ? GERD (gastroesophageal reflux disease)   ? History of bladder cancer   ? per pt 2011-- s/p  TURBT superficial per pt  ? Hypertension   ? Incomplete right bundle branch block   ? Lower urinary tract symptoms (LUTS)   ? ? ?Past Surgical History:  ?Procedure Laterality Date  ? APPENDECTOMY  age 34s  ? INGUINAL HERNIA REPAIR Right 12/27/2015  ? Procedure: HERNIA REPAIR RIGHT INGUINAL ADULT WITH MESH;  Surgeon: [name], MD;  Location: [place];  Service: General;  Laterality: Right;  ? INSERTION OF MESH Right 12/27/2015  ? Procedure: INSERTION OF MESH;  Surgeon: [name], MD;  Location: [place];  Service: General;  Laterality: Right;  ? TONSILLECTOMY  age 27  ? TOTAL KNEE ARTHROPLASTY Left 07/14/2020  ? Procedure: TOTAL KNEE ARTHROPLASTY;  Surgeon: [name], MD;  Location: [facility];  Service: Orthopedics;  Laterality: Left;  2.5 hrs  ? TOTAL KNEE ARTHROPLASTY Right 08/24/2021  ? Procedure: TOTAL KNEE ARTHROPLASTY;  Surgeon:  [name], MD;  Location: [facility];  Service: Orthopedics;  Laterality: Right;  ? TRANSURETHRAL RESECTION OF PROSTATE N/A 11/20/2017  ? Procedure: TRANSURETHRAL RESECTION OF THE PROSTATE (TURP);  Surgeon: [name], MD;  Location: [facility];  Service: Urology;  Laterality: N/A;  ? ULNAR TUNNEL RELEASE Bilateral 2012;  2009  ? WRIST SURGERY Left 2013  ? no hardware  ? ? ?There were no vitals filed for this visit. ? ? Subjective Assessment - 10/03/21 1346   ? ? Subjective Patient reports that his knee is not hurting, but his sciatica has been bothering him more at night.   ? Pertinent History left TKA (2022)   ? Limitations Standing;Walking   ? How long can you walk comfortably? 5-10 minutes   ? Patient Stated Goals go shopping, hiking   ? Currently in Pain? No/denies   ? Pain Onset More than a month ago   ? ?  ?  ? ?  ? ? ? ? ? ? ? ? ? ? ? ? ? ? ? ? ? ? ? ? [place] Adult PT Treatment/Exercise - 10/03/21 0001   ? ?  ? Knee/Hip Exercises: Stretches  ? Passive Hamstring Stretch Right;4 reps;30 seconds   ?  ? Knee/Hip Exercises: Aerobic  ? Recumbent Bike L4  x 15 minutes; seat 5-4   ?  ? Knee/Hip Exercises: Machines for Strengthening  ? Cybex Knee Extension 20# x 2 minutes   ? Cybex Knee Flexion 40# x 2 minutes   ?  ? Knee/Hip Exercises: Standing  ? Forward Step Up Both;Hand Hold: 2;Step Height: 6";10 reps   ? Rocker Board 4 minutes   ?  ? Modalities  ? Modalities Vasopneumatic   ?  ? Vasopneumatic  ? Number Minutes Vasopneumatic  10 minutes   ? Vasopnuematic Location  Knee   ? Vasopneumatic Pressure Low   ? Vasopneumatic Temperature  34   ? ?  ?  ? ?  ? ? ? ? ? ? ? ? ? ? ? ? ? ? ? PT Long Term Goals - 09/19/21 1450   ? ?  ? PT LONG TERM GOAL #1  ? Title Patient will be independent with his HEP.   ? Time 4   ? Period Weeks   ? Status New   ? Target Date 10/17/21   ?  ? PT LONG TERM GOAL #2  ? Title Patient will be able to demonstrate at least 120 degrees of active right knee flexion for improved function  navigating steps.   ? Time 4   ? Period Weeks   ? Status New   ? Target Date 10/17/21   ?  ? PT LONG TERM GOAL #3  ? Title Patient will be able to demonstrate active knee extension on the right knee within 5 degrees of neutral for improved gait mechanics.   ? Time 4   ? Period Weeks   ? Status New   ? Target Date 10/17/21   ?  ? PT LONG TERM GOAL #4  ? Title Patient will be able to walk at least 20 minutes without being limited by his right knee pain.   ? Time 4   ? Period Weeks   ? Status New   ? Target Date 10/17/21   ? ?  ?  ? ?  ? ? ? ? ? ? ? ? Plan - 10/03/21 1348   ? ? Clinical Impression Statement Patient was progressed with step ups for improved function needed to navigate his steps with a reciprocal pattern. He was able to demonstrate this when ascending steps with minimal difficulty with the right knee. However, left knee pain limited his ability ascending stairs. Descending stairs remained significantly difficult with both lower extremities. He reported that his knee felt good upon the conclusion of treatment. He continues to require skilled physical therapy to address his remaining impairments to return to his prior level of function.   ? Personal Factors and Comorbidities Other   ? Examination-Activity Limitations Locomotion Level;Transfers;Stand;Lift;Stairs;Dressing;Squat;Sleep   ? Examination-Participation Restrictions Occupation;Community Activity;Driving;Shop;Yard Work   ? Stability/Clinical Decision Making Stable/Uncomplicated   ? Rehab Potential Good   ? PT Frequency 3x / week   ? PT Duration 4 weeks   ? PT Treatment/Interventions ADLs/Self Care Home Management;Cryotherapy;Electrical Stimulation;Moist Heat;Neuromuscular re-education;Therapeutic exercise;Therapeutic activities;Functional mobility training;Stair training;Gait training;Patient/family education;Manual techniques;Passive range of motion;Taping;Vasopneumatic Device   ? PT Next Visit Plan Recumbent bike.   ? Consulted and Agree with Plan  of Care Patient   ? ?  ?  ? ?  ? ? ?Patient will benefit from skilled therapeutic intervention in order to improve the following deficits and impairments:  Abnormal gait, Decreased range of motion, Difficulty walking, Pain, Decreased activity tolerance, Hypomobility, Impaired flexibility, Decreased strength, Decreased mobility ? ?Visit  Diagnosis: ?Acute pain of right knee ? ?Stiffness of right knee, not elsewhere classified ? ? ? ? ?Problem List ?Patient Active Problem List  ? Diagnosis Date Noted  ? 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 S/P total knee replacement 07/15/2020  ? 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 BPH with obstruction/lower urinary tract symptoms 11/20/2017  ? S/P hernia repair 12/27/2015  ? ? ?Darlin Coco, PT ?10/03/2021, 2:33 PM ? ?[place] ?[facility] ?[place] ?[place], [place], [address] ?Phone: [phone]   Fax:  [phone] ? ?Name: Joshua Bowers ?MRN: [id number] ?Date of Birth: 04-09-1951 ? ? ? ?

## 2021-10-05 ENCOUNTER — Ambulatory Visit: Payer: Medicare Other

## 2021-10-05 DIAGNOSIS — M25561 Pain in right knee: Secondary | ICD-10-CM

## 2021-10-05 DIAGNOSIS — M25661 Stiffness of right knee, not elsewhere classified: Secondary | ICD-10-CM

## 2021-10-05 NOTE — Therapy (Signed)
[place] ?[facility] ?[place] ?[place], [place], [address] ?Phone: [phone]   Fax:  [phone] ? ?Physical Therapy Treatment ? ?Patient Details  ?Name: Joshua Bowers ?MRN: [id number] ?Date of Birth: [birth year] ?Referring Provider (PT): Phoebe Sharps, [place] ? ? ?Encounter Date: 10/05/2021 ? ? PT End of Session - 10/05/21 1348   ? ? Visit Number 7   ? Number of Visits 12   ? Date for PT Re-Evaluation 10/21/21   ? PT Start Time 1345   ? PT Stop Time 1435   ? PT Time Calculation (min) 50 min   ? Activity Tolerance Patient tolerated treatment well   ? Behavior During Therapy [facility] for tasks assessed/performed   ? ?  ?  ? ?  ? ? ?Past Medical History:  ?Diagnosis Date  ? 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 Bladder outlet obstruction   ? BPH (benign prostatic hyperplasia)   ? Elevated PSA   ? per pt 2013  prostate bx negative for cancer  ? Exposure to asbestos   ? per pt followed by a law firm in [place],  told last CT 2018 okay  ? GERD (gastroesophageal reflux disease)   ? History of bladder cancer   ? per pt 2011-- s/p  TURBT superficial per pt  ? Hypertension   ? Incomplete right bundle branch block   ? Lower urinary tract symptoms (LUTS)   ? ? ?Past Surgical History:  ?Procedure Laterality Date  ? APPENDECTOMY  age 71s  ? INGUINAL HERNIA REPAIR Right 12/27/2015  ? Procedure: HERNIA REPAIR RIGHT INGUINAL ADULT WITH MESH;  Surgeon: [name], MD;  Location: [place];  Service: General;  Laterality: Right;  ? INSERTION OF MESH Right 12/27/2015  ? Procedure: INSERTION OF MESH;  Surgeon: [name], MD;  Location: [place];  Service: General;  Laterality: Right;  ? TONSILLECTOMY  age 71  ? TOTAL KNEE ARTHROPLASTY Left 07/14/2020  ? Procedure: TOTAL KNEE ARTHROPLASTY;  Surgeon: [name], MD;  [facility];  Service: Orthopedics;  Laterality: Left;  2.5 hrs  ? TOTAL KNEE ARTHROPLASTY Right 08/24/2021  ? Procedure: TOTAL KNEE ARTHROPLASTY;  Surgeon:  [name], MD;  [facility];  Service: Orthopedics;  Laterality: Right;  ? TRANSURETHRAL RESECTION OF PROSTATE N/A 11/20/2017  ? Procedure: TRANSURETHRAL RESECTION OF THE PROSTATE (TURP);  Surgeon: [name], MD;  Location: [facility];  Service: Urology;  Laterality: N/A;  ? ULNAR TUNNEL RELEASE Bilateral 2012;  2009  ? WRIST SURGERY Left 2013  ? no hardware  ? ? ?There were no vitals filed for this visit. ? ? Subjective Assessment - 10/05/21 1348   ? ? Subjective Pt arrives for today's treatment session denying any knee pain, but sciatica continues to wake him up at night.   ? Pertinent History left TKA (2022)   ? Limitations Standing;Walking   ? How long can you walk comfortably? 5-10 minutes   ? Patient Stated Goals go shopping, hiking   ? Currently in Pain? No/denies   ? Pain Onset More than a month ago   ? ?  ?  ? ?  ? ? ? ? ? ? ? ? ? ? ? ? ? ? ? ? ? ? ? ? [place] Adult PT Treatment/Exercise - 10/05/21 0001   ? ?  ? Knee/Hip Exercises: Aerobic  ? Recumbent Bike Lvl 5 x 15 mins   ?  ? Knee/Hip Exercises: Machines for Strengthening  ?  Cybex Knee Extension 30# x 2 mins   ? Cybex Knee Flexion 50# x 2 mins   ? Cybex Leg Press 2 plates 25 reps; seat   ?  ? Modalities  ? Modalities Electrical Stimulation;Vasopneumatic   ?  ? Electrical Stimulation  ? Electrical Stimulation Location Right knee   ? Electrical Stimulation Action IFC 80-150 Hz   ? Electrical Stimulation Parameters 40% scan x 15 mins   ? Electrical Stimulation Goals Edema;Pain   ?  ? Vasopneumatic  ? Number Minutes Vasopneumatic  15 minutes   ? Vasopnuematic Location  Knee   ? Vasopneumatic Pressure Low   ? Vasopneumatic Temperature  34   ?  ? Manual Therapy  ? Manual Therapy Passive ROM   ? Passive ROM In supine: into flexion and extension to end range with gentle hold to increase ROM   ? ?  ?  ? ?  ? ? ? ? ? ? ? ? ? ? ? ? ? ? ? PT Long Term Goals - 10/05/21 1406   ? ?  ? PT LONG TERM GOAL #1  ? Title Patient will be independent  with his HEP.   ? Time 4   ? Period Weeks   ? Status On-going   ? Target Date 10/17/21   ?  ? PT LONG TERM GOAL #2  ? Title Patient will be able to demonstrate at least 120 degrees of active right knee flexion for improved function navigating steps.   ? Baseline 10/05/21: 125 degrees PROM   ? Time 4   ? Period Weeks   ? Status New   ? Target Date 10/17/21   ?  ? PT LONG TERM GOAL #3  ? Title Patient will be able to demonstrate active knee extension on the right knee within 5 degrees of neutral for improved gait mechanics.   ? Baseline 10/05/21: -7 degrees PROM   ? Time 4   ? Period Weeks   ? Status New   ? Target Date 10/17/21   ?  ? PT LONG TERM GOAL #4  ? Title Patient will be able to walk at least 20 minutes without being limited by his right knee pain.   ? Baseline 10/05/21: 15 mins   ? Time 4   ? Period Weeks   ? Status New   ? Target Date 10/17/21   ? ?  ?  ? ?  ? ? ? ? ? ? ? ? Plan - 10/05/21 1349   ? ? Clinical Impression Statement Pt arrives for today's treamtent session denying any pain.  Pt able to tolerate increased weight with cybex knee extension and flexion.  Pt introduced to cybex leg press without issues.  PROM performed into knee flexion and extension to increase ROM.  Pt able to demontrate 125 degrees of passive flexion and -7 degrees of extension.  Pt would benefit from hamstring stretches at next session.  Normal responses to estim and vaso noted upon removal.  Pt denied any pain at completion of today's treatment session.   ? Personal Factors and Comorbidities Other   ? Examination-Activity Limitations Locomotion Level;Transfers;Stand;Lift;Stairs;Dressing;Squat;Sleep   ? Examination-Participation Restrictions Occupation;Community Activity;Driving;Shop;Yard Work   ? Stability/Clinical Decision Making Stable/Uncomplicated   ? Rehab Potential Good   ? PT Frequency 3x / week   ? PT Duration 4 weeks   ? PT Treatment/Interventions ADLs/Self Care Home Management;Cryotherapy;Electrical Stimulation;Moist  Heat;Neuromuscular re-education;Therapeutic exercise;Therapeutic activities;Functional mobility training;Stair training;Gait training;Patient/family education;Manual techniques;Passive range of motion;Taping;Vasopneumatic Device   ?  PT Next Visit Plan Recumbent bike.   ? Consulted and Agree with Plan of Care Patient   ? ?  ?  ? ?  ? ? ?Patient will benefit from skilled therapeutic intervention in order to improve the following deficits and impairments:  Abnormal gait, Decreased range of motion, Difficulty walking, Pain, Decreased activity tolerance, Hypomobility, Impaired flexibility, Decreased strength, Decreased mobility ? ?Visit Diagnosis: ?Acute pain of right knee ? ?Stiffness of right knee, not elsewhere classified ? ? ? ? ?Problem List ?Patient Active Problem List  ? Diagnosis Date Noted  ? 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 S/P total knee replacement 07/15/2020  ? 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 BPH with obstruction/lower urinary tract symptoms 11/20/2017  ? S/P hernia repair 12/27/2015  ? ? ?Kathrynn Ducking, PTA ?10/05/2021, 2:35 PM ? ?[place] ?[facility] ?[place] ?[place], [place], [address] ?Phone: [phone]   Fax:  [phone] ? ?Name: ESPIRIDION SUPINSKI ?MRN: [id number] ?Date of Birth: [birth year] ? ? ? ?

## 2021-10-10 ENCOUNTER — Ambulatory Visit: Payer: Medicare Other

## 2021-10-10 DIAGNOSIS — M25561 Pain in right knee: Secondary | ICD-10-CM | POA: Diagnosis not present

## 2021-10-10 DIAGNOSIS — M25661 Stiffness of right knee, not elsewhere classified: Secondary | ICD-10-CM

## 2021-10-10 NOTE — Therapy (Signed)
[facility] [place], [place], [address] Phone: [phone]   Fax:  [phone]  Physical Therapy Treatment  Patient Details  Name: Joshua Bowers MRN: [id number] Date of Birth: [birth year] Referring Provider (PT): [place], [place]   Encounter Date: 10/10/2021   PT End of Session - 10/10/21 1353     Visit Number 8    Number of Visits 12    Date for PT Re-Evaluation 10/21/21    PT Start Time 7673    PT Stop Time 4193    PT Time Calculation (min) 50 min    Activity Tolerance Patient tolerated treatment well    Behavior During Therapy [facility] for tasks assessed/performed             Past Medical History:  Diagnosis Date   Arthritis    hands   Bladder outlet obstruction    BPH (benign prostatic hyperplasia)    Elevated PSA    per pt 2013  prostate bx negative for cancer   Exposure to asbestos    per pt followed by a law firm in [place],  told last CT 2018 okay   GERD (gastroesophageal reflux disease)    History of bladder cancer    per pt 2011-- s/p  TURBT superficial per pt   Hypertension    Incomplete right bundle branch block    Lower urinary tract symptoms (LUTS)     Past Surgical History:  Procedure Laterality Date   APPENDECTOMY  age 20s   [place] Right 12/27/2015   Procedure: HERNIA REPAIR RIGHT INGUINAL ADULT WITH MESH;  Surgeon: [name], MD;  Location: [place];  Service: General;  Laterality: Right;   INSERTION OF MESH Right 12/27/2015   Procedure: INSERTION OF MESH;  Surgeon: [name], MD;  Location: [place];  Service: General;  Laterality: Right;   TONSILLECTOMY  age 17   TOTAL KNEE ARTHROPLASTY Left 07/14/2020   Procedure: TOTAL KNEE ARTHROPLASTY;  Surgeon: [name], MD;  Location[facility];  Service: Orthopedics;  Laterality: Left;  2.5 hrs   TOTAL KNEE ARTHROPLASTY Right 08/24/2021   Procedure: TOTAL KNEE ARTHROPLASTY;  Surgeon:  [name], MD;  [facility];  Service: Orthopedics;  Laterality: Right;   TRANSURETHRAL RESECTION OF PROSTATE N/A 11/20/2017   Procedure: TRANSURETHRAL RESECTION OF THE PROSTATE (TURP);  Surgeon: [name], MD;  Location: [facility];  Service: Urology;  Laterality: N/A;   ULNAR TUNNEL RELEASE Bilateral 2012;  2009   WRIST SURGERY Left 2013   no hardware    There were no vitals filed for this visit.   Subjective Assessment - 10/10/21 1354     Subjective Patient reports that his knee is sore today because he tried to weed eat his yard on Saturday. He notes that he could hardly walk after doing this, but it felt ok until he sat down.    Pertinent History left TKA (2022)    Limitations Standing;Walking    How long can you walk comfortably? 5-10 minutes    Patient Stated Goals go shopping, hiking    Currently in Pain? No/denies    Pain Onset More than a month ago                               [facility] Adult PT Treatment/Exercise - 10/10/21 0001       Knee/Hip Exercises: Stretches   Gastroc  Stretch Right;3 reps;30 seconds      Knee/Hip Exercises: Aerobic   Recumbent Bike Lvl 5 x 15 mins      Knee/Hip Exercises: Machines for Strengthening   Cybex Knee Extension 30# x 2 minutes    Cybex Knee Flexion 50# x 2 minutes      Knee/Hip Exercises: Standing   Rocker Board 5 minutes      Modalities   Modalities Vasopneumatic      Vasopneumatic   Number Minutes Vasopneumatic  15 minutes    Vasopnuematic Location  Knee    Vasopneumatic Pressure Low    Vasopneumatic Temperature  34                          PT Long Term Goals - 10/05/21 1406       PT LONG TERM GOAL #1   Title Patient will be independent with his HEP.    Time 4    Period Weeks    Status On-going    Target Date 10/17/21      PT LONG TERM GOAL #2   Title Patient will be able to demonstrate at least 120 degrees of active right knee flexion for improved  function navigating steps.    Baseline 10/05/21: 125 degrees PROM    Time 4    Period Weeks    Status New    Target Date 10/17/21      PT LONG TERM GOAL #3   Title Patient will be able to demonstrate active knee extension on the right knee within 5 degrees of neutral for improved gait mechanics.    Baseline 10/05/21: -7 degrees PROM    Time 4    Period Weeks    Status New    Target Date 10/17/21      PT LONG TERM GOAL #4   Title Patient will be able to walk at least 20 minutes without being limited by his right knee pain.    Baseline 10/05/21: 15 mins    Time 4    Period Weeks    Status New    Target Date 10/17/21                   Plan - 10/10/21 1359     Clinical Impression Statement Patient presented to treatment with increased irritability due to the yardwork he performed over the weekend. This resulted in treatment focusing on familiar interventions for imprved knee strength and mobility. He required minimal cueing with standing gastroc stretching to facilitate improved soft tissue extensibility. He reported that his knee felt better upon the conclusion of treatment. He continues to require skilled physical therapy to address her remaining impairments to return to his prior level of function.    Personal Factors and Comorbidities Other    Examination-Activity Limitations Locomotion Level;Transfers;Stand;Lift;Stairs;Dressing;Squat;Sleep    Examination-Participation Restrictions Occupation;Community Activity;Driving;Shop;Yard Work    Stability/Clinical Decision Making Stable/Uncomplicated    Rehab Potential Good    PT Frequency 3x / week    PT Duration 4 weeks    PT Treatment/Interventions ADLs/Self Care Home Management;Cryotherapy;Electrical Stimulation;Moist Heat;Neuromuscular re-education;Therapeutic exercise;Therapeutic activities;Functional mobility training;Stair training;Gait training;Patient/family education;Manual techniques;Passive range of  motion;Taping;Vasopneumatic Device    PT Next Visit Plan Recumbent bike.    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

## 2021-10-12 ENCOUNTER — Ambulatory Visit: Payer: Medicare Other

## 2021-10-12 DIAGNOSIS — M25661 Stiffness of right knee, not elsewhere classified: Secondary | ICD-10-CM

## 2021-10-12 DIAGNOSIS — M25561 Pain in right knee: Secondary | ICD-10-CM | POA: Diagnosis not present

## 2021-10-12 NOTE — Therapy (Signed)
[facility] [place], [place], [address] Phone: [phone]   Fax:  [phone]  Physical Therapy Treatment  Patient Details  Name: Joshua Bowers MRN: [id number] Date of Birth: September 17, 1950 Referring Provider (PT): [place], [place]   Encounter Date: 10/12/2021   PT End of Session - 10/12/21 1350     Visit Number 9    Number of Visits 12    Date for PT Re-Evaluation 10/21/21    PT Start Time 8182    PT Stop Time 9937    PT Time Calculation (min) 57 min    Activity Tolerance Patient tolerated treatment well    Behavior During Therapy [facility] for tasks assessed/performed             Past Medical History:  Diagnosis Date   Arthritis    hands   Bladder outlet obstruction    BPH (benign prostatic hyperplasia)    Elevated PSA    per pt 2013  prostate bx negative for cancer   Exposure to asbestos    per pt followed by a law firm in [place],  told last CT 2018 okay   GERD (gastroesophageal reflux disease)    History of bladder cancer    per pt 2011-- s/p  TURBT superficial per pt   Hypertension    Incomplete right bundle branch block    Lower urinary tract symptoms (LUTS)     Past Surgical History:  Procedure Laterality Date   APPENDECTOMY  age 77s   [place] Right 12/27/2015   Procedure: HERNIA REPAIR RIGHT INGUINAL ADULT WITH MESH;  Surgeon: [name], MD;  Location: [place];  Service: General;  Laterality: Right;   INSERTION OF MESH Right 12/27/2015   Procedure: INSERTION OF MESH;  Surgeon: [name], MD;  Location: [place];  Service: General;  Laterality: Right;   TONSILLECTOMY  age 43   TOTAL KNEE ARTHROPLASTY Left 07/14/2020   Procedure: TOTAL KNEE ARTHROPLASTY;  Surgeon: [name], MD;  Location: [facility];  Service: Orthopedics;  Laterality: Left;  2.5 hrs   TOTAL KNEE ARTHROPLASTY Right 08/24/2021   Procedure: TOTAL KNEE ARTHROPLASTY;  Surgeon:  [name], MD;  Location: [facility];  Service: Orthopedics;  Laterality: Right;   TRANSURETHRAL RESECTION OF PROSTATE N/A 11/20/2017   Procedure: TRANSURETHRAL RESECTION OF THE PROSTATE (TURP);  Surgeon: [name], MD;  Location: [facility];  Service: Urology;  Laterality: N/A;   ULNAR TUNNEL RELEASE Bilateral 2012;  2009   WRIST SURGERY Left 2013   no hardware    There were no vitals filed for this visit.   Subjective Assessment - 10/12/21 1350     Subjective Pt arrives for today's treamtent session denying any pain. Pt reports that he got a shot in his back yesterday and is feeling much better.    Pertinent History left TKA (2022)    Limitations Standing;Walking    How long can you walk comfortably? 5-10 minutes    Patient Stated Goals go shopping, hiking    Currently in Pain? No/denies    Pain Onset More than a month ago                               [facility] Adult PT Treatment/Exercise - 10/12/21 0001       Knee/Hip Exercises: Aerobic   Recumbent Bike Lvl 5 x 15 mins  Knee/Hip Exercises: Machines for Strengthening   Cybex Knee Extension 30# x 30 reps    Cybex Knee Flexion 50# x 30 reps    Cybex Leg Press 3 plates x 30 reps      Knee/Hip Exercises: Standing   Rocker Board 5 minutes      Modalities   Modalities Vasopneumatic;Electrical Stimulation      Electrical Stimulation   Electrical Stimulation Location Right knee    Electrical Stimulation Action IFC 80-150 Hz    Electrical Stimulation Parameters 40% scan x 15 mins    Electrical Stimulation Goals Edema;Pain      Vasopneumatic   Number Minutes Vasopneumatic  15 minutes    Vasopnuematic Location  Knee    Vasopneumatic Pressure Low    Vasopneumatic Temperature  34                          PT Long Term Goals - 10/12/21 1416       PT LONG TERM GOAL #1   Title Patient will be independent with his HEP.    Time 4    Period Weeks    Status On-going     Target Date 10/17/21      PT LONG TERM GOAL #2   Title Patient will be able to demonstrate at least 120 degrees of active right knee flexion for improved function navigating steps.    Baseline 10/05/21: 125 degrees PROM; 10/12/21: 120 active right knee flexion    Time 4    Period Weeks    Status New    Target Date 10/17/21      PT LONG TERM GOAL #3   Title Patient will be able to demonstrate active knee extension on the right knee within 5 degrees of neutral for improved gait mechanics.    Baseline 10/05/21: -7 degrees PROM: 10/12/21: -4 degrees active knee extension    Time 4    Period Weeks    Status New    Target Date 10/17/21      PT LONG TERM GOAL #4   Title Patient will be able to walk at least 20 minutes without being limited by his right knee pain.    Baseline 10/05/21: 15 mins    Time 4    Period Weeks    Status Achieved    Target Date 10/17/21                   Plan - 10/12/21 1351     Clinical Impression Statement Pt arrives for today's treatment session denying any pain.  Pt reports getting a shot in his back yesterday and it is feeling much better.  Pt able to tolerate increased resistance with all cybex exercises during today's treatment session.  Pt is making progress toward all of his goals as he can demonstrate 120 degrees of active flexion and -4 degrees of active extension.  Normal responses to estim and vaso noted upon removal.  Pt denied any pain at completion of today's treatment session.    Personal Factors and Comorbidities Other    Examination-Activity Limitations Locomotion Level;Transfers;Stand;Lift;Stairs;Dressing;Squat;Sleep    Examination-Participation Restrictions Occupation;Community Activity;Driving;Shop;Yard Work    Stability/Clinical Decision Making Stable/Uncomplicated    Rehab Potential Good    PT Frequency 3x / week    PT Duration 4 weeks    PT Treatment/Interventions ADLs/Self Care Home Management;Cryotherapy;Electrical  Stimulation;Moist Heat;Neuromuscular re-education;Therapeutic exercise;Therapeutic activities;Functional mobility training;Stair training;Gait training;Patient/family education;Manual techniques;Passive range of motion;Taping;Vasopneumatic Device  PT Next Visit Plan Recumbent bike.    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 [place], [place], [address] Phone: [phone]   Fax:  [phone]  Name: Joshua Bowers MRN: [id number] Date of Birth: 03-22-1951

## 2021-10-18 ENCOUNTER — Ambulatory Visit: Payer: Medicare Other

## 2021-10-18 DIAGNOSIS — M25661 Stiffness of right knee, not elsewhere classified: Secondary | ICD-10-CM

## 2021-10-18 DIAGNOSIS — M25561 Pain in right knee: Secondary | ICD-10-CM | POA: Diagnosis not present

## 2021-10-18 NOTE — Therapy (Addendum)
[facility] [place], [place], [address] Phone: [phone]   Fax:  [phone]  Physical Therapy Treatment  Patient Details  Name: Joshua Bowers MRN: [id number] Date of Birth: Mar 27, 1951 Referring Provider (PT): [place], [place]   Encounter Date: 10/18/2021   PT End of Session - 10/18/21 1349     Visit Number 10    Number of Visits 12    Date for PT Re-Evaluation 10/21/21    PT Start Time 6270    PT Stop Time 3500    PT Time Calculation (min) 58 min    Activity Tolerance Patient tolerated treatment well    Behavior During Therapy [facility] for tasks assessed/performed             Past Medical History:  Diagnosis Date   Arthritis    hands   Bladder outlet obstruction    BPH (benign prostatic hyperplasia)    Elevated PSA    per pt 2013  prostate bx negative for cancer   Exposure to asbestos    per pt followed by a law firm in [place],  told last CT 2018 okay   GERD (gastroesophageal reflux disease)    History of bladder cancer    per pt 2011-- s/p  TURBT superficial per pt   Hypertension    Incomplete right bundle branch block    Lower urinary tract symptoms (LUTS)     Past Surgical History:  Procedure Laterality Date   APPENDECTOMY  age 71s   [place] Right 12/27/2015   Procedure: HERNIA REPAIR RIGHT INGUINAL ADULT WITH MESH;  Surgeon: [name], MD;  Location: [place];  Service: General;  Laterality: Right;   INSERTION OF MESH Right 12/27/2015   Procedure: INSERTION OF MESH;  Surgeon: [name], MD;  Location: [place];  Service: General;  Laterality: Right;   TONSILLECTOMY  age 71   TOTAL KNEE ARTHROPLASTY Left 07/14/2020   Procedure: TOTAL KNEE ARTHROPLASTY;  Surgeon: [name], MD;  Location: [facility];  Service: Orthopedics;  Laterality: Left;  2.5 hrs   TOTAL KNEE ARTHROPLASTY Right 08/24/2021   Procedure: TOTAL KNEE ARTHROPLASTY;  Surgeon:  [name], MD;  [facility];  Service: Orthopedics;  Laterality: Right;   TRANSURETHRAL RESECTION OF PROSTATE N/A 11/20/2017   Procedure: TRANSURETHRAL RESECTION OF THE PROSTATE (TURP);  Surgeon: [name], MD;  Location: [facility];  Service: Urology;  Laterality: N/A;   ULNAR TUNNEL RELEASE Bilateral 2012;  2009   WRIST SURGERY Left 2013   no hardware    There were no vitals filed for this visit.   Subjective Assessment - 10/18/21 1349     Subjective Pt arrives for today's treamtent session denying any pain. Pt reports that he feels he may be ready for discharge today.    Pertinent History left TKA (2022)    Limitations Standing;Walking    How long can you walk comfortably? 5-10 minutes    Patient Stated Goals go shopping, hiking    Currently in Pain? No/denies    Pain Onset More than a month ago                               [facility] Adult PT Treatment/Exercise - 10/18/21 0001       Knee/Hip Exercises: Aerobic   Recumbent Bike Lvl 5 x 15 mins      Knee/Hip Exercises: Machines for  Strengthening   Cybex Knee Extension 30# x 40 reps    Cybex Knee Flexion 50# x 40 reps    Cybex Leg Press 3 plates x 40 reps      Knee/Hip Exercises: Standing   Rocker Board 5 minutes      Modalities   Modalities Vasopneumatic;Electrical Stimulation      Electrical Stimulation   Electrical Stimulation Location Right knee    Electrical Stimulation Action IFC    Electrical Stimulation Parameters 80-150 Hz x 15 mins    Electrical Stimulation Goals Edema;Pain      Vasopneumatic   Number Minutes Vasopneumatic  15 minutes    Vasopnuematic Location  Knee    Vasopneumatic Pressure Low    Vasopneumatic Temperature  34                          PT Long Term Goals - 10/18/21 1357       PT LONG TERM GOAL #1   Title Patient will be independent with his HEP.    Time 4    Period Weeks    Status Achieved    Target Date 10/17/21       PT LONG TERM GOAL #2   Title Patient will be able to demonstrate at least 120 degrees of active right knee flexion for improved function navigating steps.    Baseline 10/05/21: 125 degrees PROM; 10/12/21: 120 active right knee flexion    Time 4    Period Weeks    Status Achieved    Target Date 10/17/21      PT LONG TERM GOAL #3   Title Patient will be able to demonstrate active knee extension on the right knee within 5 degrees of neutral for improved gait mechanics.    Baseline 10/05/21: -7 degrees PROM: 10/12/21: -4 degrees active knee extension    Time 4    Period Weeks    Status Achieved    Target Date 10/17/21      PT LONG TERM GOAL #4   Title Patient will be able to walk at least 20 minutes without being limited by his right knee pain.    Baseline 10/05/21: 15 mins; 10/18/21: one house without pain    Time 4    Period Weeks    Status Achieved    Target Date 10/17/21                   Plan - 10/18/21 1350     Clinical Impression Statement Pt arrives for today's treamtent session denying any pain. Pt states that he feels like he is ready to discharge today.  Pt reports that he has been able to go up and down his basement steps, reciprical pattern, without issue.  Pt has met all of his goals set forth for him at this time and has increased FOTO score to 66 today.  All questions encouraged and answered during treatment session.  Pt denied any pain at completion of today's treatment session.  Pt instructed to call facility with any issue or concerns.    Personal Factors and Comorbidities Other    Examination-Activity Limitations Locomotion Level;Transfers;Stand;Lift;Stairs;Dressing;Squat;Sleep    Examination-Participation Restrictions Occupation;Community Activity;Driving;Shop;Yard Work    Stability/Clinical Decision Making Stable/Uncomplicated    Rehab Potential Good    PT Frequency 3x / week    PT Duration 4 weeks    PT Treatment/Interventions ADLs/Self Care Home  Management;Cryotherapy;Electrical Stimulation;Moist Heat;Neuromuscular re-education;Therapeutic exercise;Therapeutic activities;Functional mobility training;Stair training;Gait training;Patient/family  education;Manual techniques;Passive range of motion;Taping;Vasopneumatic Device    PT Next Visit Plan Recumbent bike.    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   S/P total knee replacement 07/15/2020   S/P TKR (total knee replacement) using cement 07/14/2020   BPH with obstruction/lower urinary tract symptoms 11/20/2017   S/P hernia repair 12/27/2015    Kathrynn Ducking, PTA 10/18/2021, 2:43 PM  [facility] [address] [place], [place], [address] Phone: [phone]   Fax:  [phone]  Name: TAHJAY BINION MRN: [id number] Date of Birth: 11-Feb-1951  PHYSICAL THERAPY DISCHARGE SUMMARY  Visits from Start of Care: 10  Current functional level related to goals / functional outcomes: Patient was able to meet all their goals for physical therapy and they felt comfortable being discharged at this time.    Remaining deficits: None    Education / Equipment: HEP    Patient agrees to discharge. Patient goals were met. Patient is being discharged due to meeting the stated rehab goals.  Jacqulynn Cadet, PT, DPT

## 2022-07-08 ENCOUNTER — Emergency Department (HOSPITAL_COMMUNITY)
Admission: EM | Admit: 2022-07-08 | Discharge: 2022-07-08 | Disposition: A | Discharge status: Home / Self Care | Payer: Medicare Other | Attending: Emergency Medicine | Admitting: Emergency Medicine

## 2022-07-08 ENCOUNTER — Encounter (HOSPITAL_COMMUNITY): Payer: Self-pay

## 2022-07-08 DIAGNOSIS — Z7982 Long term (current) use of aspirin: Secondary | ICD-10-CM | POA: Insufficient documentation

## 2022-07-08 DIAGNOSIS — R319 Hematuria, unspecified: Secondary | ICD-10-CM | POA: Insufficient documentation

## 2022-07-08 LAB — COMPREHENSIVE METABOLIC PANEL
ALT: 22 U/L (ref 0–44)
AST: 24 U/L (ref 15–41)
Albumin: 4.2 g/dL (ref 3.5–5.0)
Alkaline Phosphatase: 62 U/L (ref 38–126)
Anion gap: 7 (ref 5–15)
BUN: 12 mg/dL (ref 8–23)
CO2: 26 mmol/L (ref 22–32)
Calcium: 8.7 mg/dL — ABNORMAL LOW (ref 8.9–10.3)
Chloride: 103 mmol/L (ref 98–111)
Creatinine, Ser: 0.99 mg/dL (ref 0.61–1.24)
GFR, Estimated: >60 mL/min (ref >60)
Glucose, Bld: 92 mg/dL (ref 70–99)
Potassium: 3.5 mmol/L (ref 3.5–5.1)
Sodium: 136 mmol/L (ref 135–145)
Total Bilirubin: 0.8 mg/dL (ref 0.3–1.2)
Total Protein: 7.4 g/dL (ref 6.5–8.1)

## 2022-07-08 LAB — URINALYSIS, ROUTINE W REFLEX MICROSCOPIC
Bilirubin Urine: NEGATIVE
Glucose, UA: NEGATIVE mg/dL
Hgb urine dipstick: NEGATIVE
Ketones, ur: NEGATIVE mg/dL
Leukocytes,Ua: NEGATIVE
Nitrite: NEGATIVE
Protein, ur: NEGATIVE mg/dL
Specific Gravity, Urine: 1.013 (ref 1.005–1.030)
pH: 7 (ref 5.0–8.0)

## 2022-07-08 LAB — CBC WITH DIFFERENTIAL/PLATELET
Abs Immature Granulocytes: 0.02 10*3/uL (ref 0.00–0.07)
Basophils Absolute: 0.1 10*3/uL (ref 0.0–0.1)
Basophils Relative: 1 %
Eosinophils Absolute: 0.2 10*3/uL (ref 0.0–0.5)
Eosinophils Relative: 2 %
HCT: 51.2 % (ref 39.0–52.0)
Hemoglobin: 17.2 g/dL — ABNORMAL HIGH (ref 13.0–17.0)
Immature Granulocytes: 0 %
Lymphocytes Relative: 22 %
Lymphs Abs: 1.6 10*3/uL (ref 0.7–4.0)
MCH: 31.4 pg (ref 26.0–34.0)
MCHC: 33.6 g/dL (ref 30.0–36.0)
MCV: 93.6 fL (ref 80.0–100.0)
Monocytes Absolute: 0.7 10*3/uL (ref 0.1–1.0)
Monocytes Relative: 10 %
Neutro Abs: 4.7 10*3/uL (ref 1.7–7.7)
Neutrophils Relative %: 65 %
Platelets: 313 10*3/uL (ref 150–400)
RBC: 5.47 MIL/uL (ref 4.22–5.81)
RDW: 12.9 % (ref 11.5–15.5)
WBC: 7.2 10*3/uL (ref 4.0–10.5)
nRBC: 0 % (ref 0.0–0.2)

## 2022-07-08 NOTE — ED Provider Notes (Signed)
[place] EMERGENCY DEPARTMENT AT [facility] Provider Note   CSN: [id number] Arrival date & time: 07/08/22  1053     History  Chief Complaint  Patient presents with   Hematuria    Joshua Bowers is a 72 y.o. male.   Hematuria     72 year old male with medical history significant for bladder outlet obstruction, BPH status post TURP with [facility], GERD, who presents to the emergency department with painless hematuria.  The patient states that he noticed blood in his urine over the past 2 days.  He has been passing clots as of this morning.  He denies any pain.  He denies any difficulty urinating.  He denies any fever, chills, dysuria, increased urinary frequency.  He denies any flank pain.  Additionally he did not take his home hydrochlorothiazide earlier this morning.  He denies any blurry vision, chest pain, shortness of breath, abdominal pain.  He was going to follow-up with [facility] outpatient but could not get in to be seen until Thursday and thus presents to the emergency department today for evaluation.  Home Medications Prior to Admission medications   Medication Sig Start Date End Date Taking? Authorizing Provider  aspirin EC 81 MG tablet Take 1 tablet (81 mg total) by mouth 2 (two) times daily after a meal. Day after surgery 08/24/21   [name], MD  aspirin EC 81 MG tablet Take 1 tablet (81 mg total) by mouth 2 (two) times daily after a meal. Day after surgery 08/24/21   [name], MD  docusate sodium (COLACE) 100 MG capsule Take 1 capsule (100 mg total) by mouth 2 (two) times daily as needed for mild constipation. 08/24/21   [name], MD  oxyCODONE (OXY IR/ROXICODONE) 5 MG immediate release tablet Take 1 tablet (5 mg total) by mouth every 4 (four) hours as needed for severe pain. 08/24/21   [name], MD  polyethylene glycol (MIRALAX / GLYCOLAX) 17 g packet Take 17 g by mouth daily. 08/24/21   [name], MD  tiZANidine (ZANAFLEX)  4 MG capsule Take 1 capsule (4 mg total) by mouth 3 (three) times daily as needed for muscle spasms. 08/24/21   [name], MD      Allergies    Patient has no known allergies.    Review of Systems   Review of Systems  Genitourinary:  Positive for hematuria.  All other systems reviewed and are negative.   Physical Exam Updated Vital Signs BP (!) 185/112 (BP Location: Left Arm)   Pulse 71   Temp 98.3 F (36.8 C) (Oral)   Resp 16   SpO2 98%  Physical Exam Vitals and nursing note reviewed.  Constitutional:      General: He is not in acute distress.    Appearance: He is well-developed.  HENT:     Head: Normocephalic and atraumatic.  Eyes:     Conjunctiva/sclera: Conjunctivae normal.  Cardiovascular:     Rate and Rhythm: Normal rate and regular rhythm.  Pulmonary:     Effort: Pulmonary effort is normal. No respiratory distress.     Breath sounds: Normal breath sounds.  Abdominal:     Palpations: Abdomen is soft.     Tenderness: There is no abdominal tenderness. There is no right CVA tenderness, left CVA tenderness, guarding or rebound.  Musculoskeletal:        General: No swelling.     Cervical back: Neck supple.  Skin:    General: Skin is warm and  dry.     Capillary Refill: Capillary refill takes less than 2 seconds.  Neurological:     General: No focal deficit present.     Mental Status: He is alert and oriented to person, place, and time. Mental status is at baseline.  Psychiatric:        Mood and Affect: Mood normal.     ED Results / Procedures / Treatments   Labs (all labs ordered are listed, but only abnormal results are displayed) Labs Reviewed  COMPREHENSIVE METABOLIC PANEL - Abnormal; Notable for the following components:      Result Value   Calcium 8.7 (*)    All other components within normal limits  CBC WITH DIFFERENTIAL/PLATELET - Abnormal; Notable for the following components:   Hemoglobin 17.2 (*)    All other components within normal limits   URINALYSIS, ROUTINE W REFLEX MICROSCOPIC    EKG None  Radiology No results found.  Procedures Procedures    Medications Ordered in ED Medications - No data to display  ED Course/ Medical Decision Making/ A&P                             Medical Decision Making    72 year old male with medical history significant for bladder outlet obstruction, BPH status post TURP with [facility], GERD, who presents to the emergency department with painless hematuria.  The patient states that he noticed blood in his urine over the past 2 days.  He has been passing clots as of this morning.  He denies any pain.  He denies any difficulty urinating.  He denies any fever, chills, dysuria, increased urinary frequency.  He denies any flank pain.  Additionally he did not take his home hydrochlorothiazide earlier this morning.  He denies any blurry vision, chest pain, shortness of breath, abdominal pain.  He was going to follow-up with [facility] outpatient but could not get in to be seen until Thursday and thus presents to the emergency department today for evaluation.  On arrival, the patient was afebrile, not tachycardic or tachypneic, hypertensive without symptoms blood pressure 94/115, saturating at 98% on room air.  The patient states that he forgot to take his antihypertensives today.  He has a normal physical exam.  He presents to the emergency room with painless hematuria.  He has a history of TURP and follows outpatient with [facility].  Was able to potentially get appointment as soon as Thursday.  On exam, no tenderness to palpation of the patient is able to void.  No concern for acute urinary retention at this time.  Patient denies any symptoms of urinary tract infection.  He has no CVA tenderness.  He has no fevers or chills.  His CMP was without evidence of AKI, no significant electrolyte abnormality, his CBC revealed no evidence of leukocytosis and his urinalysis was negative.   At this time, reassuring workup with no need for inpatient hospitalization or further consultation or evaluation.  Patient stable for discharge with plan for close outpatient urology follow-up.  DC Instructions: Your laboratory evaluation and physical exam were reassuring.  Follow-up with your urologist for your episode of painless hematuria today.  Return to the emergency department for any concern for sudden abdominal pain, inability to urinate as this could be a sign of acute urinary retention from clot production in your bladder.  You may benefit from an outpatient cystoscopy with urologist.  Final Clinical Impression(s) / ED Diagnoses  Final diagnoses:  Hematuria, unspecified type    Rx / DC Orders ED Discharge Orders     None         Regan Lemming, MD 07/08/22 1433

## 2022-07-08 NOTE — ED Provider Triage Note (Signed)
Emergency Medicine Provider Triage Evaluation Note  Joshua Bowers , a 72 y.o. male  was evaluated in triage.  Pt complains of hematuria.  Patient reports he has had hematuria for the last few days without any significant pain or tenderness in his abdomen.  Patient reports that he has not previously had hematuria like this about 11 years ago and that resulted in a cystoscopy finding a benign mass in his bladder.  Patient reports that he regularly follows up with his urologist but was unable to see them recently.  Patient denies any fevers, urethral discharge, abdominal pain, nausea, vomiting, diarrhea.  Patient does report that he had some weight loss but believes that this is more due to him increasing his exercise recently.  Review of Systems  Positive: As above Negative: As above  Physical Exam  BP (!) 193/101   Pulse 72   Temp 98.3 F (36.8 C) (Oral)   Resp 18   SpO2 98%  Gen:   Awake, no distress   Resp:  Normal effort  MSK:   Moves extremities without difficulty  Other:  No tenderness to palpation of abdomen  Medical Decision Making  Medically screening exam initiated at 12:18 PM.  Appropriate orders placed.  ASHISH KHOURI was informed that the remainder of the evaluation will be completed by another provider, this initial triage assessment does not replace that evaluation, and the importance of remaining in the ED until their evaluation is complete.     Luvenia Heller, PA-C 07/08/22 1219

## 2022-07-08 NOTE — ED Triage Notes (Signed)
Pt arrived via POV, c/o blood in urine. Denies any pain, states this has happened in the past and had growth in bladder.

## 2022-07-08 NOTE — Discharge Instructions (Addendum)
Your laboratory evaluation and physical exam were reassuring.  Follow-up with your urologist for your episode of painless hematuria today.  Return to the emergency department for any concern for sudden abdominal pain, inability to urinate as this could be a sign of acute urinary retention from clot production in your bladder.  You may benefit from an outpatient cystoscopy with urologist.

## 2022-07-12 ENCOUNTER — Telehealth: Payer: Self-pay | Admitting: *Deleted

## 2022-07-12 NOTE — Telephone Encounter (Signed)
     Patient  visit on at 07/08/2022 was for appt with urologist   Have you been able to follow up with your primary care physician? Patient waiting on urology ct and f/U with Dr Jeffie Pollock says he has called multiple days and is upset he has not heard back from [facility] called practice to try and facilitate the scheduling for his Ct recommended from the ed visit   The patient was able to obtain any needed medicine or equipment.  Are there diet recommendations that you are having difficulty following?  Patient expresses understanding of discharge instructions and education provided has no other needs at this time  [place] [place] [address] , [place] [address] Email : [email or web address]

## 2023-01-10 IMAGING — US US EXTREM LOW VENOUS*L*
1 series · 14 of 24 positions shown · non-contrast
Comparison: None.

CLINICAL DATA: Left leg pain

EXAM:
Left LOWER EXTREMITY VENOUS DOPPLER ULTRASOUND
TECHNIQUE: Gray-scale sonography with compression, as well as color and duplex
ultrasound, were performed to evaluate the deep venous system(s)
from the level of the common femoral vein through the popliteal and
proximal calf veins.

[Series 1: us extrem low venous*left* · 14 of 55 slices shown]
[im 1/55]
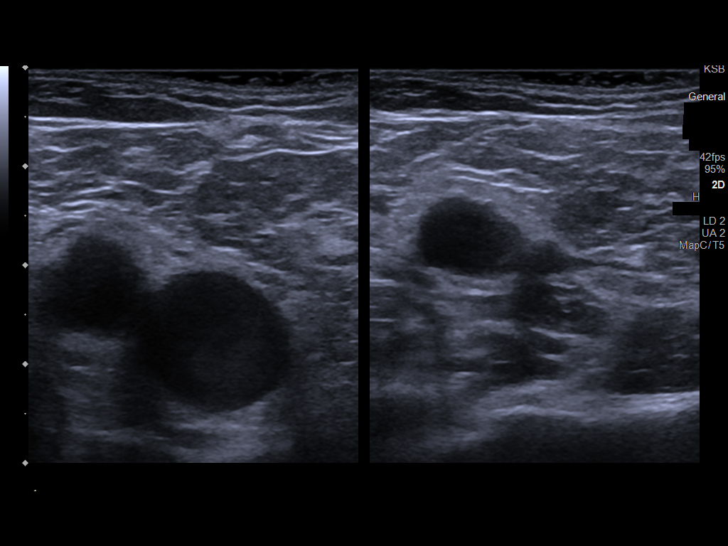
[im 5/55]
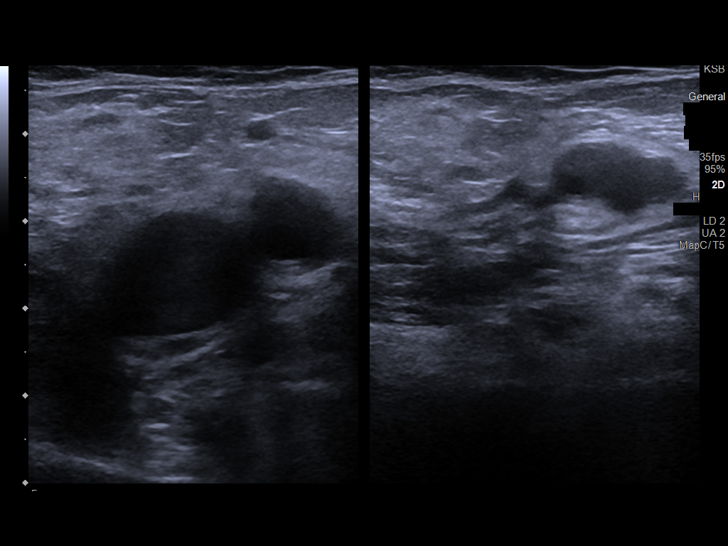
[im 10/55]
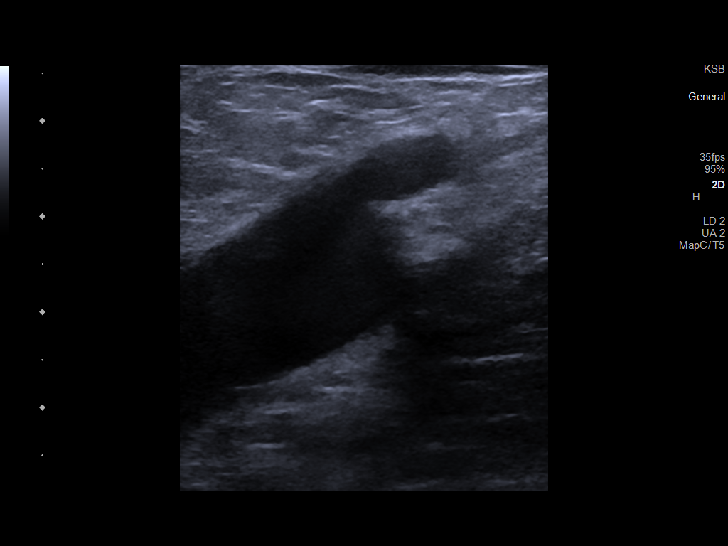
[im 15/55]
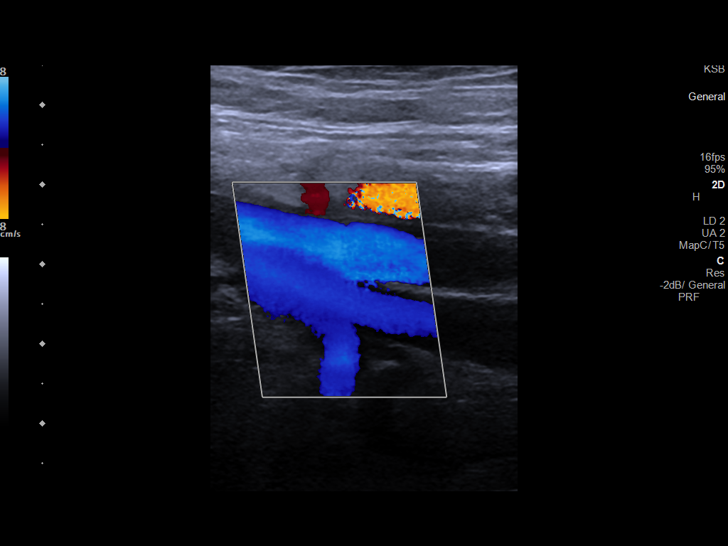
[im 17/55]
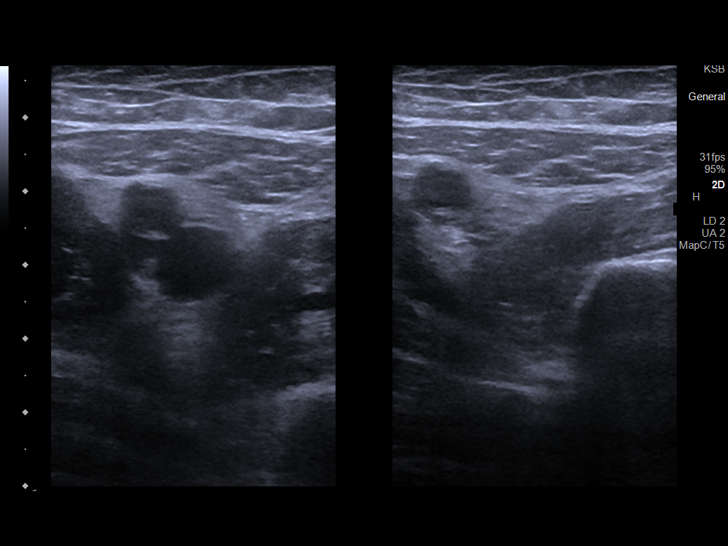
[im 22/55]
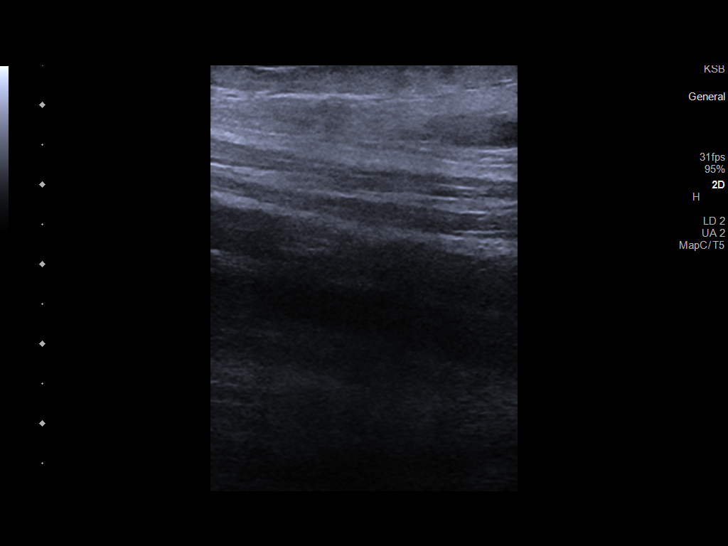
[im 26/55]
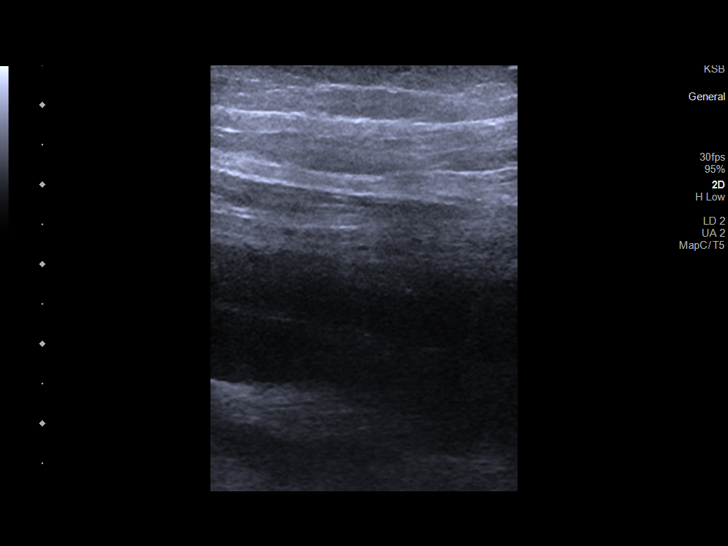
[im 29/55]
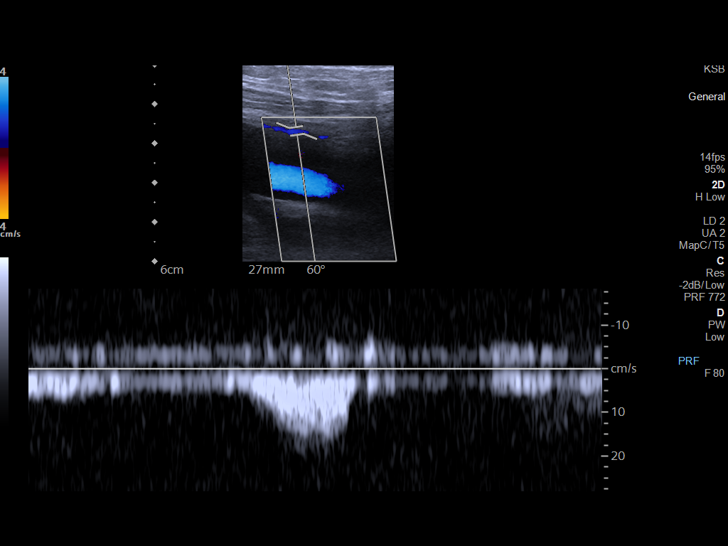
[im 33/55]
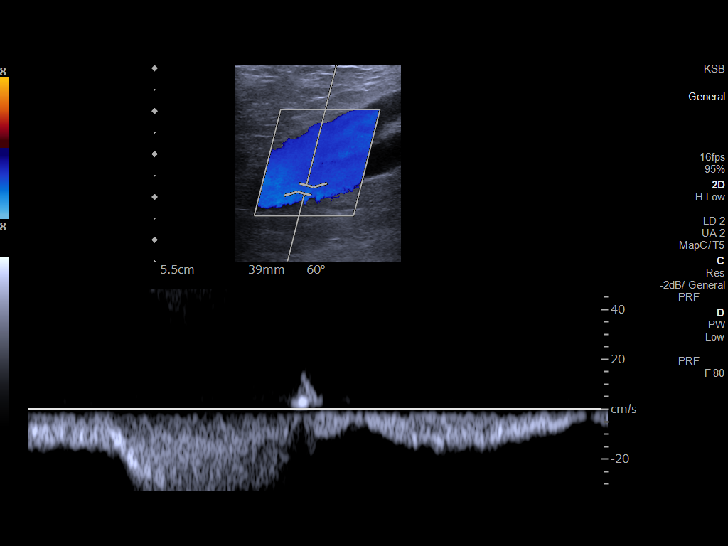
[im 38/55]
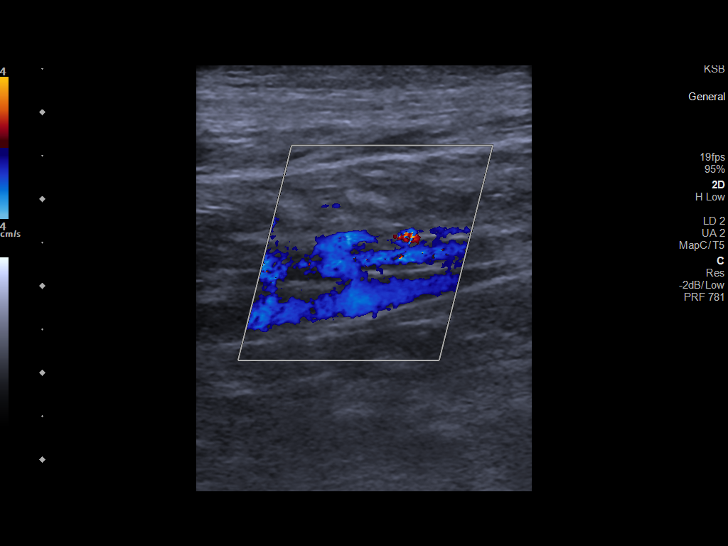
[im 43/55]
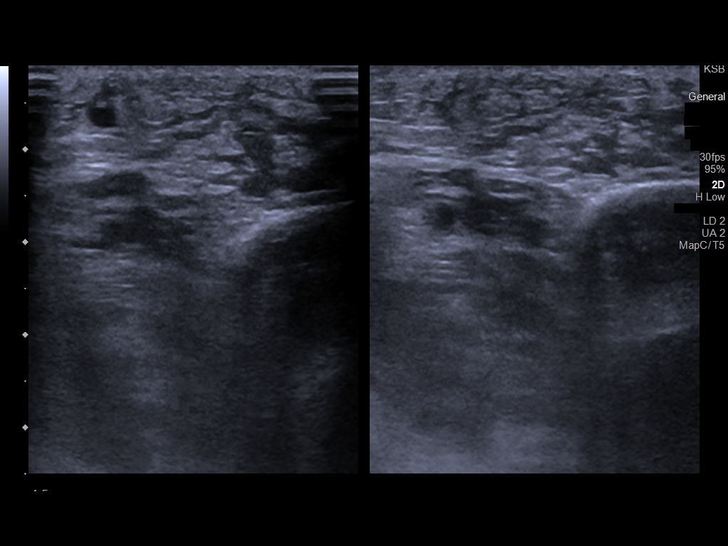
[im 45/55]
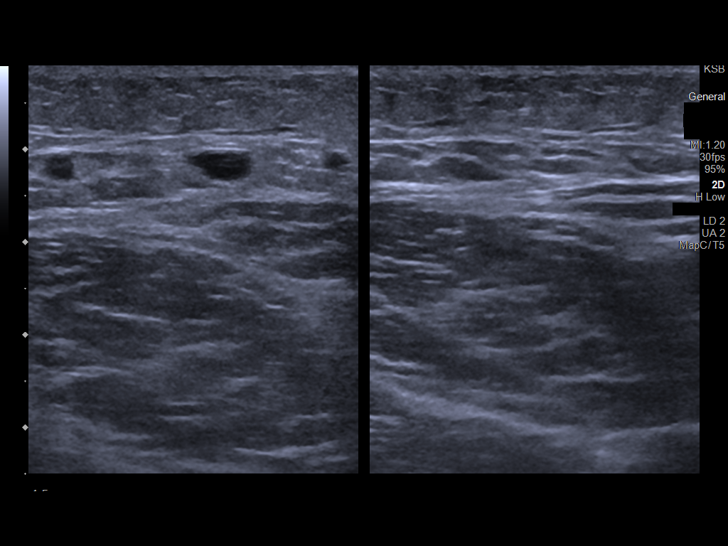
[im 50/55]
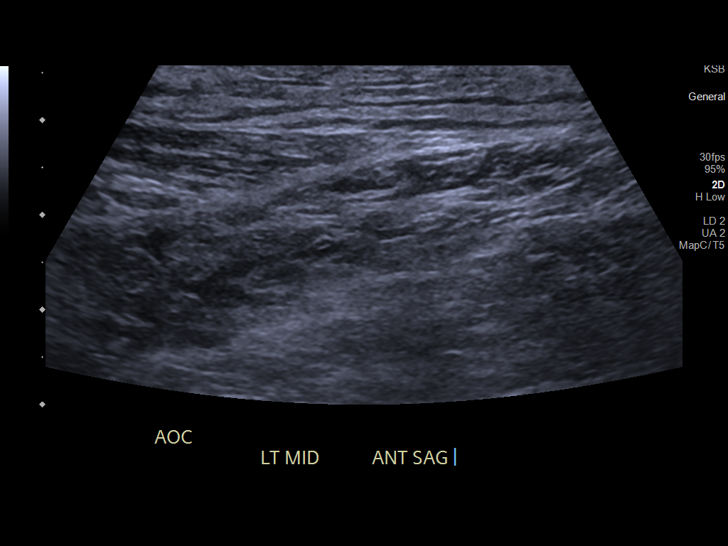
[im 55/55]
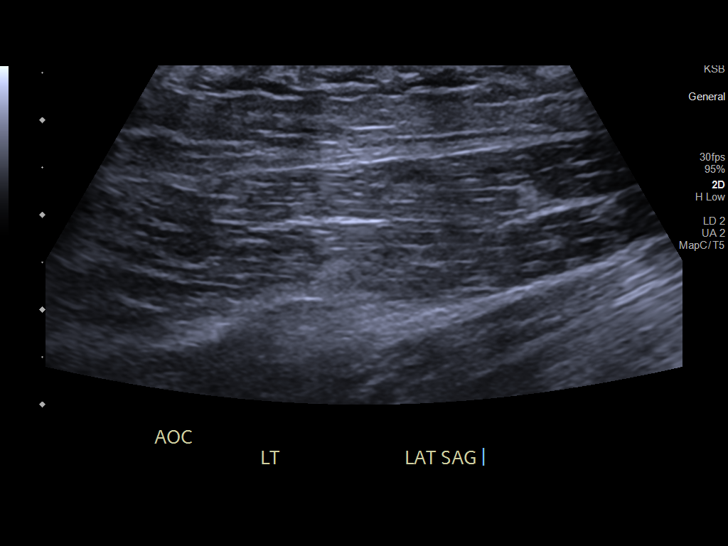

[14 of 24 positions shown; findings below may reference images not displayed]

FINDINGS: VENOUS

Normal compressibility of the common femoral, superficial femoral,
and popliteal veins, as well as the visualized calf veins.
Visualized portions of profunda femoral vein and great saphenous
vein unremarkable. No filling defects to suggest DVT on grayscale or
color Doppler imaging. Doppler waveforms show normal direction of
venous flow, normal respiratory plasticity and response to
augmentation.

Limited views of the contralateral common femoral vein are
unremarkable.

OTHER

Edema within the skin and subcutaneous soft tissues of the left
lower extremity.

Limitations: none
IMPRESSION: Negative.

## 2023-02-13 ENCOUNTER — Other Ambulatory Visit: Payer: Self-pay | Admitting: Urology

## 2023-02-16 NOTE — Patient Instructions (Signed)
SURGICAL WAITING ROOM VISITATION  Patients having surgery or a procedure may have no more than 2 support people in the waiting area - these visitors may rotate.    Children under the age of 30 must have an adult with them who is not the patient.  Due to an increase in RSV and influenza rates and associated hospitalizations, children ages 8 and under may not visit patients in [facility] hospitals.  If the patient needs to stay at the hospital during part of their recovery, the visitor guidelines for inpatient rooms apply. Pre-op nurse will coordinate an appropriate time for 1 support person to accompany patient in pre-op.  This support person may not rotate.    Please refer to the [facility] website for the visitor guidelines for Inpatients (after your surgery is over and you are in a regular room).       Your procedure is scheduled on:  02/27/2023    Report to [facility] Main Entrance    Report to admitting at  0745 AM   Call this number if you have problems the morning of surgery [phone]   Do not eat food or drink liquids  :After Midnight.                              If you have questions, please contact your surgeon's office.       Oral Hygiene is also important to reduce your risk of infection.                                    Remember - BRUSH YOUR TEETH THE MORNING OF SURGERY WITH YOUR REGULAR TOOTHPASTE  DENTURES WILL BE REMOVED PRIOR TO SURGERY PLEASE DO NOT APPLY "Poly grip" OR ADHESIVES!!!   Do NOT smoke after Midnight   Stop all vitamins and herbal supplements 7 days before surgery.   Take these medicines the morning of surgery with A SIP OF WATER:  amlodipine, protonix   DO NOT TAKE ANY ORAL DIABETIC MEDICATIONS DAY OF YOUR SURGERY  Bring CPAP mask and tubing day of surgery.                              You may not have any metal on your body including hair pins, jewelry, and body piercing             Do not wear make-up, lotions,  powders, perfumes/cologne, or deodorant  Do not wear nail polish including gel and S&S, artificial/acrylic nails, or any other type of covering on natural nails including finger and toenails. If you have artificial nails, gel coating, etc. that needs to be removed by a nail salon please have this removed prior to surgery or surgery may need to be canceled/ delayed if the surgeon/ anesthesia feels like they are unable to be safely monitored.   Do not shave  48 hours prior to surgery.               Men may shave face and neck.   Do not bring valuables to the hospital. [facility] IS NOT             RESPONSIBLE   FOR VALUABLES.   Contacts, glasses, dentures or bridgework may not be worn into surgery.   Bring small overnight bag day  of surgery.   DO NOT BRING YOUR HOME MEDICATIONS TO THE HOSPITAL. PHARMACY WILL DISPENSE MEDICATIONS LISTED ON YOUR MEDICATION LIST TO YOU DURING YOUR ADMISSION IN THE HOSPITAL!    Patients discharged on the day of surgery will not be allowed to drive home.  Someone NEEDS to stay with you for the first 24 hours after anesthesia.   Special Instructions: Bring a copy of your healthcare power of attorney and living will documents the day of surgery if you haven't scanned them before.              Please read over the following fact sheets you were given: IF YOU HAVE QUESTIONS ABOUT YOUR PRE-OP INSTRUCTIONS PLEASE CALL [phone]   If you received a COVID test during your pre-op visit  it is requested that you wear a mask when out in public, stay away from anyone that may not be feeling well and notify your surgeon if you develop symptoms. If you test positive for Covid or have been in contact with anyone that has tested positive in the last 10 days please notify you surgeon.    [facility] - Preparing for Surgery Before surgery, you can play an important role.  Because skin is not sterile, your skin needs to be as free of germs as possible.  You can reduce the  number of germs on your skin by washing with CHG (chlorahexidine gluconate) soap before surgery.  CHG is an antiseptic cleaner which kills germs and bonds with the skin to continue killing germs even after washing. Please DO NOT use if you have an allergy to CHG or antibacterial soaps.  If your skin becomes reddened/irritated stop using the CHG and inform your nurse when you arrive at Short Stay. Do not shave (including legs and underarms) for at least 48 hours prior to the first CHG shower.  You may shave your face/neck. Please follow these instructions carefully:  1.  Shower with CHG Soap the night before surgery and the  morning of Surgery.  2.  If you choose to wash your hair, wash your hair first as usual with your  normal  shampoo.  3.  After you shampoo, rinse your hair and body thoroughly to remove the  shampoo.                           4.  Use CHG as you would any other liquid soap.  You can apply chg directly  to the skin and wash                       Gently with a scrungie or clean washcloth.  5.  Apply the CHG Soap to your body ONLY FROM THE NECK DOWN.   Do not use on face/ open                           Wound or open sores. Avoid contact with eyes, ears mouth and genitals (private parts).                       Wash face,  Genitals (private parts) with your normal soap.             6.  Wash thoroughly, paying special attention to the area where your surgery  will be performed.  7.  Thoroughly rinse your body with warm water from the  neck down.  8.  DO NOT shower/wash with your normal soap after using and rinsing off  the CHG Soap.                9.  Pat yourself dry with a clean towel.            10.  Wear clean pajamas.            11.  Place clean sheets on your bed the night of your first shower and do not  sleep with pets. Day of Surgery : Do not apply any lotions/deodorants the morning of surgery.  Please wear clean clothes to the hospital/surgery center.  FAILURE TO FOLLOW  THESE INSTRUCTIONS MAY RESULT IN THE CANCELLATION OF YOUR SURGERY PATIENT SIGNATURE_________________________________  NURSE SIGNATURE__________________________________  ________________________________________________________________________

## 2023-02-16 NOTE — Progress Notes (Signed)
Anesthesia Review:  PCP: Cardiologist : Chest x-ray : EKG : Echo : Stress test: Cardiac Cath :  Activity level:  Sleep Study/ CPAP : Fasting Blood Sugar :      / Checks Blood Sugar -- times a day:   Blood Thinner/ Instructions /Last Dose: ASA / Instructions/ Last Dose :    81 mg aspirpin

## 2023-02-19 ENCOUNTER — Other Ambulatory Visit: Payer: Self-pay

## 2023-02-19 ENCOUNTER — Encounter (HOSPITAL_COMMUNITY): Payer: Self-pay

## 2023-02-19 ENCOUNTER — Encounter (HOSPITAL_COMMUNITY)
Admission: RE | Admit: 2023-02-19 | Discharge: 2023-02-19 | Disposition: A | Discharge status: Home / Self Care | Payer: Medicare Other | Source: Ambulatory Visit | Attending: Urology | Admitting: Urology

## 2023-02-19 VITALS — BP 135/89 | HR 66 | Temp 98.2°F | Resp 16 | Ht 72.0 in | Wt 185.0 lb

## 2023-02-19 DIAGNOSIS — Z01818 Encounter for other preprocedural examination: Secondary | ICD-10-CM | POA: Diagnosis present

## 2023-02-19 HISTORY — DX: Hemochromatosis, unspecified: E83.119

## 2023-02-19 LAB — CBC
HCT: 50.9 % (ref 39.0–52.0)
Hemoglobin: 17 g/dL (ref 13.0–17.0)
MCH: 32 pg (ref 26.0–34.0)
MCHC: 33.4 g/dL (ref 30.0–36.0)
MCV: 95.9 fL (ref 80.0–100.0)
Platelets: 356 10*3/uL (ref 150–400)
RBC: 5.31 MIL/uL (ref 4.22–5.81)
RDW: 12.6 % (ref 11.5–15.5)
WBC: 9.7 10*3/uL (ref 4.0–10.5)
nRBC: 0 % (ref 0.0–0.2)

## 2023-02-19 LAB — BASIC METABOLIC PANEL
Anion gap: 8 (ref 5–15)
BUN: 22 mg/dL (ref 8–23)
CO2: 26 mmol/L (ref 22–32)
Calcium: 8.9 mg/dL (ref 8.9–10.3)
Chloride: 104 mmol/L (ref 98–111)
Creatinine, Ser: 1.41 mg/dL — ABNORMAL HIGH (ref 0.61–1.24)
GFR, Estimated: 54 mL/min — ABNORMAL LOW (ref >60)
Glucose, Bld: 87 mg/dL (ref 70–99)
Potassium: 3.9 mmol/L (ref 3.5–5.1)
Sodium: 138 mmol/L (ref 135–145)

## 2023-02-26 NOTE — H&P (Signed)
I have an enlarged prostate (S/P Surgery).  HPI: Joshua Bowers is a 72 year-old male established patient who is here for an enlarged prostate after surgical intervention.  He has had a TURP for treatment of his lower urinary tract symptoms due to his BPH.   02/12/23: Joshua Bowers returns today in f/u. He had more hematuria on 7/4 and 11/24/22. He had dark urine without clots. He had cystoscopy at his last visit and that just showed some BPH regrowth. His IPSS today is 6 with nocturia x 1. He has some post void dribbling. He had a TURP in 2019 and a negative prostate biopsy in 2013. He had a bladder tumor over 10 years ago as well.   08/04/22: Joshua Bowers had gross hematuria with clots about 3 weeks ago. His urine is clear today. He had a CT hematuria study that was negative. The right distal ureter was not well opacified but there was no dilation or obvious filling defect noted. He is for cystoscopy today. He has had a prior TURP and a history of bladder cancer.   01/27/22: Joshua Bowers returns today in f/u. He had a TURP on 11/20/17. He has no hematuria. His IPSS is 4. He has nocturia x 1. He has some PVD. His last PSA in 3/22 was 2.5 which was stable.   He has ED but continues to respond to sildenafil 100mg  .   He has a history of bladder cancer with a LG tumor in 2011. He has no hematuria.     AUA Symptom Score: Less than 20% of the time he has the sensation of not emptying his bladder completely when finished urinating. Less than 20% of the time he has to urinate again fewer than two hours after he has finished urinating. Less than 20% of the time he has to start and stop again several times when he urinates. He never finds it difficult to postpone urination. Less than 20% of the time he has a weak urinary stream. Less than 20% of the time he has to push or strain to begin urination. He has to get up to urinate 1 time from the time he goes to bed until the time he gets up in the morning.   Calculated AUA Symptom  Score: 6    QOL Score: He would feel mostly satisfied if he had to live with his urinary condition the way it is now for the rest of his life.   Calculated QOL Symptom Score: 2    IPSS: Calculated IPSS: 8    ALLERGIES: No Allergies    MEDICATIONS: Hydrochlorothiazide  Amlodipine Besylate 5 mg tablet Oral  Losartan Potassium 50 mg tablet Oral  Multivitamins tablet Oral  Sildenafil Citrate 100 mg tablet take 1 tablet PO as directed  Vitamin B Complex  Zinc     GU PSH: Cystoscopy - 08/04/2022, 2022, 2019 Cystoscopy TURBT 2-5 cm - 2013 Cystoscopy TURP - 2019 Locm 300-399Mg /Ml Iodine,1Ml - 07/13/2022       PSH Notes: Cystoscopy With Fulguration Medium Lesion (2-5cm),  Arm Incision  R Knee replacement.    NON-GU PSH: Eye Surgery (Unspecified) Hernia Repair, Right, Inguinal laparoscopic Knee replacement, Left     GU PMH: BPH w/LUTS, He had transient hematuria after a work out and it was probably from the prostatic regrowth. No obvious upper tract abnormalities were noted. I will have him return in 6 months. - 08/04/2022, He is voiding with mild residual LUTS s/p TURP. PSA today and prior to f/u in 1  yr. , - 01/27/2022, He continues to void well s/p TURP. His PVR is 0ml. PSA today and in 1 year. , - 2022, He continues to void well post TURP. , - 2020, He is doing well post TURP and finally had resolution of the dysuria. His IPSS is 3. He was encouraged to stop the tamsulosin. , - 2019, The dysuria was from debris in the prostatic urethra that I dislodged and he voided out. He will keep his f/u in October. , - 2019, - 2019, Benign prostatic hyperplasia with urinary obstruction, - 2017 Gross hematuria - 08/04/2022, - 07/13/2022 History of bladder cancer, No recurrences seen. - 08/04/2022, UA is clear. , - 01/27/2022, He has no recurrent tumors. There is a small residual area consistent with catheter trauma on the posterior bladder wall. He doesn't need further cystoscopy. , - 2022, History of  malignant neoplasm of bladder, - 2016, Bladder Cancer, - 2014 ED due to arterial insufficiency, Sildenafil refilled. - 01/27/2022, Sildenafil refilled. , - 2022, Erectile dysfunction due to arterial insufficiency, - 2017 Nocturia - 01/27/2022, Nocturia, - 2017 Incomplete bladder emptying - 2022 Dysuria - 2019 Acute Cystitis/UTI, He appears to have cystitis with spasms. I have given him rocephin and will send a script for macrobid pending the culture. He will f/u in 2 months unless his symptoms recur. - 2019 Urinary Urgency (Worsening) - 2019 Elevated PSA, PSA is stable. - 2019, (Worsening), He has a slight increase in the PSA but a large benign prostate. I am going to repeat the PSA in 6 months and if it continues to rise, I will consider repeat MRI and possible biopsy. , - 2018 (Improving), His PSA is back down some and only 0.25 above his prior high. I am just going to have him return in 6 months with a PSA. , - 2018, - 2017, Elevated prostate specific antigen (PSA), - 2017 Urge incontinence (Worsening) - 2018 Unil Inguinal Hernia W/O obst or gang,non-recurrent, Right inguinal hernia - 2017 Weak Urinary Stream, Weak urinary stream - 2016 Abnormal urine findings, Cytological and Histological exam, Abnormal findings on cytological and histological examination of urine - 2015      PMH Notes:  2013-01-29 12:14:30 - Note: Furuncle  2011-09-19 16:19:56 - Note: Cystoscopy Bladder Tumor   NON-GU PMH: Other specified postprocedural states - 2019 Encounter for general adult medical examination without abnormal findings, Encounter for preventive health examination - 2016 Personal history of other diseases of the circulatory system, History of hypertension - 2014    FAMILY HISTORY: Family Health Status Number - Runs In Family Heart Disease - Runs In Family Prostate Cancer - Runs In Family   SOCIAL HISTORY: Marital Status: Divorced Preferred Language: English; Ethnicity: Not Hispanic Or Latino; Race:  White Current Smoking Status: Patient has never smoked.   Tobacco Use Assessment Completed: Used Tobacco in last 30 days? Has never drank.  Does not use drugs. Drinks 1 caffeinated drink per day.     Notes: Alcohol Use, Occupation:, Marital History - Divorced, Never A Smoker, Caffeine Use   REVIEW OF SYSTEMS:    GU Review Male:   Patient denies frequent urination, hard to postpone urination, burning/ pain with urination, get up at night to urinate, leakage of urine, stream starts and stops, trouble starting your stream, have to strain to urinate , erection problems, and penile pain.  Gastrointestinal (Upper):   Patient denies nausea, vomiting, and indigestion/ heartburn.  Gastrointestinal (Lower):   Patient denies diarrhea and constipation.  Constitutional:  Patient denies fever, night sweats, weight loss, and fatigue.  Skin:   Patient denies skin rash/ lesion and itching.  Eyes:   Patient denies blurred vision and double vision.  Ears/ Nose/ Throat:   Patient denies sore throat and sinus problems.  Hematologic/Lymphatic:   Patient denies easy bruising and swollen glands.  Cardiovascular:   Patient denies leg swelling and chest pains.  Respiratory:   Patient denies cough and shortness of breath.  Endocrine:   Patient denies excessive thirst.  Musculoskeletal:   Patient denies back pain and joint pain.  Neurological:   Patient denies headaches and dizziness.  Psychologic:   Patient denies depression and anxiety.   VITAL SIGNS: None   MULTI-SYSTEM PHYSICAL EXAMINATION:    Constitutional: Well-nourished. No physical deformities. Normally developed. Good grooming.   Respiratory: Normal breath sounds. No labored breathing, no use of accessory muscles.   Cardiovascular: Regular rate and rhythm. No murmur, no gallop.      Complexity of Data:  Records Review:   AUA Symptom Score, Previous Patient Records  Urine Test Review:   Urinalysis   01/27/22 08/13/20 09/23/18 04/01/18 09/07/17  03/07/17 08/25/16 05/01/16  PSA  Total PSA 2.75 ng/mL 2.50 ng/mL 2.39 ng/mL 1.64 ng/mL 6.4 ng/dl 6.6 ng/dl 6.0 ng/dl 6.4   Free PSA        1.1   % Free PSA        17     PROCEDURES:          Visit Complexity - G2211 Chronic management         Urinalysis Dipstick Dipstick Cont'd  Color: Yellow Bilirubin: Neg mg/dL  Appearance: Clear Ketones: Neg mg/dL  Specific Gravity: 9.604 Blood: Neg ery/uL  pH: 5.5 Protein: Neg mg/dL  Glucose: Neg mg/dL Urobilinogen: 0.2 mg/dL    Nitrites: Neg    Leukocyte Esterase: Neg leu/uL    ASSESSMENT:      ICD-10 Details  1 GU:   BPH w/LUTS - N40.1 Chronic, Worsening - He has some increased LUTS but they are still mild but he had additional hematuria. I discussed the options of continued observation, finasteride or re- TURP since there was friable regrowth on his prior cystoscopy. He is most interested in re-resection.   I reviewd the risks of a TURP including bleeding, infection, incontinence, stricture, need for secondary procedures, ejaculatory and erectile dysfunction, thrombotic events, fluid overload and anesthetic complications. I explained that 95% of men will have relief of the obstructive symptoms and about 70% will have relief of the irritative symptoms.    2   Gross hematuria - R31.0 Acute, Uncomplicated, Resolved  3   Nocturia - R35.1 Minor  4   Elevated PSA - R97.20 Chronic, Worsening - His PSA was up further to 2.75 prior to his last visit. I will get another today and if there is a further increase will consider a biopsy a the time of the TURP.   5   History of bladder cancer - Z85.51 Minor - He has had no recurrences, but the right distal ureter didn't fully opacify on his CT earlier this year so I will do retrograde pyelograms at the time of the TURP.    PLAN:           Orders Labs PSA with Reflex          Schedule Return Visit/Planned Activity: Next Available Appointment - Schedule Surgery  Procedure: Unspecified Date -  Cystoscopy TURP - 54098 Notes: Next available.

## 2023-02-27 ENCOUNTER — Other Ambulatory Visit: Payer: Self-pay

## 2023-02-27 ENCOUNTER — Ambulatory Visit (HOSPITAL_BASED_OUTPATIENT_CLINIC_OR_DEPARTMENT_OTHER): Payer: Medicare Other | Admitting: Anesthesiology

## 2023-02-27 ENCOUNTER — Encounter (HOSPITAL_COMMUNITY)
Admission: RE | Disposition: A | Discharge status: Home / Self Care | Payer: Self-pay | Source: Ambulatory Visit | Attending: Urology

## 2023-02-27 ENCOUNTER — Ambulatory Visit (HOSPITAL_COMMUNITY): Payer: Medicare Other

## 2023-02-27 ENCOUNTER — Ambulatory Visit (HOSPITAL_COMMUNITY): Payer: Medicare Other | Admitting: Physician Assistant

## 2023-02-27 ENCOUNTER — Encounter (HOSPITAL_COMMUNITY): Payer: Self-pay | Admitting: Urology

## 2023-02-27 ENCOUNTER — Observation Stay (HOSPITAL_COMMUNITY)
Admission: RE | Admit: 2023-02-27 | Discharge: 2023-02-28 | Disposition: A | Discharge status: Home / Self Care | Payer: Medicare Other | Source: Ambulatory Visit | Attending: Urology | Admitting: Urology

## 2023-02-27 DIAGNOSIS — Z79899 Other long term (current) drug therapy: Secondary | ICD-10-CM | POA: Diagnosis not present

## 2023-02-27 DIAGNOSIS — N138 Other obstructive and reflux uropathy: Principal | ICD-10-CM | POA: Diagnosis present

## 2023-02-27 DIAGNOSIS — I10 Essential (primary) hypertension: Secondary | ICD-10-CM | POA: Diagnosis not present

## 2023-02-27 DIAGNOSIS — R972 Elevated prostate specific antigen [PSA]: Secondary | ICD-10-CM | POA: Insufficient documentation

## 2023-02-27 DIAGNOSIS — N401 Enlarged prostate with lower urinary tract symptoms: Principal | ICD-10-CM | POA: Insufficient documentation

## 2023-02-27 DIAGNOSIS — Z96653 Presence of artificial knee joint, bilateral: Secondary | ICD-10-CM | POA: Diagnosis not present

## 2023-02-27 DIAGNOSIS — Z01818 Encounter for other preprocedural examination: Secondary | ICD-10-CM

## 2023-02-27 DIAGNOSIS — Z8551 Personal history of malignant neoplasm of bladder: Secondary | ICD-10-CM | POA: Insufficient documentation

## 2023-02-27 DIAGNOSIS — N320 Bladder-neck obstruction: Secondary | ICD-10-CM | POA: Insufficient documentation

## 2023-02-27 DIAGNOSIS — R351 Nocturia: Secondary | ICD-10-CM | POA: Diagnosis not present

## 2023-02-27 DIAGNOSIS — R310 Gross hematuria: Secondary | ICD-10-CM | POA: Insufficient documentation

## 2023-02-27 HISTORY — PX: TRANSURETHRAL RESECTION OF PROSTATE: SHX73

## 2023-02-27 HISTORY — PX: CYSTOSCOPY W/ RETROGRADES: SHX1426

## 2023-02-27 LAB — HIV ANTIBODY (ROUTINE TESTING W REFLEX): HIV Screen 4th Generation wRfx: NONREACTIVE

## 2023-02-27 SURGERY — TURP (TRANSURETHRAL RESECTION OF PROSTATE)
Anesthesia: General

## 2023-02-27 MED ORDER — SENNOSIDES-DOCUSATE SODIUM 8.6-50 MG PO TABS: 1.0000 | ORAL_TABLET | Freq: Every evening | ORAL | Status: DC | PRN

## 2023-02-27 MED ORDER — ONDANSETRON HCL 4 MG/2ML IJ SOLN
INTRAMUSCULAR | Status: DC | PRN
  Administered 2023-02-27: 4 mg via INTRAVENOUS

## 2023-02-27 MED ORDER — ACETAMINOPHEN 325 MG PO TABS: 650.0000 mg | ORAL_TABLET | ORAL | Status: DC | PRN

## 2023-02-27 MED ORDER — IOHEXOL 300 MG/ML  SOLN
INTRAMUSCULAR | Status: DC | PRN
Start: 2023-02-27 — End: 2023-02-27
  Administered 2023-02-27: 9 mL

## 2023-02-27 MED ORDER — HYOSCYAMINE SULFATE 0.125 MG SL SUBL
0.1250 mg | SUBLINGUAL_TABLET | SUBLINGUAL | Status: DC | PRN
  Administered 2023-02-27 – 2023-02-28 (×3): 0.125 mg via SUBLINGUAL
  Filled 2023-02-27 (×2): qty 1

## 2023-02-27 MED ORDER — ONDANSETRON HCL 4 MG/2ML IJ SOLN
INTRAMUSCULAR | Status: AC
  Filled 2023-02-27: qty 2

## 2023-02-27 MED ORDER — CEFAZOLIN SODIUM-DEXTROSE 2-4 GM/100ML-% IV SOLN
2.0000 g | INTRAVENOUS | Status: AC
  Administered 2023-02-27: 2 g via INTRAVENOUS
  Filled 2023-02-27: qty 100

## 2023-02-27 MED ORDER — SODIUM CHLORIDE 0.9 % IR SOLN
Status: DC | PRN
  Administered 2023-02-27 (×2): 3000 mL via INTRAVESICAL

## 2023-02-27 MED ORDER — HYDROCHLOROTHIAZIDE 25 MG PO TABS
25.0000 mg | ORAL_TABLET | Freq: Every day | ORAL | Status: DC
  Administered 2023-02-27 – 2023-02-28 (×2): 25 mg via ORAL
  Filled 2023-02-27 (×2): qty 1

## 2023-02-27 MED ORDER — ONDANSETRON HCL 4 MG/2ML IJ SOLN: 4.0000 mg | INTRAMUSCULAR | Status: DC | PRN

## 2023-02-27 MED ORDER — OXYCODONE HCL 5 MG/5ML PO SOLN: 5.0000 mg | Freq: Once | ORAL | Status: DC | PRN

## 2023-02-27 MED ORDER — LACTATED RINGERS IV SOLN: INTRAVENOUS | Status: DC | PRN

## 2023-02-27 MED ORDER — CHLORHEXIDINE GLUCONATE 0.12 % MT SOLN
15.0000 mL | Freq: Once | OROMUCOSAL | Status: AC
  Administered 2023-02-27: 15 mL via OROMUCOSAL

## 2023-02-27 MED ORDER — BISACODYL 10 MG RE SUPP: 10.0000 mg | Freq: Every day | RECTAL | Status: DC | PRN

## 2023-02-27 MED ORDER — MIDAZOLAM HCL 5 MG/5ML IJ SOLN
INTRAMUSCULAR | Status: DC | PRN
  Administered 2023-02-27: 2 mg via INTRAVENOUS

## 2023-02-27 MED ORDER — LOSARTAN POTASSIUM-HCTZ 100-25 MG PO TABS: 1.0000 | ORAL_TABLET | Freq: Every day | ORAL | Status: DC

## 2023-02-27 MED ORDER — POTASSIUM CHLORIDE IN NACL 20-0.45 MEQ/L-% IV SOLN
INTRAVENOUS | Status: DC
  Filled 2023-02-27: qty 1000

## 2023-02-27 MED ORDER — FENTANYL CITRATE PF 50 MCG/ML IJ SOSY
25.0000 ug | PREFILLED_SYRINGE | INTRAMUSCULAR | Status: DC | PRN
  Administered 2023-02-27 (×2): 50 ug via INTRAVENOUS

## 2023-02-27 MED ORDER — DEXAMETHASONE SODIUM PHOSPHATE 4 MG/ML IJ SOLN
INTRAMUSCULAR | Status: DC | PRN
Start: 2023-02-27 — End: 2023-02-27
  Administered 2023-02-27: 5 mg via INTRAVENOUS

## 2023-02-27 MED ORDER — FLEET ENEMA RE ENEM: 1.0000 | ENEMA | Freq: Once | RECTAL | Status: DC | PRN

## 2023-02-27 MED ORDER — AMLODIPINE BESYLATE 5 MG PO TABS
5.0000 mg | ORAL_TABLET | Freq: Every day | ORAL | Status: DC
  Administered 2023-02-27 – 2023-02-28 (×2): 5 mg via ORAL
  Filled 2023-02-27 (×2): qty 1

## 2023-02-27 MED ORDER — ACETAMINOPHEN 500 MG PO TABS
1000.0000 mg | ORAL_TABLET | Freq: Once | ORAL | Status: AC
  Administered 2023-02-27: 1000 mg via ORAL
  Filled 2023-02-27: qty 2

## 2023-02-27 MED ORDER — OXYCODONE HCL 5 MG PO TABS
5.0000 mg | ORAL_TABLET | ORAL | Status: DC | PRN
  Administered 2023-02-27 – 2023-02-28 (×3): 5 mg via ORAL
  Filled 2023-02-27 (×3): qty 1

## 2023-02-27 MED ORDER — FENTANYL CITRATE (PF) 100 MCG/2ML IJ SOLN
INTRAMUSCULAR | Status: DC | PRN
  Administered 2023-02-27 (×4): 25 ug via INTRAVENOUS

## 2023-02-27 MED ORDER — LIDOCAINE HCL (CARDIAC) PF 100 MG/5ML IV SOSY
PREFILLED_SYRINGE | INTRAVENOUS | Status: DC | PRN
  Administered 2023-02-27: 80 mg via INTRAVENOUS

## 2023-02-27 MED ORDER — FENTANYL CITRATE PF 50 MCG/ML IJ SOSY
PREFILLED_SYRINGE | INTRAMUSCULAR | Status: AC
  Filled 2023-02-27: qty 2

## 2023-02-27 MED ORDER — OXYCODONE HCL 5 MG PO TABS: 5.0000 mg | ORAL_TABLET | Freq: Once | ORAL | Status: DC | PRN

## 2023-02-27 MED ORDER — ZOLPIDEM TARTRATE 5 MG PO TABS
5.0000 mg | ORAL_TABLET | Freq: Every evening | ORAL | Status: DC | PRN
  Administered 2023-02-27: 5 mg via ORAL
  Filled 2023-02-27: qty 1

## 2023-02-27 MED ORDER — TIZANIDINE HCL 4 MG PO TABS
4.0000 mg | ORAL_TABLET | Freq: Three times a day (TID) | ORAL | Status: DC | PRN
  Administered 2023-02-27: 4 mg via ORAL
  Filled 2023-02-27: qty 1

## 2023-02-27 MED ORDER — PROPOFOL 10 MG/ML IV BOLUS
INTRAVENOUS | Status: DC | PRN
  Administered 2023-02-27: 150 mg via INTRAVENOUS

## 2023-02-27 MED ORDER — LACTATED RINGERS IV SOLN: INTRAVENOUS | Status: DC

## 2023-02-27 MED ORDER — SODIUM CHLORIDE 0.9% FLUSH
10.0000 mL | Freq: Two times a day (BID) | INTRAVENOUS | Status: DC
  Administered 2023-02-27: 10 mL via INTRAVENOUS

## 2023-02-27 MED ORDER — HYDROMORPHONE HCL 1 MG/ML IJ SOLN
INTRAMUSCULAR | Status: AC
  Filled 2023-02-27: qty 1

## 2023-02-27 MED ORDER — MIDAZOLAM HCL 2 MG/2ML IJ SOLN
INTRAMUSCULAR | Status: AC
  Filled 2023-02-27: qty 2

## 2023-02-27 MED ORDER — PROPOFOL 10 MG/ML IV BOLUS
INTRAVENOUS | Status: AC
  Filled 2023-02-27: qty 20

## 2023-02-27 MED ORDER — HYOSCYAMINE SULFATE 0.125 MG SL SUBL
SUBLINGUAL_TABLET | SUBLINGUAL | Status: AC
  Filled 2023-02-27: qty 1

## 2023-02-27 MED ORDER — ONDANSETRON HCL 4 MG/2ML IJ SOLN: 4.0000 mg | Freq: Once | INTRAMUSCULAR | Status: DC | PRN

## 2023-02-27 MED ORDER — LOSARTAN POTASSIUM 50 MG PO TABS
100.0000 mg | ORAL_TABLET | Freq: Every day | ORAL | Status: DC
  Administered 2023-02-27 – 2023-02-28 (×2): 100 mg via ORAL
  Filled 2023-02-27 (×2): qty 2

## 2023-02-27 MED ORDER — FENTANYL CITRATE (PF) 100 MCG/2ML IJ SOLN
INTRAMUSCULAR | Status: AC
  Filled 2023-02-27: qty 2

## 2023-02-27 MED ORDER — ORAL CARE MOUTH RINSE: 15.0000 mL | Freq: Once | OROMUCOSAL | Status: AC

## 2023-02-27 MED ORDER — HYDROMORPHONE HCL 1 MG/ML IJ SOLN
0.5000 mg | INTRAMUSCULAR | Status: DC | PRN
  Administered 2023-02-27: 1 mg via INTRAVENOUS
  Administered 2023-02-27: 0.5 mg via INTRAVENOUS
  Filled 2023-02-27: qty 1

## 2023-02-27 SURGICAL SUPPLY — 24 items
BAG DRN RND TRDRP ANRFLXCHMBR (UROLOGICAL SUPPLIES)
BAG URINE DRAIN 2000ML AR STRL (UROLOGICAL SUPPLIES) IMPLANT
BAG URO CATCHER STRL LF (MISCELLANEOUS) ×3 IMPLANT
CATH FOLEY 3WAY 30CC 22FR (CATHETERS) IMPLANT
CATH URETL OPEN 5X70 (CATHETERS) IMPLANT
CLOTH BEACON ORANGE TIMEOUT ST (SAFETY) ×3 IMPLANT
DRAPE FOOT SWITCH (DRAPES) ×3 IMPLANT
ELECT HOOK LOOP BIPOLAR (NEEDLE) IMPLANT
ELECT REM PT RETURN 15FT ADLT (MISCELLANEOUS) ×3 IMPLANT
GLOVE SURG SS PI 8.0 STRL IVOR (GLOVE) IMPLANT
GOWN STRL REUS W/ TWL XL LVL3 (GOWN DISPOSABLE) ×3 IMPLANT
GOWN STRL REUS W/TWL XL LVL3 (GOWN DISPOSABLE) ×2
GUIDEWIRE STR DUAL SENSOR (WIRE) ×3 IMPLANT
HOLDER FOLEY CATH W/STRAP (MISCELLANEOUS) IMPLANT
KIT TURNOVER KIT A (KITS) IMPLANT
LOOP CUT BIPOLAR 24F LRG (ELECTROSURGICAL) IMPLANT
MANIFOLD NEPTUNE II (INSTRUMENTS) ×3 IMPLANT
PACK CYSTO (CUSTOM PROCEDURE TRAY) ×3 IMPLANT
PAD PREP 24X48 CUFFED NSTRL (MISCELLANEOUS) ×3 IMPLANT
SYR 30ML LL (SYRINGE) IMPLANT
SYR TOOMEY IRRIG 70ML (MISCELLANEOUS)
SYRINGE TOOMEY IRRIG 70ML (MISCELLANEOUS) IMPLANT
TUBING CONNECTING 10 (TUBING) ×3 IMPLANT
TUBING UROLOGY SET (TUBING) ×3 IMPLANT

## 2023-02-27 NOTE — Progress Notes (Signed)
Urine clear on no CBI.  .BP (!) 144/88 (BP Location: Right Arm)   Pulse (!) 56   Temp 97.6 F (36.4 C) (Oral)   Resp 18   Ht 6' (1.829 m)   Wt 83.9 kg   SpO2 97%   BMI 25.09 kg/m   Plan for voiding trial in AM.

## 2023-02-27 NOTE — Interval H&P Note (Signed)
History and Physical Interval Note:  He has had no further hematuria.   02/27/2023 9:54 AM  Joshua Bowers  has presented today for surgery, with the diagnosis of BENIGN PROSTATE HYPERPLASIA WITH INCREASE PROSTATE SPECIFIC ANTIGEN.  The various methods of treatment have been discussed with the patient and family. After consideration of risks, benefits and other options for treatment, the patient has consented to  Procedure(s) with comments: TRANSURETHRAL RESECTION OF THE PROSTATE (TURP) (N/A) CYSTOSCOPY WITH BILATERAL RETROGRADE (Bilateral) - 75 MINS FOR CASE as a surgical intervention.  The patient's history has been reviewed, patient examined, no change in status, stable for surgery.  I have reviewed the patient's chart and labs.  Questions were answered to the patient's satisfaction.     Bjorn Pippin

## 2023-02-27 NOTE — Anesthesia Procedure Notes (Signed)
Procedure Name: LMA Insertion Date/Time: 02/27/2023 10:25 AM  Performed by: Deri Fuelling, CRNAPre-anesthesia Checklist: Patient identified, Emergency Drugs available, Suction available and Patient being monitored Patient Re-evaluated:Patient Re-evaluated prior to induction Oxygen Delivery Method: Circle system utilized Preoxygenation: Pre-oxygenation with 100% oxygen Induction Type: IV induction Ventilation: Mask ventilation without difficulty LMA: LMA inserted LMA Size: 4.0 Tube type: Oral Number of attempts: 1 Airway Equipment and Method: Stylet and Oral airway Placement Confirmation: ETT inserted through vocal cords under direct vision, positive ETCO2 and breath sounds checked- equal and bilateral Tube secured with: Tape Dental Injury: Teeth and Oropharynx as per pre-operative assessment

## 2023-02-27 NOTE — Anesthesia Postprocedure Evaluation (Signed)
Anesthesia Post Note  Patient: Joshua Bowers  Procedure(s) Performed: TRANSURETHRAL RESECTION OF THE PROSTATE (TURP) CYSTOSCOPY WITH BILATERAL RETROGRADE (Bilateral)     Patient location during evaluation: PACU Anesthesia Type: General Level of consciousness: awake and alert Pain management: pain level controlled Vital Signs Assessment: post-procedure vital signs reviewed and stable Respiratory status: spontaneous breathing, nonlabored ventilation and respiratory function stable Cardiovascular status: stable and blood pressure returned to baseline Anesthetic complications: no   No notable events documented.  Last Vitals:  Vitals:   02/27/23 1130 02/27/23 1145  BP: (!) 140/97 (!) 142/91  Pulse: (!) 56 64  Resp: 12 14  Temp:  (!) 36.4 C  SpO2: 94% 95%    Last Pain:  Vitals:   02/27/23 1145  TempSrc:   PainSc: 2                  Beryle Lathe

## 2023-02-27 NOTE — Discharge Instructions (Signed)
You may resume the aspirin in 3 days.

## 2023-02-27 NOTE — Transfer of Care (Signed)
Immediate Anesthesia Transfer of Care Note  Patient: KAYMEN ADRIAN  Procedure(s) Performed: Procedure(s) with comments: TRANSURETHRAL RESECTION OF THE PROSTATE (TURP) (N/A) CYSTOSCOPY WITH BILATERAL RETROGRADE (Bilateral) - 75 MINS FOR CASE  Patient Location: PACU  Anesthesia Type:General  Level of Consciousness: Patient easily awoken, sedated, comfortable, cooperative, following commands, responds to stimulation.   Airway & Oxygen Therapy: Patient spontaneously breathing, ventilating well, oxygen via simple oxygen mask.  Post-op Assessment: Report given to PACU RN, vital signs reviewed and stable, moving all extremities.   Post vital signs: Reviewed and stable.  Complications: No apparent anesthesia complications Last Vitals:  Vitals Value Taken Time  BP 155/118 02/27/23 1058  Temp    Pulse 68 02/27/23 1059  Resp 12 02/27/23 1059  SpO2 99 % 02/27/23 1059  Vitals shown include unfiled device data.  Last Pain:  Vitals:   02/27/23 0823  TempSrc: Oral  PainSc:          Complications: No notable events documented.

## 2023-02-27 NOTE — Anesthesia Preprocedure Evaluation (Addendum)
Anesthesia Evaluation  Patient identified by MRN, date of birth, ID band Patient awake    Reviewed: Allergy & Precautions, NPO status , Patient's Chart, lab work & pertinent test results  History of Anesthesia Complications Negative for: history of anesthetic complications  Airway Mallampati: II  TM Distance: >3 FB Neck ROM: Full    Dental  (+) Dental Advisory Given, Teeth Intact   Pulmonary neg pulmonary ROS   Pulmonary exam normal        Cardiovascular hypertension, Pt. on medications Normal cardiovascular exam(-) dysrhythmias      Neuro/Psych negative neurological ROS  negative psych ROS   GI/Hepatic Neg liver ROS,GERD  Medicated and Controlled,,  Endo/Other  negative endocrine ROS    Renal/GU Renal InsufficiencyRenal disease    Bladder cancer     Musculoskeletal  (+) Arthritis ,    Abdominal   Peds  Hematology  Hemochromatosis    Anesthesia Other Findings   Reproductive/Obstetrics                             Anesthesia Physical Anesthesia Plan  ASA: 2  Anesthesia Plan: General   Post-op Pain Management: Tylenol PO (pre-op)*   Induction: Intravenous  PONV Risk Score and Plan: 2 and Treatment may vary due to age or medical condition, Ondansetron and Dexamethasone  Airway Management Planned: LMA  Additional Equipment: None  Intra-op Plan:   Post-operative Plan: Extubation in OR  Informed Consent: I have reviewed the patients History and Physical, chart, labs and discussed the procedure including the risks, benefits and alternatives for the proposed anesthesia with the patient or authorized representative who has indicated his/her understanding and acceptance.     Dental advisory given  Plan Discussed with: CRNA and Anesthesiologist  Anesthesia Plan Comments:         Anesthesia Quick Evaluation

## 2023-02-27 NOTE — Op Note (Signed)
Procedure: 1.  Cystoscopy with bilateral retrograde pyelograms and interpretation. 2.  Redo transurethral resection of the prostate. 3.  Application of fluoroscopy.  Preop diagnosis: History of gross hematuria with recurrent BPH bladder outlet obstruction.  Postop diagnosis: Same.  Findings: 1.  Normal bilateral retrograde pyelograms. 2.  Recurrent BPH.  Surgeon: Dr. Bjorn Pippin.  Anesthesia: General.  Specimen: Prostate chips.  Drains: 22 French three-way Foley catheter.  EBL: None.  Complications: None.  Indications: Joshua Bowers is a 72 year old male with a history of BPH with bladder outlet obstruction with a prior TURP in 2019 who is had recent recurrent gross hematuria and was found to have BPH regrowth that was felt to be responsible on cystoscopy.  A CT scan demonstrated no significant abnormalities back in March but there was not complete opacification of the right distal ureter so was felt the retrograde pyelography was indicated in the face of the recurrent bleeding.  He is also to undergo redo TURP for the BPH regrowth.  His PSA had been elevated but fell back to the normal range so I did not feel prostate biopsy was indicated  Procedure: He was taken operating room was given antibiotics.  A general anesthetic was induced.  He is placed in lithotomy position and fitted with PAS hose.  His perineum and genitalia were prepped Betadine solution was draped in usual sterile fashion.  Cystoscopy was performed using a 21 [place] scope and 30 degree lens.  Examination revealed a normal urethra with a very mild membranous stricture that did not impede passage of the scope.  The external sphincter was intact.  The prostatic urethra generally well resected but there was regrowth at the right apex and anterior prostate with some visual obstruction.  Examination of bladder demonstrated mild trabeculation without tumors, stones or inflammation.  Ureteral orifices were normal.  Bilateral  retrograde pyelography was performed with a 5 [place] open-ended catheter and Omnipaque.  The right retrograde pyelogram revealed a normal ureter and intrarenal collecting system without filling defects.  The left retrograde pyelogram revealed a normal ureter and intrarenal collecting system without filling defects.  The urethra was then calibrated to 73 [place] with male sounds and the 30 [place] continuous-flow resectoscope sheath was placed to the aid of a visual obturator.  The obturator was replaced with an Wandra Scot handle with a bipolar loop and the 30 degree lens.  Saline was used the irrigant.  I did note a little bleeding from the anterior prostatic urethra after the initial manipulation.  I then resected the recurrent BPH primarily from the right lateral wall and apex and the anterior wall extending to the left anterior lateral prostate.  Once an adequate resection had been performed, the bladder was evacuated free of chips and hemostasis was secured.  Final inspection revealed no retained tissue, no active bleeding and an intact external sphincter and ureteral orifices.  The scope was removed and pressure on the bladder produced a good stream.  A 22 French three-way Foley catheter was placed with the aid of a catheter guide.  The balloon was filled with 30 mL of sterile fluid.  The catheter was irrigated with clear return.  The irrigation port was plugged and the cath was placed to straight drainage.  He was taken down from lithotomy position, his anesthetic was reversed and he was moved to recovery in stable condition.  There were no complications.

## 2023-02-28 ENCOUNTER — Encounter (HOSPITAL_COMMUNITY): Payer: Self-pay | Admitting: Urology

## 2023-02-28 DIAGNOSIS — N401 Enlarged prostate with lower urinary tract symptoms: Secondary | ICD-10-CM | POA: Diagnosis not present

## 2023-02-28 DIAGNOSIS — R310 Gross hematuria: Secondary | ICD-10-CM | POA: Diagnosis present

## 2023-02-28 LAB — SURGICAL PATHOLOGY

## 2023-02-28 MED ORDER — OXYCODONE-ACETAMINOPHEN 5-325 MG PO TABS
1.0000 | ORAL_TABLET | Freq: Four times a day (QID) | ORAL | 0 refills | Status: AC | PRN
Start: 2023-02-28 — End: 2024-02-28

## 2023-02-28 MED ORDER — CHLORHEXIDINE GLUCONATE CLOTH 2 % EX PADS: 6.0000 | MEDICATED_PAD | Freq: Every day | CUTANEOUS | Status: DC

## 2023-02-28 MED ORDER — HYOSCYAMINE SULFATE 0.125 MG SL SUBL: 0.1250 mg | SUBLINGUAL_TABLET | Freq: Four times a day (QID) | SUBLINGUAL | 1 refills | Status: DC | PRN

## 2023-02-28 NOTE — TOC CM/SW Note (Signed)
Transition of Care [facility]) - Inpatient Brief Assessment   Patient Details  Name: Joshua Bowers MRN: [id number] Date of Birth: 06/29/1950  Transition of Care [facility]) CM/SW Contact:    Howell Rucks, RN Phone Number: 02/28/2023, 9:17 AM   Clinical Narrative: Met with pt at bedside to introduce role of TOC/NCM and review for dc planning. Pt reports he has an established PCP and phamracy, no current home care services or home DME, reports he still works, pt reports he feels safe returning home with support from spouse, spouse will provide transportation at discharge. MOON completed. TOC Brief Assessment completed. No TOC needs identified at this time.     Transition of Care Asessment: Insurance and Status: Insurance coverage has been reviewed Patient has primary care physician: Yes Home environment has been reviewed: resides in private residence with spouse Prior level of function:: Independent Prior/Current Home Services: No current home services Social Determinants of Health Reivew: SDOH reviewed no interventions necessary Readmission risk has been reviewed: Yes Transition of care needs: no transition of care needs at this time

## 2023-02-28 NOTE — Care Management Obs Status (Signed)
MEDICARE OBSERVATION STATUS NOTIFICATION   Patient Details  Name: Joshua Bowers MRN: [id number] Date of Birth: 07-09-1950   Medicare Observation Status Notification Given:       Howell Rucks, RN 02/28/2023, 9:14 AM

## 2023-02-28 NOTE — Discharge Summary (Signed)
Physician Discharge Summary  Patient ID: Joshua Bowers MRN: [id number] DOB/AGE: November 25, 1950 72 y.o.  Admit date: 02/27/2023 Discharge date: 02/28/2023  Admission Diagnoses:  BPH with urinary obstruction  Discharge Diagnoses:  Principal Problem:   BPH with urinary obstruction Active Problems:   BPH with obstruction/lower urinary tract symptoms   Gross hematuria   Past Medical History:  Diagnosis Date   Arthritis    hands   Bladder outlet obstruction    BPH (benign prostatic hyperplasia)    Elevated PSA    per pt 2013  prostate bx negative for cancer   Exposure to asbestos    per pt followed by a law firm in [place],  told last CT 2018 okay   GERD (gastroesophageal reflux disease)    Hemochromatosis    History of bladder cancer    per pt 2011-- s/p  TURBT superficial per pt   Hypertension    Incomplete right bundle branch block    Lower urinary tract symptoms (LUTS)     Surgeries: Procedure(s): TRANSURETHRAL RESECTION OF THE PROSTATE (TURP) CYSTOSCOPY WITH BILATERAL RETROGRADE on 02/27/2023   Consultants (if any):   Discharged Condition: Improved  Hospital Course: Joshua Bowers is an 72 y.o. male who was admitted 02/27/2023 with a diagnosis of BPH with urinary obstruction and went to the operating room on 02/27/2023 and underwent the above named procedures.  Foley removed this morning and he has voided light pink urine without difficulty.  He was given perioperative antibiotics:  Anti-infectives (From admission, onward)    Start     Dose/Rate Route Frequency Ordered Stop   02/27/23 0816  ceFAZolin (ANCEF) IVPB 2g/100 mL premix        2 g 200 mL/hr over 30 Minutes Intravenous 30 min pre-op 02/27/23 0816 02/27/23 1023     .  He was given sequential compression devices for DVT prophylaxis.  He benefited maximally from the hospital stay and there were no complications.    Recent vital signs:  Vitals:   02/27/23 2257 02/28/23 0247  BP: (!) 144/96 (!)  148/99  Pulse: 86 84  Resp: 18 19  Temp: (!) 97.5 F (36.4 C) (!) 97.4 F (36.3 C)  SpO2: 95% 97%    Recent laboratory studies:  Lab Results  Component Value Date   HGB 17.0 02/19/2023   HGB 17.2 (H) 07/08/2022   HGB 14.3 08/26/2021   Lab Results  Component Value Date   WBC 9.7 02/19/2023   PLT 356 02/19/2023   Lab Results  Component Value Date   INR 1.0 08/12/2021   Lab Results  Component Value Date   NA 138 02/19/2023   K 3.9 02/19/2023   CL 104 02/19/2023   CO2 26 02/19/2023   BUN 22 02/19/2023   CREATININE 1.41 (H) 02/19/2023   GLUCOSE 87 02/19/2023    Discharge Medications:   Allergies as of 02/28/2023   No Known Allergies      Medication List     TAKE these medications    Advil 200 MG tablet Generic drug: ibuprofen Take 400 mg by mouth every 6 (six) hours as needed for mild pain or moderate pain.   amLODipine 5 MG tablet Commonly known as: NORVASC Take 5 mg by mouth daily.   aspirin EC 81 MG tablet Take 1 tablet (81 mg total) by mouth 2 (two) times daily after a meal. Day after surgery   b complex vitamins capsule Take 1 capsule by mouth daily.   hydrochlorothiazide 25 MG  tablet Commonly known as: HYDRODIURIL Take 25 mg by mouth daily.   losartan 100 MG tablet Commonly known as: COZAAR Take 100 mg by mouth daily.   losartan-hydrochlorothiazide 100-25 MG tablet Commonly known as: HYZAAR Take 1 tablet by mouth daily.   multivitamin with minerals tablet Take 1 tablet by mouth daily. Men   pantoprazole 40 MG tablet Commonly known as: PROTONIX Take 40 mg by mouth daily.   sildenafil 100 MG tablet Commonly known as: VIAGRA Take 100 mg by mouth daily as needed for erectile dysfunction.   tiZANidine 4 MG capsule Commonly known as: Zanaflex Take 1 capsule (4 mg total) by mouth 3 (three) times daily as needed for muscle spasms.   tiZANidine 4 MG tablet Commonly known as: ZANAFLEX Take 4 mg by mouth daily as needed for muscle  spasms.   tretinoin 0.1 % cream Commonly known as: RETIN-A Apply 1 application  topically at bedtime.   vitamin C 250 MG tablet Commonly known as: ASCORBIC ACID Take 500 mg by mouth daily.        Diagnostic Studies: DG C-Arm 1-60 Min-No Report  Result Date: 02/27/2023 Fluoroscopy was utilized by the requesting physician.  No radiographic interpretation.    Disposition: Discharge disposition: 01-Home or Self Care       Discharge Instructions     Discontinue IV   Complete by: As directed         Follow-up Information     [facility] Follow up on 03/13/2023.   Why: 2p Contact information: [address] [place] [address] [phone]                 Signed: Bjorn Pippin 02/28/2023, 7:41 AM

## 2023-05-29 NOTE — Progress Notes (Signed)
 Patient arrived to clinic ambulatory for follow up of polycythemia. Last therapeutic phlebotomy 02/07/2021. Weight and vitals obtained. 4 lb wt gain noted. BP elevated. Pt states he has taken his medication today. Patient states he thinks he needs some anxiety medication. Instructed patient to contact PCP.  Patient verbalized understanding. Denies pain. Patient had a TURP procedure for BPH with increased PSA on 02/27/2023. No other concerns noted. MD aware.   Will arrange any further tests and procedures ordered by provider. Will schedule any third party referrals, ordered by provider. Patient has an active MyChart.

## 2023-05-31 NOTE — Progress Notes (Signed)
 [facility]  Hematology Oncology Consultation  Followup Visit Note  Patient Name: Joshua Bowers Patient Age: 73 y.o. Encounter Date: 06/01/2023  Referring Physician:  Vicci Lauraine Stabs, ANP [address] [place],  [place] [address]  Consulting Provider: Mertie Been Letty Raddle., MD  Hematology/Oncology  Reason for Visit:  Follow-up polycythemia.   Assessment/Plan:  Mr. Joshua Bowers is a 73 year old gentleman referred to us  back in May 2021 for evaluation of polycythemia.  His hemoglobin was just about normal at 16.5 g/dL.  The JAK2 mutation was negative.  He does not have any history of smoking.   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  His ferritin was normal at 102 ng/mL.  No iron saturation was done at that point.  Reviewing his chart, the first iron saturation I found in his lab report is from January 2023 and it was normal at 17%. He had been receiving phlebotomies intermittently by that time.  I explained to the patient that he does not seem to have hereditary hemochromatosis.  He is only a carrier for the hemochromatosis gene mutations.   He has been treated with periodic phlebotomies to keep his hemoglobin/hematocrit in the normal range beginning in September 2021 when his hemoglobin was 18 g/dL.  I explained to the patient that he does not seem to have polycythemia vera because his JAK2 mutation was negative.  Experts do not recommend treating secondary polycythemia aggressively to keep the hemoglobin/hematocrit in the normal range.  They only recommend phlebotomies to improve patient's symptoms.  He seems to be completely asymptomatic.  He is convinced he needs phlebotomies to keep his hemoglobin/hematocrit in the normal range.    I have reviewed the laboratory, pathology, and radiology reports in detail and discussed findings with the  patient.  Interval History:   06/01/2023 - The patient is clinically stable. CBC will be done today and the office will call the patient with the results. He will remain under surveillance. He will return in 6 months with CBC and iron levels.   Review of Systems  Constitutional: Negative.   HENT:  Negative.    Eyes: Negative.   Respiratory: Negative.    Cardiovascular: Negative.   Gastrointestinal: Negative.   Endocrine: Negative.   Genitourinary: Negative.    Musculoskeletal: Negative.   Skin: Negative.   Hematological: Negative.     Vital signs for this encounter: BSA: 2.1 meters squared BP 170/103   Pulse 68   Temp 36.8 C (98.3 F) (Temporal)   Resp 16   Wt 87.9 kg (193 lb 12.8 oz)   SpO2 97%   BMI 27.03 kg/m   Physical Exam Constitutional:      Appearance: Normal appearance.  HENT:     Head: Normocephalic.     Mouth/Throat:     Mouth: Mucous membranes are moist.     Pharynx: Oropharynx is clear.  Eyes:     Conjunctiva/sclera: Conjunctivae normal.     Pupils: Pupils are equal, round, and reactive to light.  Pulmonary:     Breath sounds: Normal breath sounds.  Abdominal:     General: Abdomen is flat. Bowel sounds are normal.     Palpations: Abdomen is soft.  Musculoskeletal:        General: Normal range of motion.     Cervical back: Neck supple.  Skin:    General: Skin is warm.  Neurological:     General: No focal deficit present.     Mental Status: He is alert.     Karnofsky/Lansky Performance Status 90,  Able to carry on normal activity; minor signs or symptoms of disease (ECOG equivalent 0)   Results:  WBC  Date Value Ref Range Status  02/19/2023 10.1 4.0 - 10.5 10*9/L Final   HGB  Date Value Ref Range Status  02/19/2023 16.2 12.5 - 17.0 g/dL Final   HCT  Date Value Ref Range Status  02/19/2023 47.7 36.0 - 50.0 % Final   Platelet  Date Value Ref Range Status  02/19/2023 345 140 - 415 10*9/L Final   Creatinine  Date Value Ref Range  Status  04/17/2022 1.46 (H) 0.80 - 1.30 mg/dL Final   AST  Date Value Ref Range Status  04/17/2022 25 15 - 40 U/L Final   AFP-Tumor Marker  Date Value Ref Range Status  10/28/2019 4 <8 ng/mL Final

## 2023-06-01 NOTE — Telephone Encounter (Signed)
 Outgoing call to [name] to inform him that his HGB is in normal range per his therapy plan and per Dr Letty. [name] was very pleased and he has been given his follow up atppt for labs in 6 months.

## 2024-02-05 NOTE — Progress Notes (Signed)
 Cardiology Office Note   Date:  02/06/2024  ID:  Joshua Bowers, DOB Aug 04, 1950, MRN [id number] PCP: Maree Isles, MD  [facility] Providers Cardiologist:  Emeline FORBES Calender, MD     History of Present Illness Joshua Bowers is a 73 y.o. male with a past medical history of hypertension, asbestosis, CKD 3 who was referred by his PCP for symptoms of feeling sluggish.  Patient states that a friend of his recently had a coronary calcium  score done and then ended up needing intervention.  This made him nervous and had him go for a coronary calcium  score himself which was found to be elevated at 290 prompting him to be referred to cardiology.  Per prior notes, he had mentioned that he was fatigued however today he does not mention that he has been fatigued he does exercise regularly however not over the past month or 2 as he has been helping a family member but before that he exercised very frequently without any exertional symptoms.  He does say that he does get fatigued when exerting himself in the heat.  He does not get any exertional chest pain or shortness of breath.  No lower extremity edema.  No orthopnea or PND.  Reports a family history of cardiac disease in multiple family members  Tobacco use: Very distant tobacco use Family history: Family History  Problem Relation Age of Onset   Colon cancer Neg Hx      ROS:  Review of Systems  All other systems reviewed and are negative.   Physical Exam  Physical Exam Vitals and nursing note reviewed.  Constitutional:      Appearance: Normal appearance.  HENT:     Head: Normocephalic and atraumatic.  Eyes:     Conjunctiva/sclera: Conjunctivae normal.  Neck:     Vascular: No carotid bruit.  Cardiovascular:     Rate and Rhythm: Normal rate and regular rhythm.  Pulmonary:     Effort: Pulmonary effort is normal.     Breath sounds: Normal breath sounds.  Musculoskeletal:        General: No swelling or tenderness.  Skin:     Coloration: Skin is not jaundiced or pale.  Neurological:     Mental Status: He is alert.     VS:  BP (!) 141/90 (BP Location: Left Arm, Patient Position: Sitting, Cuff Size: Large)   Pulse 78   Ht 5' 7 (1.702 m)   Wt 182 lb (82.6 kg)   SpO2 95%   BMI 28.51 kg/m         Wt Readings from Last 3 Encounters:  02/06/24 182 lb (82.6 kg)  02/27/23 185 lb (83.9 kg)  02/19/23 185 lb (83.9 kg)     EKG Interpretation Date/Time:  Wednesday February 06 2024 10:48:07 EDT Ventricular Rate:  79 PR Interval:  172 QRS Duration:  84 QT Interval:  366 QTC Calculation: 419 R Axis:   -61  Text Interpretation: Normal sinus rhythm Possible Left atrial enlargement Left axis deviation Anteroseptal infarct (cited on or before 24-Dec-2015) When compared with ECG of 19-Feb-2023 13:24, No significant change since Confirmed by Calender Emeline [phone]) on 02/06/2024 10:52:19 AM    Studies Reviewed   Coronary calcium  score at [facility] 12/13/2023: 290 with calcium  noted in LAD and circumflex     Risk Assessment/Calculations   ASSESSMENT  Fatigue reported on prior notes but patient states this is related to being in the heat and does not seem to be related to exertion  Elevated coronary artery calcium  score 290 at [facility] notable in the LAD and circumflex Hypertension he is on amlodipine  5 mg, HCTZ 25 mg, losartan  100 mg. Aortic calcification noted on coronary calcium  score   Plan  Echo PET stress test to evaluate for obstructive coronary disease Patient is to call in 2 weeks with blood pressure recordings and if they are still elevated at that time then we can consider increasing his amlodipine  Continue aspirin  Start rosuvastatin  20 mg Lipid panel Cardiac risk counseling and prevention recommendations: Heart healthy/Mediterranean diet with whole grains, fruits, vegetable, fish, lean meats, nuts, and olive oil. Limit salt. Moderate walking, 3-5 times/week for 30-50 minutes each session. Aim for  at least 150 minutes.week. Goal should be pace of 3 miles/hour, or walking 1.5 miles in 30 minutes Avoidance of tobacco products. Avoid excess alcohol.  Follow up: 3 months     Informed Consent   Shared Decision Making/Informed Consent The risks [chest pain, shortness of breath, cardiac arrhythmias, dizziness, blood pressure fluctuations, myocardial infarction, stroke/transient ischemic attack, nausea, vomiting, allergic reaction, radiation exposure, metallic taste sensation and life-threatening complications (estimated to be 1 in 10,000)], benefits (risk stratification, diagnosing coronary artery disease, treatment guidance) and alternatives of a cardiac PET stress test were discussed in detail with Mr. [name] and he agrees to proceed.       Signed, Emeline FORBES Calender, MD

## 2024-02-06 ENCOUNTER — Ambulatory Visit: Attending: Internal Medicine | Admitting: Internal Medicine

## 2024-02-06 ENCOUNTER — Encounter: Payer: Self-pay | Admitting: Internal Medicine

## 2024-02-06 ENCOUNTER — Telehealth: Payer: Self-pay | Admitting: Internal Medicine

## 2024-02-06 VITALS — BP 141/90 | HR 78 | Ht 67.0 in | Wt 182.0 lb

## 2024-02-06 DIAGNOSIS — I25119 Atherosclerotic heart disease of native coronary artery with unspecified angina pectoris: Secondary | ICD-10-CM | POA: Diagnosis present

## 2024-02-06 DIAGNOSIS — R931 Abnormal findings on diagnostic imaging of heart and coronary circulation: Secondary | ICD-10-CM | POA: Insufficient documentation

## 2024-02-06 DIAGNOSIS — Z87891 Personal history of nicotine dependence: Secondary | ICD-10-CM | POA: Insufficient documentation

## 2024-02-06 DIAGNOSIS — N183 Chronic kidney disease, stage 3 unspecified: Secondary | ICD-10-CM | POA: Insufficient documentation

## 2024-02-06 DIAGNOSIS — I700 Atherosclerosis of aorta: Secondary | ICD-10-CM | POA: Diagnosis not present

## 2024-02-06 DIAGNOSIS — Z8249 Family history of ischemic heart disease and other diseases of the circulatory system: Secondary | ICD-10-CM | POA: Diagnosis not present

## 2024-02-06 DIAGNOSIS — J61 Pneumoconiosis due to asbestos and other mineral fibers: Secondary | ICD-10-CM | POA: Insufficient documentation

## 2024-02-06 DIAGNOSIS — I129 Hypertensive chronic kidney disease with stage 1 through stage 4 chronic kidney disease, or unspecified chronic kidney disease: Secondary | ICD-10-CM | POA: Insufficient documentation

## 2024-02-06 DIAGNOSIS — I10 Essential (primary) hypertension: Secondary | ICD-10-CM

## 2024-02-06 DIAGNOSIS — R5383 Other fatigue: Secondary | ICD-10-CM | POA: Insufficient documentation

## 2024-02-06 MED ORDER — ROSUVASTATIN CALCIUM 20 MG PO TABS
20.0000 mg | ORAL_TABLET | Freq: Every day | ORAL | 3 refills | Status: AC
Start: 2024-02-06 — End: 2024-05-06

## 2024-02-06 NOTE — Telephone Encounter (Signed)
 Pt wanted to make doctor aware he has hemochromatosis. He feels this is pertinent to his care. Please advise.

## 2024-02-06 NOTE — Patient Instructions (Signed)
 Medication Instructions:  No medication changes were made at this visit. Continue current regimen.   Testing/Procedures: Your physician has requested that you have an echocardiogram. Echocardiography is a painless test that uses sound waves to create images of your heart. It provides your doctor with information about the size and shape of your heart and how well your heart's chambers and valves are working. This procedure takes approximately one hour. There are no restrictions for this procedure. Please do NOT wear cologne, perfume, aftershave, or lotions (deodorant is allowed). Please arrive 15 minutes prior to your appointment time.  Please note: We ask at that you not bring children with you during ultrasound (echo/ vascular) testing. Due to room size and safety concerns, children are not allowed in the ultrasound rooms during exams. Our front office staff cannot provide observation of children in our lobby area while testing is being conducted. An adult accompanying a patient to their appointment will only be allowed in the ultrasound room at the discretion of the ultrasound technician under special circumstances. We apologize for any inconvenience.   How to Prepare for Your Cardiac PET/CT Stress Test:  1. Please do not take these medications before your test:  ~Medications that may interfere with the cardiac pharmacological stress agent (ex. nitrates - including erectile dysfunction medications, isosorbide mononitrate, tamulosin or beta-blockers) the day of the exam. (Erectile dysfunction medication should be held for at least 72 hrs prior to test) ~Theophylline containing medications for 12 hours. ~Dipyridamole 48 hours prior to the test. ~Your remaining medications may be taken with water .  2. Nothing to eat or drink, except water , 3 hours prior to arrival time.   ~ NO caffeine/decaffeinated products, or chocolate 12 hours prior to arrival.  3. NO perfume, cologne or lotion on chest or  abdomen area.         - FEMALES - Please avoid wearing dresses to this appointment.  4. Total time is 1 to 2 hours; you may want to bring reading material for the waiting time.  Please report to Radiology at the [facility] Main Entrance 30 minutes early for your test. [address] [place], [place] [address]  Diabetic Preparation: - Hold oral medications. - You may take NPH and Lantus insulin. - Do not take Humalog or Humulin R (Regular Insulin) the day of your test. - Check blood sugars prior to leaving the house. - If able to eat breakfast prior to 3 hour fasting, you may take all medications, including your insulin, - Do not worry if you miss your breakfast dose of insulin - start at your next meal. - Patients who wear a continuous glucose monitor MUST remove the device prior to scanning.  In preparation for your appointment, medication and supplies will be purchased.  Appointment availability is limited, so if you need to cancel or reschedule, please call the Radiology Department at [phone] Geroge Law) OR [phone] [facility])  24 hours in advance to avoid a cancellation fee of $100.00  What to Expect After you Arrive:  Once you arrive and check in for your appointment, you will be taken to a preparation room within the Radiology Department.  A technologist or Nurse will obtain your medical history, verify that you are correctly prepped for the exam, and explain the procedure.  Afterwards,  an IV will be started in your arm and electrodes will be placed on your skin for EKG monitoring during the stress portion of the exam. Then you will be escorted to the  PET/CT scanner.  There, staff will get you positioned on the scanner and obtain a blood pressure and EKG.  During the exam, you will continue to be connected to the EKG and blood pressure machines.  A small, safe amount of a radioactive tracer will be injected in your IV to obtain a series of pictures of your heart along  with an injection of a stress agent.    After your Exam:  It is recommended that you eat a meal and drink a caffeinated beverage to counter act any effects of the stress agent.  Drink plenty of fluids for the remainder of the day and urinate frequently for the first couple of hours after the exam.  Your doctor will inform you of your test results within 7-10 business days.  For more information and frequently asked questions, please visit our website : [email or web address]  For questions about your test or how to prepare for your test, please call: Cardiac Imaging Nurse Navigators Office: [phone]    Follow-Up: At [facility], you and your health needs are our priority.  As part of our continuing mission to provide you with exceptional heart care, our providers are all part of one team.  This team includes your primary Cardiologist (physician) and Advanced Practice Providers or APPs (Physician Assistants and Nurse Practitioners) who all work together to provide you with the care you need, when you need it.  Your next appointment:   3 month(s)  Provider:   Emeline FORBES Calender, MD    We recommend signing up for the patient portal called MyChart.  Sign up information is provided on this After Visit Summary.  MyChart is used to connect with patients for Virtual Visits (Telemedicine).  Patients are able to view lab/test results, encounter notes, upcoming appointments, etc.  Non-urgent messages can be sent to your provider as well.   To learn more about what you can do with MyChart, go to [email or web address].

## 2024-02-06 NOTE — Telephone Encounter (Signed)
I will forward to MD

## 2024-02-06 NOTE — Plan of Care (Signed)
 Dr. Kriste ordered a lipid panel and prescribed Rosuvastatin  after the patient left his office visit. I was able to call the patient and verbally explain instructions to him. He was receptive and said he would get his blood work done and pick up his new prescription today.

## 2024-02-07 ENCOUNTER — Ambulatory Visit: Payer: Self-pay | Admitting: Internal Medicine

## 2024-02-07 DIAGNOSIS — R931 Abnormal findings on diagnostic imaging of heart and coronary circulation: Secondary | ICD-10-CM

## 2024-02-07 DIAGNOSIS — I700 Atherosclerosis of aorta: Secondary | ICD-10-CM

## 2024-02-07 DIAGNOSIS — E781 Pure hyperglyceridemia: Secondary | ICD-10-CM

## 2024-02-07 DIAGNOSIS — I25119 Atherosclerotic heart disease of native coronary artery with unspecified angina pectoris: Secondary | ICD-10-CM

## 2024-02-07 LAB — LIPID PANEL
Chol/HDL Ratio: 5.4 ratio — ABNORMAL HIGH (ref 0.0–5.0)
Cholesterol, Total: 174 mg/dL (ref 100–199)
HDL: 32 mg/dL — ABNORMAL LOW (ref >39)
LDL Chol Calc (NIH): 71 mg/dL (ref 0–99)
Triglycerides: 449 mg/dL — ABNORMAL HIGH (ref 0–149)
VLDL Cholesterol Cal: 71 mg/dL — ABNORMAL HIGH (ref 5–40)

## 2024-02-07 NOTE — Telephone Encounter (Signed)
 I called the patient to discuss the results of his lipid panel.  His triglycerides are elevated at 449 however he was not fasting.  He started the rosuvastatin  this morning.  I will order repeat lipid panel in 3 months as well as an LP(a) to follow-up on response to statin.

## 2024-02-14 IMAGING — DX DG KNEE 1-2V*R*
2 series · 2 of 2 positions shown · non-contrast
Comparison: Radiographs 07/14/2020

CLINICAL DATA: Status post total knee arthroplasty.

EXAM:
RIGHT KNEE - 1-2 VIEW

[knee ap]
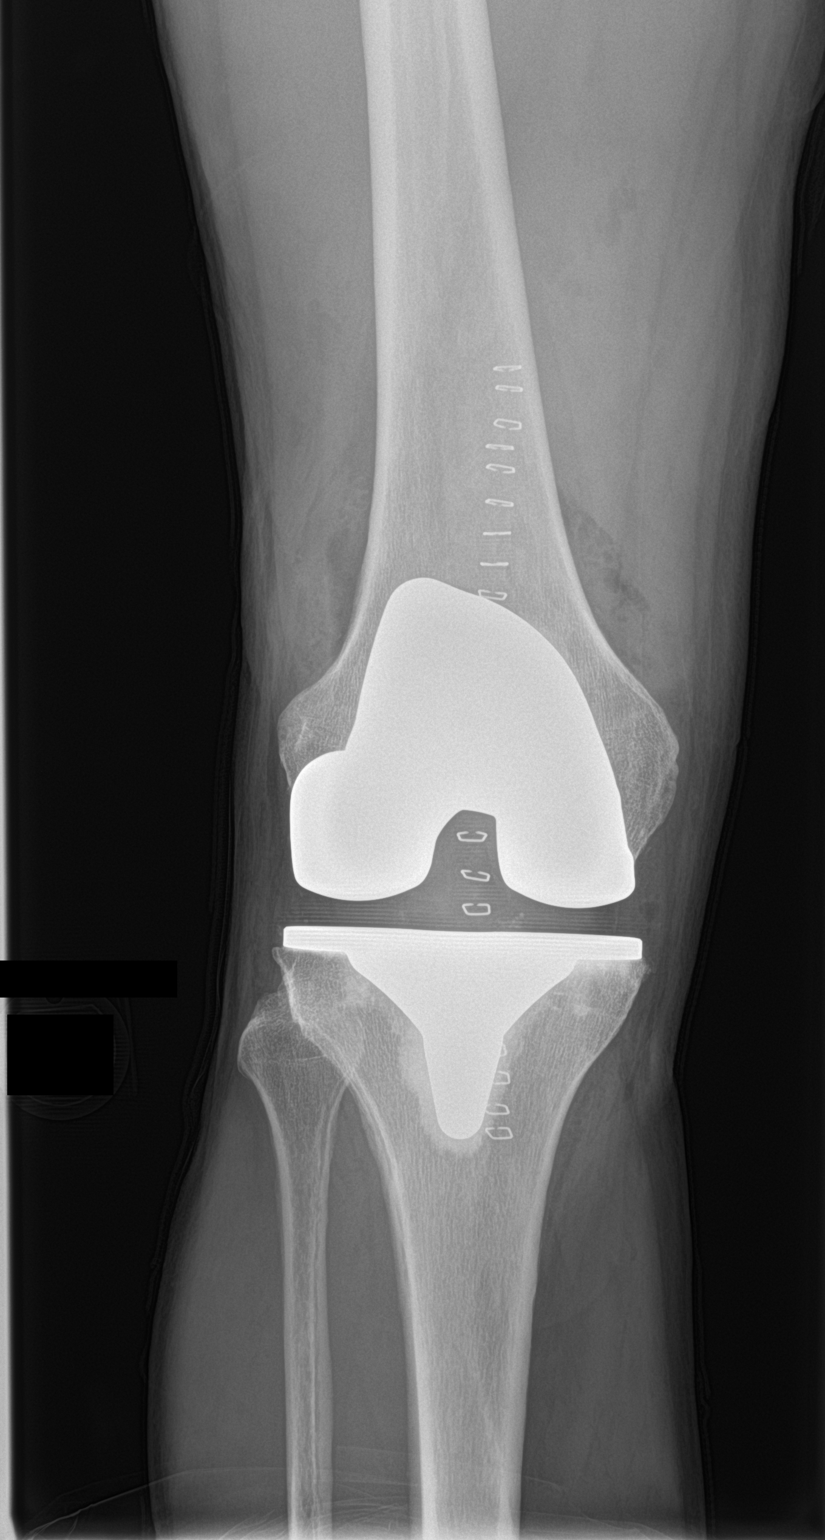

[knee lat]
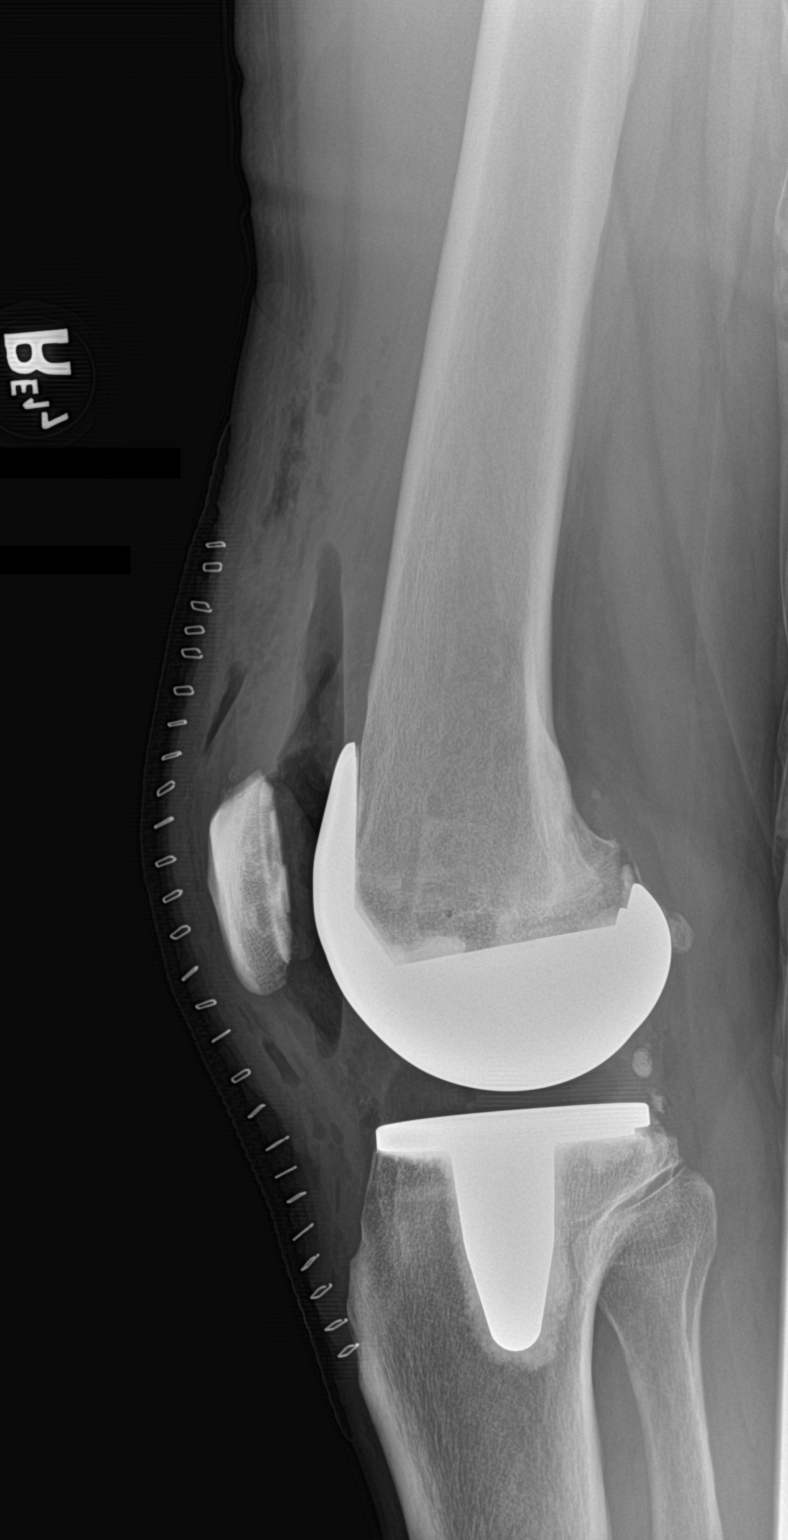

[2 of 2 positions shown; findings below may reference images not displayed]

FINDINGS: Well seated components of a total right knee arthroplasty are noted.
No complicating features are identified.
IMPRESSION: Well seated components of a total right knee arthroplasty.

## 2024-02-15 ENCOUNTER — Encounter (HOSPITAL_COMMUNITY): Payer: Self-pay

## 2024-02-19 ENCOUNTER — Encounter (HOSPITAL_COMMUNITY)
Admission: RE | Admit: 2024-02-19 | Discharge: 2024-02-19 | Disposition: A | Discharge status: Home / Self Care | Source: Ambulatory Visit | Attending: Internal Medicine | Admitting: Internal Medicine

## 2024-02-19 DIAGNOSIS — R5383 Other fatigue: Secondary | ICD-10-CM | POA: Insufficient documentation

## 2024-02-19 DIAGNOSIS — I25119 Atherosclerotic heart disease of native coronary artery with unspecified angina pectoris: Secondary | ICD-10-CM | POA: Insufficient documentation

## 2024-02-19 LAB — NM PET CT CARDIAC PERFUSION MULTI W/ABSOLUTE BLOODFLOW
LV dias vol: 95 mL (ref 62–150)
LV sys vol: 45 mL (ref 4.2–5.8)
MBFR: 3.77
Nuc Rest EF: 53 %
Nuc Stress EF: 59 %
Rest MBF: 0.82 ml/g/min
Rest Nuclear Isotope Dose: 21.3 mCi
ST Depression (mm): 0 mm
Stress MBF: 3.09 ml/g/min
Stress Nuclear Isotope Dose: 21.3 mCi

## 2024-02-19 MED ORDER — RUBIDIUM RB82 GENERATOR (RUBYFILL)
21.3200 | PACK | Freq: Once | INTRAVENOUS | Status: AC
  Administered 2024-02-19: 21.32 via INTRAVENOUS

## 2024-02-19 MED ORDER — REGADENOSON 0.4 MG/5ML IV SOLN
INTRAVENOUS | Status: AC
  Filled 2024-02-19: qty 5

## 2024-02-19 MED ORDER — REGADENOSON 0.4 MG/5ML IV SOLN
0.4000 mg | Freq: Once | INTRAVENOUS | Status: AC
  Administered 2024-02-19: 0.4 mg via INTRAVENOUS

## 2024-02-19 NOTE — Progress Notes (Signed)
 Pt tolerated lexiscan. Endorsed having stomach cramps, resolved by end of scan. Given PO caffeine. All questions answered by this RN and understanding verbalized.

## 2024-03-05 ENCOUNTER — Ambulatory Visit (HOSPITAL_COMMUNITY)
Admission: RE | Admit: 2024-03-05 | Discharge: 2024-03-05 | Disposition: A | Discharge status: Home / Self Care | Source: Ambulatory Visit | Attending: Cardiovascular Disease | Admitting: Cardiovascular Disease

## 2024-03-05 DIAGNOSIS — R5383 Other fatigue: Secondary | ICD-10-CM | POA: Insufficient documentation

## 2024-03-05 DIAGNOSIS — I25119 Atherosclerotic heart disease of native coronary artery with unspecified angina pectoris: Secondary | ICD-10-CM | POA: Insufficient documentation

## 2024-03-05 LAB — ECHOCARDIOGRAM COMPLETE
AR max vel: 2.27 cm2
AV Area VTI: 2.21 cm2
AV Area mean vel: 2.17 cm2
AV Mean grad: 3 mmHg
AV Peak grad: 6 mmHg
Ao pk vel: 1.22 m/s
Area-P 1/2: 3.46 cm2
MV M vel: 1.95 m/s
MV Peak grad: 15.2 mmHg
S' Lateral: 2.8 cm

## 2024-04-22 ENCOUNTER — Encounter: Payer: Self-pay | Admitting: *Deleted

## 2024-04-23 ENCOUNTER — Encounter: Payer: Self-pay | Admitting: Internal Medicine

## 2024-04-23 ENCOUNTER — Ambulatory Visit: Attending: Cardiology | Admitting: Internal Medicine

## 2024-04-23 VITALS — BP 123/64 | HR 68 | Ht 72.0 in | Wt 196.2 lb

## 2024-04-23 DIAGNOSIS — E782 Mixed hyperlipidemia: Secondary | ICD-10-CM | POA: Diagnosis not present

## 2024-04-23 DIAGNOSIS — I5189 Other ill-defined heart diseases: Secondary | ICD-10-CM | POA: Insufficient documentation

## 2024-04-23 DIAGNOSIS — I2510 Atherosclerotic heart disease of native coronary artery without angina pectoris: Secondary | ICD-10-CM | POA: Diagnosis present

## 2024-04-23 DIAGNOSIS — R5383 Other fatigue: Secondary | ICD-10-CM | POA: Diagnosis present

## 2024-04-23 DIAGNOSIS — I10 Essential (primary) hypertension: Secondary | ICD-10-CM | POA: Diagnosis not present

## 2024-04-23 DIAGNOSIS — E785 Hyperlipidemia, unspecified: Secondary | ICD-10-CM | POA: Insufficient documentation

## 2024-04-23 DIAGNOSIS — I700 Atherosclerosis of aorta: Secondary | ICD-10-CM | POA: Diagnosis present

## 2024-04-23 DIAGNOSIS — R931 Abnormal findings on diagnostic imaging of heart and coronary circulation: Secondary | ICD-10-CM | POA: Diagnosis present

## 2024-04-23 NOTE — Patient Instructions (Signed)
 Medication Instructions:  No medication changes were made at this visit. Continue current regimen.  *If you need a refill on your cardiac medications before your next appointment, please call your pharmacy*  Lab Work: TSH today If you have labs (blood work) drawn today and your tests are completely normal, you will receive your results only by: MyChart Message (if you have MyChart) OR A paper copy in the mail If you have any lab test that is abnormal or we need to change your treatment, we will call you to review the results.  Follow-Up: At [facility], you and your health needs are our priority.  As part of our continuing mission to provide you with exceptional heart care, our providers are all part of one team.  This team includes your primary Cardiologist (physician) and Advanced Practice Providers or APPs (Physician Assistants and Nurse Practitioners) who all work together to provide you with the care you need, when you need it.  Your next appointment:   1 year(s)  Provider:   Emeline FORBES Calender, DO    We recommend signing up for the patient portal called MyChart.  Sign up information is provided on this After Visit Summary.  MyChart is used to connect with patients for Virtual Visits (Telemedicine).  Patients are able to view lab/test results, encounter notes, upcoming appointments, etc.  Non-urgent messages can be sent to your provider as well.   To learn more about what you can do with MyChart, go to [email or web address].

## 2024-04-23 NOTE — Progress Notes (Signed)
 Cardiology Office Note   Date:  04/23/2024  ID:  Taiwan, Talcott [birth year], MRN [id number] PCP: Maree Isles, MD  [facility] Providers Cardiologist:  Emeline FORBES Calender, DO     History of Present Illness Joshua Bowers is a 73 y.o. male with a past medical history of hypertension, asbestosis, CKD 3 who was referred by his PCP for symptoms of feeling sluggish.  At his last visit with me on 02/06/2024 he stated that a friend of his recently had a coronary calcium  score done and then ended up needing intervention.  This made him nervous and had him go for a coronary calcium  score himself which was found to be elevated at 290 prompting him to be referred to cardiology. He had noted fatigue when exerting himself in the heat.  He did not not get any exertional chest pain or shortness of breath.  No lower extremity edema.  No orthopnea or PND.  Reports a family history of cardiac disease in multiple family members.  He underwent an echocardiogram and PET stress test and was started on rosuvastatin .  He is doing well overall without any chest pain or shortness of breath.  He is still having fatigue.  Denies any snoring at night.  Otherwise no complaints.  Tobacco use: Very distant tobacco use Family history: Family History  Problem Relation Age of Onset   Colon cancer Neg Hx      ROS:  Review of Systems  All other systems reviewed and are negative.   Physical Exam  Physical Exam Vitals and nursing note reviewed.  Constitutional:      Appearance: Normal appearance.  HENT:     Head: Normocephalic and atraumatic.  Eyes:     Conjunctiva/sclera: Conjunctivae normal.  Cardiovascular:     Rate and Rhythm: Normal rate and regular rhythm.  Pulmonary:     Effort: Pulmonary effort is normal.     Breath sounds: Normal breath sounds.  Musculoskeletal:        General: No swelling or tenderness.  Skin:    Coloration: Skin is not jaundiced or pale.  Neurological:     Mental  Status: He is alert.     VS:  BP 123/64   Pulse 68   Ht 6' (1.829 m)   Wt 196 lb 3.2 oz (89 kg)   SpO2 96%   BMI 26.61 kg/m         Wt Readings from Last 3 Encounters:  04/23/24 196 lb 3.2 oz (89 kg)  02/06/24 182 lb (82.6 kg)  02/27/23 185 lb (83.9 kg)     EKG Interpretation Date/Time:    Ventricular Rate:    PR Interval:    QRS Duration:    QT Interval:    QTC Calculation:   R Axis:      Text Interpretation:      Studies Reviewed   Coronary calcium  score at [facility] 12/13/2023: 290 with calcium  noted in LAD and circumflex  Echocardiogram 03/05/2024:  1. Left ventricular ejection fraction, by estimation, is 50 to 55%. Left  ventricular ejection fraction by 3D volume is 50 %. The left ventricle has  low normal function. The left ventricle demonstrates global hypokinesis.  Left ventricular diastolic  parameters are consistent with Grade I diastolic dysfunction (impaired  relaxation). The average left ventricular global longitudinal strain is  -17.3 %. The global longitudinal strain is abnormal.   2. Right ventricular systolic function is normal. The right ventricular  size is normal. There is  normal pulmonary artery systolic pressure. The  estimated right ventricular systolic pressure is 9.9 mmHg.   3. The mitral valve is normal in structure. Trivial mitral valve  regurgitation. No evidence of mitral stenosis.   4. The aortic valve is tricuspid. Aortic valve regurgitation is not  visualized. Aortic valve sclerosis is present, with no evidence of aortic  valve stenosis.   5. The inferior vena cava is normal in size with greater than 50%  respiratory variability, suggesting right atrial pressure of 3 mmHg.   Comparison(s): No prior Echocardiogram.    Cardiac PET CT 02/19/2024:    LV perfusion is normal. There is no evidence of ischemia. There is no evidence of infarction.   Rest left ventricular function is normal. Rest EF: 53%. Stress left ventricular function  is normal. Stress EF: 59%. End diastolic cavity size is normal. End systolic cavity size is normal.   Myocardial blood flow was computed to be 0.52ml/g/min at rest and 3.09ml/g/min at stress. Global myocardial blood flow reserve was 3.77 and was normal.   Coronary calcium  was present on the attenuation correction CT images. Moderate coronary calcifications were present. Coronary calcifications were present in the left anterior descending artery distribution(s).   The study is normal. The study is low risk.  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 Dyslipidemia Aortic calcification noted on coronary calcium  score Grade 1 diastolic dysfunction   Plan  Continue aspirin  and rosuvastatin  Will check a TSH today given his fatigue.  He is declining a sleep study at this time, will defer to PCP  Follow up: 1 year        Signed, Emeline FORBES Calender, DO

## 2024-04-24 ENCOUNTER — Ambulatory Visit: Payer: Self-pay | Admitting: Internal Medicine

## 2024-04-24 LAB — TSH: TSH: 1.65 u[IU]/mL (ref 0.450–4.500)
